# Patient Record
Sex: Female | Born: 1965 | Race: Black or African American | Hispanic: No | Marital: Single | Smoking: Current every day smoker
Health system: Southern US, Community
[De-identification: ages and names within clinical notes are randomized; demographics above are authoritative.]

## PROBLEM LIST (undated history)

## (undated) DIAGNOSIS — D869 Sarcoidosis, unspecified: Secondary | ICD-10-CM

## (undated) DIAGNOSIS — Z972 Presence of dental prosthetic device (complete) (partial): Secondary | ICD-10-CM

## (undated) DIAGNOSIS — R0602 Shortness of breath: Secondary | ICD-10-CM

## (undated) DIAGNOSIS — D860 Sarcoidosis of lung: Secondary | ICD-10-CM

## (undated) DIAGNOSIS — R002 Palpitations: Secondary | ICD-10-CM

## (undated) DIAGNOSIS — K219 Gastro-esophageal reflux disease without esophagitis: Secondary | ICD-10-CM

## (undated) DIAGNOSIS — E669 Obesity, unspecified: Secondary | ICD-10-CM

## (undated) DIAGNOSIS — K5900 Constipation, unspecified: Secondary | ICD-10-CM

## (undated) DIAGNOSIS — M1990 Unspecified osteoarthritis, unspecified site: Secondary | ICD-10-CM

## (undated) DIAGNOSIS — R600 Localized edema: Secondary | ICD-10-CM

## (undated) DIAGNOSIS — F32A Depression, unspecified: Secondary | ICD-10-CM

## (undated) DIAGNOSIS — J40 Bronchitis, not specified as acute or chronic: Secondary | ICD-10-CM

## (undated) DIAGNOSIS — J439 Emphysema, unspecified: Secondary | ICD-10-CM

## (undated) DIAGNOSIS — I10 Essential (primary) hypertension: Secondary | ICD-10-CM

## (undated) DIAGNOSIS — F329 Major depressive disorder, single episode, unspecified: Secondary | ICD-10-CM

## (undated) DIAGNOSIS — L732 Hidradenitis suppurativa: Secondary | ICD-10-CM

## (undated) DIAGNOSIS — K0889 Other specified disorders of teeth and supporting structures: Secondary | ICD-10-CM

## (undated) DIAGNOSIS — M549 Dorsalgia, unspecified: Secondary | ICD-10-CM

## (undated) DIAGNOSIS — J45909 Unspecified asthma, uncomplicated: Secondary | ICD-10-CM

## (undated) DIAGNOSIS — R112 Nausea with vomiting, unspecified: Secondary | ICD-10-CM

## (undated) DIAGNOSIS — L910 Hypertrophic scar: Secondary | ICD-10-CM

## (undated) DIAGNOSIS — M2550 Pain in unspecified joint: Secondary | ICD-10-CM

## (undated) DIAGNOSIS — E785 Hyperlipidemia, unspecified: Secondary | ICD-10-CM

## (undated) DIAGNOSIS — F419 Anxiety disorder, unspecified: Secondary | ICD-10-CM

## (undated) DIAGNOSIS — J449 Chronic obstructive pulmonary disease, unspecified: Secondary | ICD-10-CM

## (undated) DIAGNOSIS — G5600 Carpal tunnel syndrome, unspecified upper limb: Secondary | ICD-10-CM

## (undated) DIAGNOSIS — R1310 Dysphagia, unspecified: Secondary | ICD-10-CM

## (undated) DIAGNOSIS — R7303 Prediabetes: Secondary | ICD-10-CM

## (undated) DIAGNOSIS — Z9889 Other specified postprocedural states: Secondary | ICD-10-CM

## (undated) HISTORY — DX: Pain in unspecified joint: M25.50

## (undated) HISTORY — DX: Carpal tunnel syndrome, unspecified upper limb: G56.00

## (undated) HISTORY — DX: Gastro-esophageal reflux disease without esophagitis: K21.9

## (undated) HISTORY — DX: Major depressive disorder, single episode, unspecified: F32.9

## (undated) HISTORY — DX: Unspecified osteoarthritis, unspecified site: M19.90

## (undated) HISTORY — DX: Prediabetes: R73.03

## (undated) HISTORY — DX: Dorsalgia, unspecified: M54.9

## (undated) HISTORY — DX: Unspecified asthma, uncomplicated: J45.909

## (undated) HISTORY — DX: Bronchitis, not specified as acute or chronic: J40

## (undated) HISTORY — DX: Emphysema, unspecified: J43.9

## (undated) HISTORY — DX: Shortness of breath: R06.02

## (undated) HISTORY — DX: Sarcoidosis, unspecified: D86.9

## (undated) HISTORY — DX: Essential (primary) hypertension: I10

## (undated) HISTORY — DX: Hidradenitis suppurativa: L73.2

## (undated) HISTORY — DX: Constipation, unspecified: K59.00

## (undated) HISTORY — DX: Hyperlipidemia, unspecified: E78.5

## (undated) HISTORY — DX: Other specified disorders of teeth and supporting structures: K08.89

## (undated) HISTORY — PX: OTHER SURGICAL HISTORY: SHX169

## (undated) HISTORY — DX: Palpitations: R00.2

## (undated) HISTORY — DX: Localized edema: R60.0

## (undated) HISTORY — DX: Dysphagia, unspecified: R13.10

## (undated) HISTORY — DX: Sarcoidosis of lung: D86.0

## (undated) HISTORY — DX: Chronic obstructive pulmonary disease, unspecified: J44.9

## (undated) HISTORY — DX: Anxiety disorder, unspecified: F41.9

## (undated) HISTORY — DX: Hypertrophic scar: L91.0

## (undated) HISTORY — DX: Obesity, unspecified: E66.9

---

## 1998-07-18 ENCOUNTER — Emergency Department (HOSPITAL_COMMUNITY): Admission: EM | Admit: 1998-07-18 | Discharge: 1998-07-18 | Payer: Self-pay

## 2001-03-20 ENCOUNTER — Ambulatory Visit (HOSPITAL_COMMUNITY): Admission: RE | Admit: 2001-03-20 | Discharge: 2001-03-20 | Payer: Self-pay | Admitting: Family Medicine

## 2001-03-20 ENCOUNTER — Encounter: Payer: Self-pay | Admitting: Family Medicine

## 2001-04-04 ENCOUNTER — Encounter: Payer: Self-pay | Admitting: Neurology

## 2001-04-04 ENCOUNTER — Ambulatory Visit (HOSPITAL_COMMUNITY): Admission: RE | Admit: 2001-04-04 | Discharge: 2001-04-04 | Payer: Self-pay | Admitting: Neurology

## 2001-06-17 ENCOUNTER — Ambulatory Visit (HOSPITAL_COMMUNITY): Admission: RE | Admit: 2001-06-17 | Discharge: 2001-06-17 | Payer: Self-pay | Admitting: Neurology

## 2001-06-20 ENCOUNTER — Encounter: Admission: RE | Admit: 2001-06-20 | Discharge: 2001-09-18 | Payer: Self-pay | Admitting: Neurology

## 2001-06-24 ENCOUNTER — Ambulatory Visit (HOSPITAL_COMMUNITY): Admission: RE | Admit: 2001-06-24 | Discharge: 2001-06-24 | Payer: Self-pay | Admitting: Neurology

## 2001-06-24 ENCOUNTER — Encounter: Payer: Self-pay | Admitting: Neurology

## 2002-02-06 ENCOUNTER — Encounter: Payer: Self-pay | Admitting: Internal Medicine

## 2002-02-06 ENCOUNTER — Ambulatory Visit (HOSPITAL_COMMUNITY): Admission: RE | Admit: 2002-02-06 | Discharge: 2002-02-06 | Payer: Self-pay | Admitting: Family Medicine

## 2004-07-05 ENCOUNTER — Encounter: Admission: RE | Admit: 2004-07-05 | Discharge: 2004-07-05 | Payer: Self-pay | Admitting: Internal Medicine

## 2004-07-07 ENCOUNTER — Emergency Department (HOSPITAL_COMMUNITY): Admission: EM | Admit: 2004-07-07 | Discharge: 2004-07-07 | Payer: Self-pay | Admitting: Emergency Medicine

## 2005-10-23 ENCOUNTER — Emergency Department (HOSPITAL_COMMUNITY): Admission: EM | Admit: 2005-10-23 | Discharge: 2005-10-23 | Payer: Self-pay | Admitting: Emergency Medicine

## 2007-05-20 ENCOUNTER — Emergency Department (HOSPITAL_COMMUNITY): Admission: EM | Admit: 2007-05-20 | Discharge: 2007-05-20 | Payer: Self-pay | Admitting: Emergency Medicine

## 2007-07-10 ENCOUNTER — Ambulatory Visit (HOSPITAL_COMMUNITY): Admission: RE | Admit: 2007-07-10 | Discharge: 2007-07-10 | Payer: Self-pay | Admitting: *Deleted

## 2008-10-30 HISTORY — PX: MULTIPLE TOOTH EXTRACTIONS: SHX2053

## 2008-10-30 HISTORY — PX: OTHER SURGICAL HISTORY: SHX169

## 2009-06-29 ENCOUNTER — Encounter (INDEPENDENT_AMBULATORY_CARE_PROVIDER_SITE_OTHER): Payer: Self-pay | Admitting: Otolaryngology

## 2009-06-29 ENCOUNTER — Ambulatory Visit (HOSPITAL_BASED_OUTPATIENT_CLINIC_OR_DEPARTMENT_OTHER): Admission: RE | Admit: 2009-06-29 | Discharge: 2009-06-29 | Payer: Self-pay | Admitting: Otolaryngology

## 2009-06-29 HISTORY — PX: OPEN REDUCTION NASAL FRACTURE: SHX2105

## 2009-06-29 HISTORY — PX: NASAL TURBINATE REDUCTION: SHX2072

## 2010-06-22 ENCOUNTER — Encounter: Admission: RE | Admit: 2010-06-22 | Discharge: 2010-06-22 | Payer: Self-pay | Admitting: Internal Medicine

## 2010-07-13 ENCOUNTER — Encounter: Admission: RE | Admit: 2010-07-13 | Discharge: 2010-07-13 | Payer: Self-pay | Admitting: Internal Medicine

## 2010-11-20 ENCOUNTER — Encounter: Payer: Self-pay | Admitting: Internal Medicine

## 2011-02-04 LAB — BASIC METABOLIC PANEL
Calcium: 8.7 mg/dL (ref 8.4–10.5)
Creatinine, Ser: 1.14 mg/dL (ref 0.4–1.2)
GFR calc Af Amer: >60 mL/min (ref >60)
Sodium: 134 mEq/L — ABNORMAL LOW (ref 135–145)

## 2011-02-04 LAB — POCT HEMOGLOBIN-HEMACUE: Hemoglobin: 11.3 g/dL — ABNORMAL LOW (ref 12.0–15.0)

## 2011-03-14 NOTE — Op Note (Signed)
[name], [name]               ACCOUNT NO.:  [id number]   MEDICAL RECORD NO.:  [id number]          PATIENT TYPE:  AMB   LOCATION:  DSC                          FACILITY:  [facility]   PHYSICIAN:  Christopher E. Ezzard Standing, M.D.DATE OF BIRTH:  09-05-1966   DATE OF PROCEDURE:  06/29/2009  DATE OF DISCHARGE:                               OPERATIVE REPORT   PREOPERATIVE DIAGNOSES:  1. History of nasal fracture with nasal septal deformity and nasal      obstruction.  2. Left nasal cavity papilloma.   OPERATION PERFORMED:  1. Excisional biopsy of left intranasal papilloma.  2. Open reduction of nasal septal fracture and bilateral inferior      turbinate reductions.   ANESTHESIA:  General endotracheal.   SURGEON:  Kristine Garbe. Ezzard Standing, MD   COMPLICATIONS:  None.   CLINICAL NOTE:  Kaitlyn [name] is a 46 year old female who has had  history of nasal fracture over 15 years ago.  She has had a chronic  nasal obstruction but is concerned about a nodule in the left nasal  cavity that she felt has gotten larger.  On examination, she has slight  nasal septal deformity to the left with left-sided nasal obstruction  secondary to a deviation of the cartilaginous septum.  She also has a  small papillomatous lesion arising from the anteroinferior turbinate on  the left side.  She is taken to operating room at this time for excision  of left nasal cavity papillomatous lesion and open reduction of nasal  septal fracture along with inferior turbinate reductions to improve the  nasal airway and breathing.   DESCRIPTION OF PROCEDURE:  After adequate endotracheal anesthesia, the  patient received 1 g Ancef IV preoperatively.  Nose was then prepped  with cotton pledgets soaked in Afrin for decongesting the nose, and the  septum turbinates were injected with Xylocaine with epinephrine for  local hemostasis.  On examination, the patient had a protrusion of the  cartilaginous and bony septum to the left  airway.  In addition, she had  a very short cartilaginous septum, possibly from old fracture and septal  hematoma and loss of some of the cartilaginous septum.  She also had a  little papillomatous lesion arising from the lateral portion of the nose  off the anterior portion of the left inferior turbinate.  First, a  hemitransfixion incision was made along the caudal edge of the septum on  the left side.  Mucoperichondrial and periosteal flaps were elevated  posteriorly.  About a centimeter and half posterior to the anterior  incision, vertical incision was made through the cartilaginous septum,  and some of the cartilaginous septum and bony septum that protruded  through the left side was removed.  This allowed the anterior septum  removed more toward midline.  Following this, inferior turbinate  reductions were performed.  On the right side, an incision was made  along the inferior turbinate.  Medial turbinate mucosa was elevated off  the turbinate bone and then the lateral turbinate mucosa and turbinate  bone was amputated with scissors.  Remaining turbinate was outfractured  and  suction cautery was used for hemostasis.  On the left side, more of  the medial turbinate mucosa was amputated with the scissors along with  some turbinate bone.  The remaining turbinate was outfractured and  suction cautery was used for hemostasis.  This completed the turbinate  reductions.  At completion, because of the right nasal bone being  depressed slightly, a lateral osteotomy was performed on the right side  only, and using the osteotome the nasal bone was slightly outfractured.  This completed the procedure.  Hemitransfixion incision was closed with  interrupted 3-0 chromic suture, and the anterior septum was basted with  3-0 chromic suture.  Nose was then packed with Telfa, soaked in  bacitracin ointment bilaterally.  Keyondra was awakened from anesthesia and  transferred to the postop doing well.    DISPOSITION:  Kaitlyn [name] is discharged home later this morning on Keflex 500  mg b.i.d. for 1 week, Tylenol and Vicodin p.r.n. pain.  We will have her  follow up in my office in 2 days for recheck and have the nasal packs  removed.           ______________________________  Kristine Garbe Ezzard Standing, M.D.     CEN/MEDQ  D:  06/29/2009  T:  06/29/2009  Job:  [id number]   cc:   Halter Scrape, MD

## 2011-07-29 ENCOUNTER — Emergency Department (HOSPITAL_COMMUNITY)
Admission: EM | Admit: 2011-07-29 | Discharge: 2011-07-29 | Disposition: A | Discharge status: Home / Self Care | Payer: PRIVATE HEALTH INSURANCE | Attending: Emergency Medicine | Admitting: Emergency Medicine

## 2011-07-29 ENCOUNTER — Emergency Department (HOSPITAL_COMMUNITY): Payer: PRIVATE HEALTH INSURANCE

## 2011-07-29 DIAGNOSIS — K5900 Constipation, unspecified: Secondary | ICD-10-CM | POA: Insufficient documentation

## 2011-07-29 DIAGNOSIS — R109 Unspecified abdominal pain: Secondary | ICD-10-CM | POA: Insufficient documentation

## 2011-07-29 LAB — CBC
HCT: 42.4 % (ref 36.0–46.0)
Hemoglobin: 13.9 g/dL (ref 12.0–15.0)
MCHC: 32.8 g/dL (ref 30.0–36.0)

## 2011-07-29 LAB — DIFFERENTIAL
Basophils Absolute: 0 10*3/uL (ref 0.0–0.1)
Lymphocytes Relative: 17 % (ref 12–46)
Lymphs Abs: 1.8 10*3/uL (ref 0.7–4.0)
Monocytes Absolute: 0.8 10*3/uL (ref 0.1–1.0)
Neutro Abs: 7.8 10*3/uL — ABNORMAL HIGH (ref 1.7–7.7)

## 2011-07-29 LAB — COMPREHENSIVE METABOLIC PANEL
ALT: 19 U/L (ref 0–35)
AST: 20 U/L (ref 0–37)
Albumin: 3.2 g/dL — ABNORMAL LOW (ref 3.5–5.2)
Calcium: 9.5 mg/dL (ref 8.4–10.5)
GFR calc Af Amer: >60 mL/min (ref >60)
Sodium: 135 mEq/L (ref 135–145)
Total Protein: 8 g/dL (ref 6.0–8.3)

## 2011-07-29 LAB — HCG, QUANTITATIVE, PREGNANCY: hCG, Beta Chain, Quant, S: 1 m[IU]/mL (ref <5)

## 2012-01-19 ENCOUNTER — Ambulatory Visit
Admission: RE | Admit: 2012-01-19 | Discharge: 2012-01-19 | Disposition: A | Discharge status: Home / Self Care | Payer: PRIVATE HEALTH INSURANCE | Source: Ambulatory Visit | Attending: Internal Medicine | Admitting: Internal Medicine

## 2012-01-19 ENCOUNTER — Other Ambulatory Visit: Payer: Self-pay | Admitting: Internal Medicine

## 2012-01-19 DIAGNOSIS — R0989 Other specified symptoms and signs involving the circulatory and respiratory systems: Secondary | ICD-10-CM

## 2012-01-22 ENCOUNTER — Other Ambulatory Visit: Payer: Self-pay | Admitting: Internal Medicine

## 2012-01-22 DIAGNOSIS — R911 Solitary pulmonary nodule: Secondary | ICD-10-CM

## 2012-01-23 ENCOUNTER — Ambulatory Visit
Admission: RE | Admit: 2012-01-23 | Discharge: 2012-01-23 | Disposition: A | Discharge status: Home / Self Care | Payer: PRIVATE HEALTH INSURANCE | Source: Ambulatory Visit | Attending: Internal Medicine | Admitting: Internal Medicine

## 2012-01-23 DIAGNOSIS — R911 Solitary pulmonary nodule: Secondary | ICD-10-CM

## 2012-01-23 MED ORDER — IOHEXOL 300 MG/ML  SOLN
75.0000 mL | Freq: Once | INTRAMUSCULAR | Status: AC | PRN
  Administered 2012-01-23: 75 mL via INTRAVENOUS

## 2012-02-14 ENCOUNTER — Institutional Professional Consult (permissible substitution): Payer: PRIVATE HEALTH INSURANCE | Admitting: Internal Medicine

## 2012-02-22 ENCOUNTER — Ambulatory Visit (INDEPENDENT_AMBULATORY_CARE_PROVIDER_SITE_OTHER): Payer: PRIVATE HEALTH INSURANCE | Admitting: Pulmonary Disease

## 2012-02-22 ENCOUNTER — Encounter: Payer: Self-pay | Admitting: Pulmonary Disease

## 2012-02-22 VITALS — BP 118/76 | HR 86 | Temp 98.6°F | Ht 67.0 in | Wt 282.6 lb

## 2012-02-22 DIAGNOSIS — R599 Enlarged lymph nodes, unspecified: Secondary | ICD-10-CM

## 2012-02-22 DIAGNOSIS — D860 Sarcoidosis of lung: Secondary | ICD-10-CM

## 2012-02-22 DIAGNOSIS — R590 Localized enlarged lymph nodes: Secondary | ICD-10-CM

## 2012-02-22 HISTORY — DX: Sarcoidosis of lung: D86.0
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
## 2012-02-22 NOTE — Assessment & Plan Note (Addendum)
She has mediastinal and hilar adenopathy with scattered bilateral pulmonary nodules on CT chest.  This is most suggestive of sarcoidosis, but other disorders are also consider in the differential.   To further assess she will need tissue sampling before starting any therapy.  I have recommended endobronchial ultrasound as best initial diagnostic approach.  I have discussed possible side effects from procedure, including bleeding, infection, pneumothorax, and non-diagnosis.  Will also arrange for PFT to further assess her respiratory mechanics.  Case has been reviewed with my partner Dr. Delton Coombes who will contact patient to arrange for EBUS.

## 2012-02-22 NOTE — Progress Notes (Deleted)
  Subjective:    Patient ID: Kaitlyn [name], female    DOB: 01-13-1966, 46 y.o.   MRN: [id number]  HPI    Review of Systems  Constitutional: Positive for unexpected weight change. Negative for fever.  HENT: Positive for sinus pressure. Negative for ear pain, nosebleeds, congestion, sore throat, rhinorrhea, sneezing, trouble swallowing, dental problem and postnasal drip.   Eyes: Negative for redness and itching.  Respiratory: Positive for cough, chest tightness, shortness of breath and wheezing.   Cardiovascular: Positive for chest pain. Negative for palpitations and leg swelling.  Gastrointestinal: Negative for nausea and vomiting.  Genitourinary: Negative for dysuria.  Musculoskeletal: Negative for joint swelling.  Skin: Negative for rash.  Neurological: Negative for headaches.  Hematological: Bruises/bleeds easily.  Psychiatric/Behavioral: Negative for dysphoric mood. The patient is nervous/anxious.        Objective:   Physical Exam        Assessment & Plan:

## 2012-02-22 NOTE — Patient Instructions (Signed)
Will arrange for PFT Will call to let you know when bronchoscopy is scheduled Follow up in 3 weeks

## 2012-02-28 ENCOUNTER — Encounter (HOSPITAL_COMMUNITY): Payer: Self-pay | Admitting: Pharmacy Technician

## 2012-02-29 ENCOUNTER — Telehealth: Payer: Self-pay | Admitting: Pulmonary Disease

## 2012-02-29 ENCOUNTER — Encounter (HOSPITAL_COMMUNITY): Payer: Self-pay

## 2012-02-29 ENCOUNTER — Encounter: Payer: Self-pay | Admitting: *Deleted

## 2012-02-29 ENCOUNTER — Encounter (HOSPITAL_COMMUNITY)
Admission: RE | Admit: 2012-02-29 | Discharge: 2012-02-29 | Disposition: A | Discharge status: Home / Self Care | Payer: PRIVATE HEALTH INSURANCE | Source: Ambulatory Visit | Attending: Emergency Medicine | Admitting: Emergency Medicine

## 2012-02-29 HISTORY — DX: Other specified postprocedural states: Z98.890

## 2012-02-29 HISTORY — DX: Major depressive disorder, single episode, unspecified: F32.9

## 2012-02-29 HISTORY — DX: Depression, unspecified: F32.A

## 2012-02-29 HISTORY — DX: Nausea with vomiting, unspecified: R11.2

## 2012-02-29 HISTORY — DX: Shortness of breath: R06.02

## 2012-02-29 LAB — COMPREHENSIVE METABOLIC PANEL
ALT: 17 U/L (ref 0–35)
AST: 25 U/L (ref 0–37)
Alkaline Phosphatase: 103 U/L (ref 39–117)
CO2: 30 mEq/L (ref 19–32)
Calcium: 9.1 mg/dL (ref 8.4–10.5)
Potassium: 3.8 mEq/L (ref 3.5–5.1)
Sodium: 137 mEq/L (ref 135–145)
Total Protein: 7.5 g/dL (ref 6.0–8.3)

## 2012-02-29 LAB — SURGICAL PCR SCREEN
MRSA, PCR: POSITIVE — AB
Staphylococcus aureus: POSITIVE — AB

## 2012-02-29 LAB — CBC
MCH: 30 pg (ref 26.0–34.0)
MCHC: 32.9 g/dL (ref 30.0–36.0)
Platelets: 236 10*3/uL (ref 150–400)
RBC: 4.23 MIL/uL (ref 3.87–5.11)

## 2012-02-29 LAB — HCG, SERUM, QUALITATIVE: Preg, Serum: NEGATIVE

## 2012-02-29 LAB — APTT: aPTT: 32 seconds (ref 24–37)

## 2012-02-29 NOTE — Telephone Encounter (Signed)
Okay to complete letter giving her work absence from today through Sunday May 5.

## 2012-02-29 NOTE — Telephone Encounter (Signed)
I spoke with Kaitlyn [name] and she is requesting a work note for today through Sunday to be out of work since she is having procedure done by [facility] tomorrow and was told she could not do any strenuous labor at work so she took the weekend off. Kaitlyn [name] is requesting to pick this letter up today or she is wanting to know if she can get a letter after the procedure tomorrow from the hospital. Please advise VS thanks

## 2012-02-29 NOTE — Telephone Encounter (Signed)
Pt is aware letter was placed upfront for pick up. Nothing further was needed

## 2012-02-29 NOTE — Consult Note (Signed)
Anesthesia Chart Review:  Patient is a 46 year old female scheduled for video fiberoptic bronchoscopy with EBUS on 03/01/12 for mediastinal and hilar adenopathy suggestive of Sarcoidosis.  Other history includes former smoker, morbid obesity with BMI 44.67, hidradenitis suppurativa (on chronic Keflex), HLD, anxiety, SOB, HTN, asthma, nasal sinus surgery.  PCP is Dr. Bufford Spikes in [place]. Primary pulmonologist is Dr. Craige Cotta.     CXR on 01/19/12 showed: Nodule in the right upper lobe and bilateral hilar adenopathy.  This could represent malignancy. Sarcoidosis can also give this appearance. CT scan of the chest with contrast may be useful for further evaluation. Bronchitic changes.  CT scan of the chest on 01/23/12 showed Lung findings and mediastinal/hilar adenopathy are most consistent  with sarcoidosis.   CBC and CMET noted.  EKG shows NSR, non-specific ICVD, cannot rule out septal infarct.  QRS is now wider and now with poor r wave progression.    I reviewed EKGs and history with Anesthesiologist Dr. Ivin Booty.  If no worrisome CV symptoms then plan to proceed.  Shonna Chock, PA-C

## 2012-02-29 NOTE — Pre-Procedure Instructions (Signed)
20 Kaitlyn [name]  02/29/2012   Your procedure is scheduled on:  Mar 01, 2012 @ 0730  Report to [facility] at 0530 AM.  Call this number if you have problems the morning of surgery: [phone]   Remember:   Do not eat food:After Midnight.  May have clear liquids: up to 4 Hours before arrival.  Clear liquids include soda, tea, black coffee, apple or grape juice, broth.  Take these medicines the morning of surgery with A SIP OF WATER: Tramadol (if needed), advair, wellbutrin, inhalers (if needed).   Do not wear jewelry, make-up or nail polish.  Do not wear lotions, powders, or perfumes.  Do not shave 48 hours prior to surgery.  Do not bring valuables to the hospital.  Contacts, dentures or bridgework may not be worn into surgery.  Leave suitcase in the car. After surgery it may be brought to your room.  For patients admitted to the hospital, checkout time is 11:00 AM the day of discharge.   Patients discharged the day of surgery will not be allowed to drive home.  Special Instructions: CHG Shower Use Special Wash: 1/2 bottle night before surgery and 1/2 bottle morning of surgery.   Please read over the following fact sheets that you were given: Pain Booklet, Coughing and Deep Breathing, MRSA Information and Surgical Site Infection Prevention

## 2012-02-29 NOTE — Progress Notes (Signed)
Primary Physician - Dr. Geraldo Docker Does not have Cardiologist. No echo, stress test or cardiac cath.

## 2012-02-29 NOTE — Telephone Encounter (Signed)
LMOM for pt TCB to get more info why she needs to be out of work

## 2012-02-29 NOTE — Telephone Encounter (Signed)
THIS IS RE: PENDING BIOPSY. Kaitlyn [name]

## 2012-03-01 ENCOUNTER — Encounter (HOSPITAL_COMMUNITY): Payer: Self-pay | Admitting: Certified Registered Nurse Anesthetist

## 2012-03-01 ENCOUNTER — Ambulatory Visit (HOSPITAL_COMMUNITY)
Admission: RE | Admit: 2012-03-01 | Discharge: 2012-03-01 | Disposition: A | Discharge status: Home / Self Care | Payer: PRIVATE HEALTH INSURANCE | Source: Ambulatory Visit | Attending: Emergency Medicine | Admitting: Emergency Medicine

## 2012-03-01 ENCOUNTER — Ambulatory Visit (HOSPITAL_COMMUNITY): Payer: PRIVATE HEALTH INSURANCE

## 2012-03-01 ENCOUNTER — Encounter (HOSPITAL_COMMUNITY)
Admission: RE | Disposition: A | Discharge status: Home / Self Care | Payer: Self-pay | Source: Ambulatory Visit | Attending: Emergency Medicine

## 2012-03-01 ENCOUNTER — Encounter (HOSPITAL_COMMUNITY): Payer: Self-pay | Admitting: *Deleted

## 2012-03-01 ENCOUNTER — Ambulatory Visit (HOSPITAL_COMMUNITY): Payer: PRIVATE HEALTH INSURANCE | Admitting: Vascular Surgery

## 2012-03-01 ENCOUNTER — Encounter (HOSPITAL_COMMUNITY): Payer: Self-pay | Admitting: Vascular Surgery

## 2012-03-01 DIAGNOSIS — I10 Essential (primary) hypertension: Secondary | ICD-10-CM | POA: Insufficient documentation

## 2012-03-01 DIAGNOSIS — R599 Enlarged lymph nodes, unspecified: Secondary | ICD-10-CM

## 2012-03-01 DIAGNOSIS — E785 Hyperlipidemia, unspecified: Secondary | ICD-10-CM | POA: Insufficient documentation

## 2012-03-01 DIAGNOSIS — R590 Localized enlarged lymph nodes: Secondary | ICD-10-CM

## 2012-03-01 DIAGNOSIS — D869 Sarcoidosis, unspecified: Secondary | ICD-10-CM | POA: Insufficient documentation

## 2012-03-01 DIAGNOSIS — R918 Other nonspecific abnormal finding of lung field: Secondary | ICD-10-CM | POA: Insufficient documentation

## 2012-03-01 DIAGNOSIS — Z01812 Encounter for preprocedural laboratory examination: Secondary | ICD-10-CM | POA: Insufficient documentation

## 2012-03-01 DIAGNOSIS — Z01810 Encounter for preprocedural cardiovascular examination: Secondary | ICD-10-CM | POA: Insufficient documentation

## 2012-03-01 DIAGNOSIS — E669 Obesity, unspecified: Secondary | ICD-10-CM | POA: Insufficient documentation

## 2012-03-01 HISTORY — PX: VIDEO BRONCHOSCOPY WITH ENDOBRONCHIAL ULTRASOUND: SHX6177

## 2012-03-01 SURGERY — BRONCHOSCOPY, WITH EBUS
Anesthesia: General | Wound class: Clean Contaminated

## 2012-03-01 MED ORDER — 0.9 % SODIUM CHLORIDE (POUR BTL) OPTIME
TOPICAL | Status: DC | PRN
  Administered 2012-03-01: 1000 mL

## 2012-03-01 MED ORDER — DEXAMETHASONE SODIUM PHOSPHATE 4 MG/ML IJ SOLN
INTRAMUSCULAR | Status: DC | PRN
  Administered 2012-03-01: 4 mg via INTRAVENOUS

## 2012-03-01 MED ORDER — PROPOFOL 10 MG/ML IV EMUL
INTRAVENOUS | Status: DC | PRN
  Administered 2012-03-01: 50 mg via INTRAVENOUS
  Administered 2012-03-01: 40 mg via INTRAVENOUS
  Administered 2012-03-01: 50 mg via INTRAVENOUS
  Administered 2012-03-01: 110 mg via INTRAVENOUS
  Administered 2012-03-01: 50 mg via INTRAVENOUS

## 2012-03-01 MED ORDER — MUPIROCIN 2 % EX OINT
TOPICAL_OINTMENT | CUTANEOUS | Status: AC
  Filled 2012-03-01: qty 22

## 2012-03-01 MED ORDER — ONDANSETRON HCL 4 MG/2ML IJ SOLN: 4.0000 mg | Freq: Once | INTRAMUSCULAR | Status: DC | PRN

## 2012-03-01 MED ORDER — ROCURONIUM BROMIDE 100 MG/10ML IV SOLN
INTRAVENOUS | Status: DC | PRN
  Administered 2012-03-01: 50 mg via INTRAVENOUS
  Administered 2012-03-01: 20 mg via INTRAVENOUS

## 2012-03-01 MED ORDER — NEOSTIGMINE METHYLSULFATE 1 MG/ML IJ SOLN
INTRAMUSCULAR | Status: DC | PRN
  Administered 2012-03-01: 3 mg via INTRAVENOUS

## 2012-03-01 MED ORDER — MIDAZOLAM HCL 5 MG/5ML IJ SOLN
INTRAMUSCULAR | Status: DC | PRN
  Administered 2012-03-01 (×2): 2 mg via INTRAVENOUS

## 2012-03-01 MED ORDER — FENTANYL CITRATE 0.05 MG/ML IJ SOLN
INTRAMUSCULAR | Status: DC | PRN
  Administered 2012-03-01: 150 ug via INTRAVENOUS

## 2012-03-01 MED ORDER — MUPIROCIN 2 % EX OINT
TOPICAL_OINTMENT | Freq: Two times a day (BID) | CUTANEOUS | Status: DC
  Administered 2012-03-01: 1 via NASAL

## 2012-03-01 MED ORDER — SCOPOLAMINE 1 MG/3DAYS TD PT72
MEDICATED_PATCH | TRANSDERMAL | Status: DC | PRN
  Administered 2012-03-01: 1.5 mg via TRANSDERMAL

## 2012-03-01 MED ORDER — LIDOCAINE HCL (CARDIAC) 20 MG/ML IV SOLN
INTRAVENOUS | Status: DC | PRN
  Administered 2012-03-01: 100 mg via INTRAVENOUS

## 2012-03-01 MED ORDER — GLYCOPYRROLATE 0.2 MG/ML IJ SOLN
INTRAMUSCULAR | Status: DC | PRN
  Administered 2012-03-01: 0.4 mg via INTRAVENOUS

## 2012-03-01 MED ORDER — ONDANSETRON HCL 4 MG/2ML IJ SOLN
INTRAMUSCULAR | Status: DC | PRN
  Administered 2012-03-01: 4 mg via INTRAVENOUS

## 2012-03-01 MED ORDER — HYDROMORPHONE HCL PF 1 MG/ML IJ SOLN: 0.2500 mg | INTRAMUSCULAR | Status: DC | PRN

## 2012-03-01 MED ORDER — MORPHINE SULFATE 4 MG/ML IJ SOLN: 0.0500 mg/kg | INTRAMUSCULAR | Status: DC | PRN

## 2012-03-01 MED ORDER — LACTATED RINGERS IV SOLN
INTRAVENOUS | Status: DC | PRN
  Administered 2012-03-01: 08:00:00 via INTRAVENOUS

## 2012-03-01 SURGICAL SUPPLY — 24 items
BRUSH CYTOL CELLEBRITY 1.5X140 (MISCELLANEOUS) IMPLANT
CANISTER SUCTION 2500CC (MISCELLANEOUS) ×2 IMPLANT
CLOTH BEACON ORANGE TIMEOUT ST (SAFETY) ×2 IMPLANT
CONT SPEC 4OZ CLIKSEAL STRL BL (MISCELLANEOUS) ×4 IMPLANT
COVER TABLE BACK 60X90 (DRAPES) ×2 IMPLANT
FORCEPS BIOP RJ4 1.8 (CUTTING FORCEPS) ×2 IMPLANT
FORCEPS BIOP SPYBITE 1.2X286 (FORCEP) ×2 IMPLANT
GLOVE ECLIPSE 6.5 STRL STRAW (GLOVE) ×2 IMPLANT
GLOVE SURG SIGNA 7.5 PF LTX (GLOVE) ×2 IMPLANT
KIT ROOM TURNOVER OR (KITS) ×2 IMPLANT
MARKER SKIN DUAL TIP RULER LAB (MISCELLANEOUS) ×2 IMPLANT
NEEDLE BIOPSY TRANSBRONCH 21G (NEEDLE) IMPLANT
NEEDLE SYS SONOTIP II EBUSTBNA (NEEDLE) ×6 IMPLANT
NEEDLE WANG 19GA 15MM 130CM (NEEDLE) ×2 IMPLANT
NS IRRIG 1000ML POUR BTL (IV SOLUTION) ×2 IMPLANT
OIL SILICONE PENTAX (PARTS (SERVICE/REPAIRS)) ×2 IMPLANT
PAD ARMBOARD 7.5X6 YLW CONV (MISCELLANEOUS) ×4 IMPLANT
SPONGE GAUZE 4X4 12PLY (GAUZE/BANDAGES/DRESSINGS) ×2 IMPLANT
SYR 20CC LL (SYRINGE) ×2 IMPLANT
SYR 20ML ECCENTRIC (SYRINGE) ×4 IMPLANT
SYR 5ML LUER SLIP (SYRINGE) ×2 IMPLANT
TOWEL OR 17X24 6PK STRL BLUE (TOWEL DISPOSABLE) ×2 IMPLANT
TRAP SPECIMEN MUCOUS 40CC (MISCELLANEOUS) ×2 IMPLANT
TUBE CONNECTING 12X1/4 (SUCTIONS) ×2 IMPLANT

## 2012-03-01 NOTE — Interval H&P Note (Signed)
PCCM Interval Note  Reviewed hx with the patient, no new issues. 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   No new medical issues.  Filed Vitals:   03/01/12 0624  BP: 148/86  Pulse: 90  Temp: 98.6 F (37 C)  Resp: 18   We will proceed w EBUS FOB this am  Levy Pupa, MD, PhD 03/01/2012, 7:27 AM [facility] [phone] or if no answer [phone]
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
## 2012-03-01 NOTE — Op Note (Signed)
Video Bronchoscopy with Endobronchial Ultrasound Procedure Note  Date of Operation: 03/01/2012  Pre-op Diagnosis: mediastinal LAD and B UL infiltrates  Post-op Diagnosis: Same  Surgeon: Levy Pupa  Assistants: none  Anesthesia: General endotracheal anesthesia  Operation: Flexible video fiberoptic bronchoscopy with endobronchial ultrasound and biopsies.  Estimated Blood Loss: 30cc  Complications: None apparent  Indications and History: Kaitlyn [name] is a 46 y.o. 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 After reviewing the case, we recommended nodal bx by FOB + EBUS.  The risks, benefits, complications, treatment options and expected outcomes were discussed with the patient.  The possibilities of pneumothorax, pneumonia, reaction to medication, pulmonary aspiration, perforation of a viscus, bleeding, failure to diagnose a condition and creating a complication requiring transfusion or operation were discussed with the patient who freely signed the consent.    Description of Procedure: The patient was examined in the preoperative area and history and data from the preprocedure consultation were reviewed. It was deemed appropriate to proceed.  The patient was taken to OR7, identified as Kaitlyn [name] and the procedure verified as Flexible Video Fiberoptic Bronchoscopy.  A Time Out was held and the above information confirmed. After being taken to the operating room general anesthesia was initiated and the patient  was orally intubated. The video fiberoptic bronchoscope was introduced via the endotracheal tube and a general inspection was performed which showed Normal trachea and main carina. All of the airways were narrowed with thickened carinae. The scope would not pass into the LUL airway because it was narrowed. What could be visualized of the LUL and lingular airways appeared normal. There was an area of hypopigmentation noted in the LLL that was biopsied  with endobronchial forceps for pathology. Under fluoro guidance RUL transbronchial biopsies were performed for pathology. Finally a LUL BAL was performed to be sent for cytology and microbiology. The standard scope was then withdrawn and the endobronchial ultrasound was used to identify and characterize the peritracheal, hilar and bronchial lymph nodes. Inspection showed significant enlargement of subcarinal node (7) as well as 4R, 4L, 10R and 11 R. Using real-time ultrasound guidance Wang needle biopsies were take from Station 7, 4R, 10R, 11R  nodes and were sent for cytology. The patient tolerated the procedure well without apparent complications. There was approximately 20cc blood loss with good hemostasis at the end of the case. The bronchoscope was withdrawn. Anesthesia was reversed and the patient was taken to the PACU for recovery.   Samples: 1. Wang needle biopsies from 7 node 2. Wang needle biopsies from 4R node 3. Wang needle biopsies from 10R node 4. Wang needle biopsies from 11R node 5. Endobronchial biopsies from LLL 6. Transbronchial biopsies from RUL 7. BAL from the LUL  Plans:  The patient will be discharged from the PACU to home when recovered from anesthesia. We will review the cytology, pathology and microbiology results with the patient when they become available. Outpatient followup will be with Dr Craige Cotta.    Levy Pupa, MD, PhD 03/01/2012, 10:27 AM [facility] [phone] or if no answer [phone]

## 2012-03-01 NOTE — Transfer of Care (Signed)
Immediate Anesthesia Transfer of Care Note  Patient: Kaitlyn [name]  Procedure(s) Performed: Procedure(s) (LRB): VIDEO BRONCHOSCOPY WITH ENDOBRONCHIAL ULTRASOUND (N/A)  Patient Location: PACU  Anesthesia Type: General  Level of Consciousness: awake, alert  and oriented  Airway & Oxygen Therapy: Patient Spontanous Breathing and Patient connected to face mask oxygen  Post-op Assessment: Report given to PACU RN, Post -op Vital signs reviewed and stable and Patient moving all extremities X 4  Post vital signs: Reviewed and stable  Complications: No apparent anesthesia complications

## 2012-03-01 NOTE — Discharge Instructions (Signed)
Bronchoscopy Care After These instructions give you information on caring for yourself after your procedure. Your doctor may also give you specific instructions. Call your doctor if you have any problems or questions after your procedure. HOME CARE  Do not eat or drink anything for 2 hours after your test. Your nose and throat was numbed by medicine. If you try to eat or drink before the medicine wears off, food or drink could go into your lungs.   For the rest of the first day, eat soft food and drink liquids slowly.   On the day after the test, you can go back to eating your usual food.   Do not drive or sign important papers the day of the test.   Take it easy for the next 2 days. Do not do any heavy work, exercise, or activities.   Only take medicine as told by your doctor. Do not take aspirin.   You may be drowsy for the next 24 hours.   You may see traces of blood in your spit for 1 to 2 days.  Finding out the results of your test Ask when your test results will be ready. Make sure you get your test results. GET HELP RIGHT AWAY IF:  You have breathing problems.   You have a bad sore throat for more than 1 week.   You see traces of blood in your spit for more than 3 days.   You start coughing up blood.   You have a temperature of 102 F (38.9 C) or higher.  MAKE SURE YOU:  Understand these instructions.   Will watch your condition.   Will get help right away if you are not doing well or get worse.  Document Released: 08/13/2009 Document Revised: 10/05/2011 Document Reviewed: 08/13/2009 [employer] Patient Information 2012 [employer]. 

## 2012-03-01 NOTE — Preoperative (Signed)
Beta Blockers   Reason not to administer Beta Blockers:Not Applicable 

## 2012-03-01 NOTE — Anesthesia Preprocedure Evaluation (Addendum)
Anesthesia Evaluation  Patient identified by MRN, date of birth, ID band Patient awake    Reviewed: Allergy & Precautions, H&P , NPO status , Patient's Chart, lab work & pertinent test results, reviewed documented beta blocker date and time   History of Anesthesia Complications (+) PONV  Airway Mallampati: I TM Distance: >3 FB     Dental  (+) Edentulous Upper and Dental Advisory Given,    Pulmonary shortness of breath and with exertion, asthma ,          Cardiovascular hypertension, Pt. on medications     Neuro/Psych Depression Carpal tunnel R great than L hand  Neuromuscular disease    GI/Hepatic negative GI ROS, Neg liver ROS,   Endo/Other  Morbid obesity  Renal/GU negative Renal ROS  negative genitourinary   Musculoskeletal  (+) Arthritis - (knees and hips), Osteoarthritis,    Abdominal   Peds negative pediatric ROS (+)  Hematology   Anesthesia Other Findings   Reproductive/Obstetrics negative OB ROS                           Anesthesia Physical Anesthesia Plan  ASA: III  Anesthesia Plan: General   Post-op Pain Management:    Induction: Intravenous  Airway Management Planned: Oral ETT  Additional Equipment:   Intra-op Plan:   Post-operative Plan: Extubation in OR  Informed Consent: I have reviewed the patients History and Physical, chart, labs and discussed the procedure including the risks, benefits and alternatives for the proposed anesthesia with the patient or authorized representative who has indicated his/her understanding and acceptance.     Plan Discussed with: CRNA and Surgeon  Anesthesia Plan Comments:        Anesthesia Quick Evaluation

## 2012-03-05 LAB — CULTURE, RESPIRATORY W GRAM STAIN

## 2012-03-07 NOTE — Anesthesia Postprocedure Evaluation (Signed)
Anesthesia Post Note  Patient: Kaitlyn [name]  Procedure(s) Performed: Procedure(s) (LRB): VIDEO BRONCHOSCOPY WITH ENDOBRONCHIAL ULTRASOUND (N/A)  Anesthesia type: general  Patient location: PACU  Post pain: Pain level controlled  Post assessment: Patient's Cardiovascular Status Stable  Last Vitals:  Filed Vitals:   03/01/12 1238  BP: 129/78  Pulse: 87  Temp: 36.8 C  Resp: 16    Post vital signs: Reviewed and stable  Level of consciousness: sedated  Complications: No apparent anesthesia complications

## 2012-03-13 ENCOUNTER — Encounter: Payer: Self-pay | Admitting: Pulmonary Disease

## 2012-03-13 ENCOUNTER — Ambulatory Visit (INDEPENDENT_AMBULATORY_CARE_PROVIDER_SITE_OTHER): Payer: PRIVATE HEALTH INSURANCE | Admitting: Pulmonary Disease

## 2012-03-13 VITALS — BP 122/72 | HR 86 | Temp 98.4°F | Ht 67.0 in | Wt 288.0 lb

## 2012-03-13 DIAGNOSIS — R590 Localized enlarged lymph nodes: Secondary | ICD-10-CM

## 2012-03-13 DIAGNOSIS — J4489 Other specified chronic obstructive pulmonary disease: Secondary | ICD-10-CM

## 2012-03-13 DIAGNOSIS — R599 Enlarged lymph nodes, unspecified: Secondary | ICD-10-CM

## 2012-03-13 DIAGNOSIS — J449 Chronic obstructive pulmonary disease, unspecified: Secondary | ICD-10-CM

## 2012-03-13 HISTORY — DX: Other specified chronic obstructive pulmonary disease: J44.89

## 2012-03-13 HISTORY — DX: Chronic obstructive pulmonary disease, unspecified: J44.9

## 2012-03-13 LAB — PULMONARY FUNCTION TEST

## 2012-03-13 MED ORDER — PREDNISONE 10 MG PO TABS: ORAL_TABLET | ORAL | Status: DC

## 2012-03-13 MED ORDER — LEVOFLOXACIN 500 MG PO TABS: 500.0000 mg | ORAL_TABLET | Freq: Every day | ORAL | Status: AC

## 2012-03-13 NOTE — Progress Notes (Signed)
PFT done today. 

## 2012-03-13 NOTE — Patient Instructions (Signed)
Levaquin 500 mg daily for 7 days Prednisone 10 mg pill>>4 pills for 2 days, 3 pills for 2 days, 2 pills for 2 days, 1 pill for 2 days, 1/2 pill for 2 days Stop keflex while taking levaquin Follow up in one month

## 2012-03-13 NOTE — Assessment & Plan Note (Signed)
EBUS and TBx results were negative/inconclusive.  AFB and fungal cultures are still pending, but smears are negative.  She did have Streptococcus pneumoniae in her BAL culture.  ?if she could have had reactive disease from infection.  Will give course of levaquin and prednisone.  Will then plan to repeat CT chest with contrast in June or July.  If findings persist, then will need to have thoracic surgery evaluation for mediastinoscopy.

## 2012-03-13 NOTE — Progress Notes (Signed)
Chief Complaint  Patient presents with  . Follow-up    w/ pft. Pt is wanting her bronch results-Breathing has been fine, occasioanl cough, wheezing w/ exertion    History of Present Illness: Kaitlyn [name] is a 46 y.o. female with possible sarcoidosis.  She his here to review her PFT and bronchoscopy results.  PFT shows moderate obstruction, mild restriction, moderate diffusion defect, and no brochodilator response.  Bronchocospy with EBUS and TBx from 03/01/12>>11R and 7 lymph nodes showed lymphocytes with crush artifact, TBx showed benign lung tissue, and culture showed Streptococcus pneumoniae.  AFB and Fungal cultures are pending, but smears were negative.  She still has occasional cough.  She was hoarse after bronchoscopy, but this has improved.  She denies fever, but still brings up sputum.  She denies hemoptysis.   Past Medical History  Diagnosis Date  . Hyperlipidemia     takes pravachol  . Obesity   . Anxiety   . Hidradenitis   . Solitary pulmonary nodule   . Osteoarthritis   . Varicose vein   . PONV (postoperative nausea and vomiting)   . Hypertension     takes medications daily  . Asthma     inhalers   . Shortness of breath     exertion  . Carpal tunnel syndrome     bilateral  . Depression     takes wellbutrin    Past Surgical History  Procedure Date  . Nasal sinus surgery 2011  . Other surgical history 2010    cyst removal from right middle finger    . Multiple tooth extractions 2010    Allergies  Allergen Reactions  . Codeine Other (See Comments)    Reaction unknown  . Cyclinex (Tetracycline Hcl) Other (See Comments)    Reaction unknown  . Morphine And Related Other (See Comments)    Reaction unknown    Physical Exam:  Blood pressure 122/72, pulse 86, temperature 98.4 F (36.9 C), temperature source Oral, height 5\' 7"  (1.702 m), weight 288 lb (130.636 kg), last menstrual period 02/18/2012, SpO2 98.00%.  Body mass index is 45.11  kg/(m^2).  Wt Readings from Last 2 Encounters:  03/13/12 288 lb (130.636 kg)  02/29/12 285 lb 4.4 oz (129.4 kg)    General - Obese  HEENT - PERRLA, EOMI, no sinus tenderness, MP 3, no oral exudate, no LAN  Cardiac - s1s2 regular, no murmur  Chest - decreased breath sounds, no wheeze/rales/dullness  Abdomen - soft, + bowel sounds, no organomegaly  Extremities - no e/c/c  Neurologic - normal strength, CN intact  Skin - no rashes  Psychiatric - normal mood, behavior  PFT 03/13/12>>FEV1 1.65 (57%), FEV1% 58, TLC 4.44 (79%), DLCO 62%, no BD  Assessment/Plan:  Outpatient Encounter Prescriptions as of 03/13/2012  Medication Sig Dispense Refill  . acetaminophen (TYLENOL) 500 MG tablet Take 500 mg by mouth every 6 (six) hours as needed. For pain      . albuterol (PROVENTIL HFA;VENTOLIN HFA) 108 (90 BASE) MCG/ACT inhaler Inhale 2 puffs into the lungs 2 (two) times daily as needed. For shortness of breath      . buPROPion (WELLBUTRIN SR) 150 MG 12 hr tablet Take 150 mg by mouth 2 (two) times daily.      . calcium carbonate (TUMS - DOSED IN MG ELEMENTAL CALCIUM) 500 MG chewable tablet Chew 1 tablet by mouth daily as needed. For indigestion      . cephALEXin (KEFLEX) 500 MG capsule Take 500 mg by mouth 2 (  two) times daily.      . clindamycin (CLEOCIN T) 1 % lotion Apply 1 application topically 2 (two) times daily.       . Eflornithine HCl (VANIQA) 13.9 % cream Apply topically 2 (two) times daily with a meal.      . Fluticasone-Salmeterol (ADVAIR) 250-50 MCG/DOSE AEPB Inhale 1 puff into the lungs every 12 (twelve) hours.      Marland Kitchen ibuprofen (ADVIL,MOTRIN) 800 MG tablet Take 800 mg by mouth 4 (four) times daily as needed. For pain      . ipratropium-albuterol (DUONEB) 0.5-2.5 (3) MG/3ML SOLN Take 3 mLs by nebulization 2 (two) times daily as needed. For shortness of breath      . losartan-hydrochlorothiazide (HYZAAR) 100-12.5 MG per tablet Take 1 tablet by mouth daily.      . magnesium citrate 1.745  GM/30ML SOLN Take 1 Bottle by mouth daily as needed. For constipation      . montelukast (SINGULAIR) 10 MG tablet Take 10 mg by mouth at bedtime.       . pravastatin (PRAVACHOL) 40 MG tablet Take 40 mg by mouth daily.      Marland Kitchen tiotropium (SPIRIVA) 18 MCG inhalation capsule Place 18 mcg into inhaler and inhale daily.      . traMADol (ULTRAM) 50 MG tablet Take 50 mg by mouth 4 (four) times daily as needed. For pain        Nataliah Hatlestad Pager:  [phone] 03/13/2012, 10:51 AM

## 2012-03-15 ENCOUNTER — Encounter: Payer: Self-pay | Admitting: Pulmonary Disease

## 2012-03-15 NOTE — Assessment & Plan Note (Signed)
She has obstructive lung disease on PFT.  She is to continue her current inhaler regimen.  Will re-assess after treatment with prednisone and antibiotics.

## 2012-03-27 LAB — FUNGUS CULTURE W SMEAR: Fungal Smear: NONE SEEN

## 2012-04-15 ENCOUNTER — Encounter: Payer: Self-pay | Admitting: Pulmonary Disease

## 2012-04-15 ENCOUNTER — Ambulatory Visit (INDEPENDENT_AMBULATORY_CARE_PROVIDER_SITE_OTHER): Payer: PRIVATE HEALTH INSURANCE | Admitting: Pulmonary Disease

## 2012-04-15 VITALS — BP 122/84 | HR 93 | Temp 98.7°F | Ht 66.0 in | Wt 294.4 lb

## 2012-04-15 DIAGNOSIS — J449 Chronic obstructive pulmonary disease, unspecified: Secondary | ICD-10-CM

## 2012-04-15 DIAGNOSIS — R599 Enlarged lymph nodes, unspecified: Secondary | ICD-10-CM

## 2012-04-15 DIAGNOSIS — R590 Localized enlarged lymph nodes: Secondary | ICD-10-CM

## 2012-04-15 LAB — AFB CULTURE WITH SMEAR (NOT AT ARMC)

## 2012-04-15 NOTE — Assessment & Plan Note (Signed)
She is to continue spiriva, advair, and singulair for now.

## 2012-04-15 NOTE — Patient Instructions (Signed)
Will schedule CT chest for July 2013 and call with results Follow up in 8 weeks

## 2012-04-15 NOTE — Assessment & Plan Note (Signed)
She completed course of antibiotics and prednisone taper.  She continues to have respiratory symptoms.  Will repeat CT chest with contrast and then determine what additional interventions are needed.

## 2012-04-15 NOTE — Progress Notes (Signed)
Chief Complaint  Patient presents with  . Follow-up    breathing is good, has very little dry cough, wheezing, very little chest tx.    History of Present Illness: Kaitlyn [name] is a 46 y.o. female with possible sarcoidosis.  She completed course of prednisone and levaquin.  This helped some.  She still has wheeze, chest congestion, and cough.  She is not bringing up much sputum.  She denies fever, or skin rash.  She is using albuterol 3 or 4 times per week.   Past Medical History  Diagnosis Date  . Hyperlipidemia     takes pravachol  . Obesity   . Anxiety   . Hidradenitis   . Solitary pulmonary nodule   . Osteoarthritis   . Varicose vein   . PONV (postoperative nausea and vomiting)   . Hypertension     takes medications daily  . Asthma     inhalers   . Shortness of breath     exertion  . Carpal tunnel syndrome     bilateral  . Depression     takes wellbutrin    Past Surgical History  Procedure Date  . Nasal sinus surgery 2011  . Other surgical history 2010    cyst removal from right middle finger    . Multiple tooth extractions 2010  . Bronchoscopy 03/01/12    Allergies  Allergen Reactions  . Codeine Other (See Comments)    Reaction unknown  . Cyclinex (Tetracycline Hcl) Other (See Comments)    Reaction unknown  . Morphine And Related Other (See Comments)    Reaction unknown    Physical Exam:  Blood pressure 122/84, pulse 93, temperature 98.7 F (37.1 C), temperature source Oral, height 5\' 6"  (1.676 m), weight 294 lb 6.4 oz (133.539 kg), SpO2 97.00%.  Body mass index is 47.52 kg/(m^2).  Wt Readings from Last 2 Encounters:  04/15/12 294 lb 6.4 oz (133.539 kg)  03/13/12 288 lb (130.636 kg)    General - Obese  HEENT - PERRLA, EOMI, no sinus tenderness, MP 3, no oral exudate, no LAN  Cardiac - s1s2 regular, no murmur  Chest - decreased breath sounds, no wheeze/rales/dullness  Abdomen - soft, + bowel sounds, no organomegaly  Extremities - no e/c/c    Neurologic - normal strength, CN intact  Skin - no rashes  Psychiatric - normal mood, behavior    Assessment/Plan:  Outpatient Encounter Prescriptions as of 04/15/2012  Medication Sig Dispense Refill  . acetaminophen (TYLENOL) 500 MG tablet Take 500 mg by mouth every 6 (six) hours as needed. For pain      . albuterol (PROVENTIL HFA;VENTOLIN HFA) 108 (90 BASE) MCG/ACT inhaler Inhale 2 puffs into the lungs 2 (two) times daily as needed. For shortness of breath      . buPROPion (WELLBUTRIN SR) 150 MG 12 hr tablet Take 150 mg by mouth 2 (two) times daily.      . calcium carbonate (TUMS - DOSED IN MG ELEMENTAL CALCIUM) 500 MG chewable tablet Chew 1 tablet by mouth daily as needed. For indigestion      . cephALEXin (KEFLEX) 500 MG capsule Take 500 mg by mouth 2 (two) times daily.      . clindamycin (CLEOCIN T) 1 % lotion Apply 1 application topically 2 (two) times daily.       . Eflornithine HCl (VANIQA) 13.9 % cream Apply topically 2 (two) times daily.       . Fluticasone-Salmeterol (ADVAIR) 250-50 MCG/DOSE AEPB Inhale 1 puff  into the lungs every 12 (twelve) hours.      Marland Kitchen ibuprofen (ADVIL,MOTRIN) 800 MG tablet Take 800 mg by mouth 4 (four) times daily as needed. For pain      . ipratropium-albuterol (DUONEB) 0.5-2.5 (3) MG/3ML SOLN Take 3 mLs by nebulization 2 (two) times daily as needed. For shortness of breath      . losartan-hydrochlorothiazide (HYZAAR) 100-12.5 MG per tablet Take 1 tablet by mouth daily.      . magnesium citrate 1.745 GM/30ML SOLN Take 1 Bottle by mouth daily as needed. For constipation      . montelukast (SINGULAIR) 10 MG tablet Take 10 mg by mouth at bedtime.       . pravastatin (PRAVACHOL) 40 MG tablet Take 40 mg by mouth daily.      Marland Kitchen tiotropium (SPIRIVA) 18 MCG inhalation capsule Place 18 mcg into inhaler and inhale daily.      . traMADol (ULTRAM) 50 MG tablet Take 50 mg by mouth 4 (four) times daily as needed. For pain      . DISCONTD: predniSONE (DELTASONE) 10 MG  tablet 4 pills for 2 days, 3 pills for 2 days, 1 pill for 2 days, 1/2 pill for 2 days  17 tablet  0    Kaylyn Garrow Pager:  [phone] 04/15/2012, 11:28 AM

## 2012-04-30 ENCOUNTER — Ambulatory Visit (INDEPENDENT_AMBULATORY_CARE_PROVIDER_SITE_OTHER)
Admission: RE | Admit: 2012-04-30 | Discharge: 2012-04-30 | Disposition: A | Discharge status: Home / Self Care | Payer: PRIVATE HEALTH INSURANCE | Source: Ambulatory Visit | Attending: Pulmonary Disease | Admitting: Pulmonary Disease

## 2012-04-30 ENCOUNTER — Telehealth: Payer: Self-pay | Admitting: Pulmonary Disease

## 2012-04-30 DIAGNOSIS — R599 Enlarged lymph nodes, unspecified: Secondary | ICD-10-CM

## 2012-04-30 DIAGNOSIS — R590 Localized enlarged lymph nodes: Secondary | ICD-10-CM

## 2012-04-30 DIAGNOSIS — D869 Sarcoidosis, unspecified: Secondary | ICD-10-CM

## 2012-04-30 MED ORDER — PREDNISONE 20 MG PO TABS: ORAL_TABLET | ORAL | Status: DC

## 2012-04-30 MED ORDER — IOHEXOL 300 MG/ML  SOLN
80.0000 mL | Freq: Once | INTRAMUSCULAR | Status: AC | PRN
  Administered 2012-04-30: 80 mL via INTRAVENOUS

## 2012-04-30 NOTE — Telephone Encounter (Signed)
lmomtcb x1 for pt 

## 2012-04-30 NOTE — Telephone Encounter (Signed)
Ct Chest W Contrast  04/30/2012  *RADIOLOGY REPORT*  Clinical Data: follow up mediastinal adenopathy  CT CHEST WITH CONTRAST  Technique:  Multidetector CT imaging of the chest was performed following the standard protocol during bolus administration of intravenous contrast.  Contrast: 80mL OMNIPAQUE IOHEXOL 300 MG/ML  SOLN  Comparison: 01/23/2012  Findings: Anterior mediastinal lymph node measures 1.4 cm, image 17.  Unchanged from previous exam.  Right paratracheal lymph node measures 1.3 cm, image 16.  Previously 1.5 cm.  There is a low right paratracheal lymph node which is stable measuring 1.1 cm, image 19.  No pericardial or pleural effusion.  Again identified are scattered nodular densities throughout both lungs.  These appear upper lobe predominant and have a perilymphatic and centrilobular distribution. No pulmonary mass or airspace consolidation identified.  There is no significant bronchiectasis or evidence of fibrosis.  Review of the visualized osseous structures is unremarkable. Limited imaging through the upper abdomen is unremarkable.  IMPRESSION:  1.  Stable CT of the chest. 2.  No change in the appearance of the mediastinal and hilar adenopathy and pulmonary nodularity.  Consistent with sarcoidosis.  Original Report Authenticated By: Rosealee Albee, M.D.    Left message on patient's voicemail.  Explained that CT findings are more consistent with sarcoidosis.  Advised she needs to restart prednisone 40 mg daily until her next ROV with me.  I have sent script to her pharmacy.  Will have my nurse call to ensure patient has received message, and is agreeable with plan to resume prednisone.  She needs ROV end of July 2013.

## 2012-05-01 NOTE — Telephone Encounter (Signed)
lmomtcb x 2  

## 2012-05-01 NOTE — Telephone Encounter (Signed)
Pt aware and appt set for Aug 19 per pt request. Carron Curie, CMA

## 2012-05-16 ENCOUNTER — Emergency Department (HOSPITAL_COMMUNITY)
Admission: EM | Admit: 2012-05-16 | Discharge: 2012-05-16 | Disposition: A | Discharge status: Home / Self Care | Payer: PRIVATE HEALTH INSURANCE | Attending: Emergency Medicine | Admitting: Emergency Medicine

## 2012-05-16 ENCOUNTER — Emergency Department (HOSPITAL_COMMUNITY): Payer: PRIVATE HEALTH INSURANCE

## 2012-05-16 ENCOUNTER — Encounter (HOSPITAL_COMMUNITY): Payer: Self-pay | Admitting: Family Medicine

## 2012-05-16 DIAGNOSIS — E669 Obesity, unspecified: Secondary | ICD-10-CM | POA: Insufficient documentation

## 2012-05-16 DIAGNOSIS — E785 Hyperlipidemia, unspecified: Secondary | ICD-10-CM | POA: Insufficient documentation

## 2012-05-16 DIAGNOSIS — J45909 Unspecified asthma, uncomplicated: Secondary | ICD-10-CM | POA: Insufficient documentation

## 2012-05-16 DIAGNOSIS — F411 Generalized anxiety disorder: Secondary | ICD-10-CM | POA: Insufficient documentation

## 2012-05-16 DIAGNOSIS — M8448XA Pathological fracture, other site, initial encounter for fracture: Secondary | ICD-10-CM | POA: Insufficient documentation

## 2012-05-16 DIAGNOSIS — I10 Essential (primary) hypertension: Secondary | ICD-10-CM | POA: Insufficient documentation

## 2012-05-16 DIAGNOSIS — Z87891 Personal history of nicotine dependence: Secondary | ICD-10-CM | POA: Insufficient documentation

## 2012-05-16 DIAGNOSIS — F329 Major depressive disorder, single episode, unspecified: Secondary | ICD-10-CM | POA: Insufficient documentation

## 2012-05-16 DIAGNOSIS — Z79899 Other long term (current) drug therapy: Secondary | ICD-10-CM | POA: Insufficient documentation

## 2012-05-16 DIAGNOSIS — M84479A Pathological fracture, unspecified toe(s), initial encounter for fracture: Secondary | ICD-10-CM

## 2012-05-16 DIAGNOSIS — F3289 Other specified depressive episodes: Secondary | ICD-10-CM | POA: Insufficient documentation

## 2012-05-16 MED ORDER — OXYCODONE-ACETAMINOPHEN 5-325 MG PO TABS: ORAL_TABLET | ORAL | Status: AC

## 2012-05-16 MED ORDER — OXYCODONE-ACETAMINOPHEN 5-325 MG PO TABS
1.0000 | ORAL_TABLET | Freq: Once | ORAL | Status: AC
  Administered 2012-05-16: 1 via ORAL
  Filled 2012-05-16: qty 1

## 2012-05-16 NOTE — ED Notes (Signed)
Stubbed toe on new eliptical machine.

## 2012-05-16 NOTE — ED Notes (Signed)
Pt reports stubbing her left pinky toe on elliptical machine at home. Reports not being able to ambulate due to pain. Toe is swollen.

## 2012-05-16 NOTE — ED Provider Notes (Signed)
History     CSN: [id number]  Arrival date & time 05/16/12  1657   First MD Initiated Contact with Patient 05/16/12 1731      Chief Complaint  Patient presents with  . Toe Injury    (Consider location/radiation/quality/duration/timing/severity/associated sxs/prior treatment) HPI  46 year old female in no acute distress complaining of pain to left fifth toe after stubbing it on an elliptical machine at home earlier in the afternoon. Denies numbness or paresthesia. Pain is exacerbated by weightbearing.  Past Medical History  Diagnosis Date  . Hyperlipidemia     takes pravachol  . Obesity   . Anxiety   . Hidradenitis   . Solitary pulmonary nodule   . Osteoarthritis   . Varicose vein   . PONV (postoperative nausea and vomiting)   . Hypertension     takes medications daily  . Asthma     inhalers   . Shortness of breath     exertion  . Carpal tunnel syndrome     bilateral  . Depression     takes wellbutrin    Past Surgical History  Procedure Date  . Nasal sinus surgery 2011  . Other surgical history 2010    cyst removal from right middle finger    . Multiple tooth extractions 2010  . Bronchoscopy 03/01/12    Family History  Problem Relation Age of Onset  . Cancer Maternal Aunt     mother's aunt  . Diabetes Maternal Aunt   . Anesthesia problems Neg Hx   . Hypotension Neg Hx   . Malignant hyperthermia Neg Hx   . Pseudochol deficiency Neg Hx     History  Substance Use Topics  . Smoking status: Former Smoker -- 1.0 packs/day for 35 years    Types: Cigarettes    Quit date: 01/22/2012  . Smokeless tobacco: Never Used   Comment: smokes an [employer] cig  . Alcohol Use: Yes     occasionally    OB History    Grav Para Term Preterm Abortions TAB SAB Ect Mult Living                  Review of Systems  Musculoskeletal: Positive for joint swelling.  Neurological: Negative for numbness.  All other systems reviewed and are negative.    Allergies    Codeine; Cyclinex; and Morphine and related  Home Medications   Current Outpatient Rx  Name Route Sig Dispense Refill  . ACETAMINOPHEN 500 MG PO TABS Oral Take 500 mg by mouth every 6 (six) hours as needed. For pain    . ALBUTEROL SULFATE HFA 108 (90 BASE) MCG/ACT IN AERS Inhalation Inhale 2 puffs into the lungs 2 (two) times daily as needed. For shortness of breath    . BUPROPION HCL ER (SR) 150 MG PO TB12 Oral Take 150 mg by mouth 2 (two) times daily.    Marland Kitchen CALCIUM CARBONATE ANTACID 500 MG PO CHEW Oral Chew 1 tablet by mouth daily as needed. For indigestion    . CEPHALEXIN 500 MG PO CAPS Oral Take 500 mg by mouth 2 (two) times daily.    Marland Kitchen CLINDAMYCIN PHOSPHATE 1 % EX LOTN Topical Apply 1 application topically 2 (two) times daily.     Marland Kitchen EFLORNITHINE HCL 13.9 % EX CREA Topical Apply topically 2 (two) times daily.     Marland Kitchen FLUTICASONE-SALMETEROL 250-50 MCG/DOSE IN AEPB Inhalation Inhale 1 puff into the lungs every 12 (twelve) hours.    . IBUPROFEN 800 MG PO TABS Oral  Take 800 mg by mouth 4 (four) times daily as needed. For pain    . IPRATROPIUM-ALBUTEROL 0.5-2.5 (3) MG/3ML IN SOLN Nebulization Take 3 mLs by nebulization 2 (two) times daily as needed. For shortness of breath    . LOSARTAN POTASSIUM-HCTZ 100-12.5 MG PO TABS Oral Take 1 tablet by mouth daily.    Marland Kitchen MAGNESIUM CITRATE 1.745 GM/30ML PO SOLN Oral Take 1 Bottle by mouth daily as needed. For constipation    . MONTELUKAST SODIUM 10 MG PO TABS Oral Take 10 mg by mouth at bedtime.     Marland Kitchen PRAVASTATIN SODIUM 40 MG PO TABS Oral Take 40 mg by mouth daily.    Marland Kitchen PREDNISONE 20 MG PO TABS  Two tablets daily 60 tablet 2  . TIOTROPIUM BROMIDE MONOHYDRATE 18 MCG IN CAPS Inhalation Place 18 mcg into inhaler and inhale daily.    . TRAMADOL HCL 50 MG PO TABS Oral Take 50 mg by mouth 4 (four) times daily as needed. For pain      BP 126/75  Pulse 89  Temp 98.7 F (37.1 C) (Oral)  Resp 20  SpO2 98%  LMP 04/30/2012  Physical Exam  Vitals  reviewed. Constitutional: She is oriented to person, place, and time. She appears well-developed and well-nourished. No distress.  HENT:  Head: Normocephalic.  Eyes: Conjunctivae and EOM are normal.  Cardiovascular: Normal rate.   Pulmonary/Chest: Effort normal.  Musculoskeletal: She exhibits edema and tenderness.       Mild swelling to left fifth toe with tenderness to palpation. Cap refill less than 2 seconds. Normal sensation  Neurological: She is alert and oriented to person, place, and time.  Psychiatric: She has a normal mood and affect.    ED Course  Procedures (including critical care time)  Labs Reviewed - No data to display Dg Toe 5th Left  05/16/2012  *RADIOLOGY REPORT*  Clinical Data: Left fifth digit injury  DG TOE 5TH LEFT  Comparison: None.  Findings:   There is mild widening of the proximal interphalangeal joint along the medial aspect.  Subtle cortical irregularity along the lateral aspect of the distal metaphysis of the proximal phalanx of the fifth digit.  IMPRESSION:  Concern for mild subluxation at the proximal interphalangeal joint and subtle fracture of the metaphysis of the proximal phalanx along the lateral border.  Original Report Authenticated By: Genevive Bi, M.D.     1. Metatarsal fracture, pathologic       MDM  Questionable mild subluxation to the proximal phalynx of left 5th toe. Will buddy tape, give surgical shoe and pain control with ortho follow-up. Pt states that she has crutches at home. Although patient is allergic to morphine derivative she states she has had Percocet in the past without adverse reaction. Patient states that she is not driving home. Cautioned her against driving or drinking alcohol when taking narcotic pain medication. Pt verbalized understanding and agrees with care plan. Outpatient follow-up and return precautions given.           Wynetta Emery, PA-C 05/16/12 1838

## 2012-05-16 NOTE — ED Notes (Signed)
Ortho tech at bedside 

## 2012-05-16 NOTE — ED Provider Notes (Signed)
Medical screening examination/treatment/procedure(s) were performed by non-physician practitioner and as supervising physician I was immediately available for consultation/collaboration.   Nat Christen, MD 05/16/12 [phone]

## 2012-06-17 ENCOUNTER — Ambulatory Visit: Payer: PRIVATE HEALTH INSURANCE | Admitting: Pulmonary Disease

## 2012-07-30 ENCOUNTER — Encounter: Payer: Self-pay | Admitting: Pulmonary Disease

## 2012-07-30 ENCOUNTER — Ambulatory Visit (INDEPENDENT_AMBULATORY_CARE_PROVIDER_SITE_OTHER): Payer: PRIVATE HEALTH INSURANCE | Admitting: Pulmonary Disease

## 2012-07-30 VITALS — BP 138/80 | HR 91 | Temp 98.7°F | Ht 66.0 in | Wt 299.4 lb

## 2012-07-30 DIAGNOSIS — J449 Chronic obstructive pulmonary disease, unspecified: Secondary | ICD-10-CM

## 2012-07-30 DIAGNOSIS — Z23 Encounter for immunization: Secondary | ICD-10-CM

## 2012-07-30 DIAGNOSIS — D869 Sarcoidosis, unspecified: Secondary | ICD-10-CM

## 2012-07-30 MED ORDER — PREDNISONE 20 MG PO TABS: ORAL_TABLET | ORAL | Status: DC

## 2012-07-30 NOTE — Addendum Note (Signed)
Addended by: Tommie Sams on: 07/30/2012 10:16 AM   Modules accepted: Orders

## 2012-07-30 NOTE — Progress Notes (Signed)
Chief Complaint  Patient presents with  . Follow-up    breathing is worse x last month, wheezing, dry cough but occasionally will get mucus up. no chest tx    History of Present Illness: Kaitlyn [name] is a 46 y.o. 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  She was feeling better after starting prednisone.  She missed a few doses over the past week, and noticed more cough and wheeze.  She feels like she has a tickle in her throat.  She is not bringing up sputum.  She gets wheeze more from her throat, than her chest.    She denies sinus congestion, fever, or skin rash.  She has been using her albuterol several times per day.  Tests: CT chest 01/23/12>>bulky mediastinal/hilar LAN.  Scattered ill-  defined upper lobe predominate perilymphatic and centrilobular nodules. PFT 03/13/12>>FEV1 1.65 (57%), FEV1% 58, TLC 4.44 (79%), DLCO 62%, no BD EBUS with Tbx 03/01/12>>Streptococcus pneumoniae in BAL, cytology/Tbx negative CT chest 04/30/12>>No change in the appearance of the mediastinal and hilar adenopathy and pulmonary nodularity 04/30/12 start prednisone 07/30/12 FeNO>>15 ppb  Past Medical History  Diagnosis Date  . Hyperlipidemia     takes pravachol  . Obesity   . Anxiety   . Hidradenitis   . Solitary pulmonary nodule   . Osteoarthritis   . Varicose vein   . PONV (postoperative nausea and vomiting)   . Hypertension     takes medications daily  . Asthma     inhalers   . Shortness of breath     exertion  . Carpal tunnel syndrome     bilateral  . Depression     takes wellbutrin    Past Surgical History  Procedure Date  . Nasal sinus surgery 2011  . Other surgical history 2010    cyst removal from right middle finger    . Multiple tooth extractions 2010  . Bronchoscopy 03/01/12    Allergies  Allergen Reactions  . Codeine Other (See Comments)    Reaction unknown  . Cyclinex (Tetracycline Hcl) Other (See Comments)    Reaction unknown  . Morphine And  Related Other (See Comments)    Reaction unknown    Physical Exam:  Blood pressure 138/80, pulse 91, temperature 98.7 F (37.1 C), temperature source Oral, height 5\' 6"  (1.676 m), weight 299 lb 6.4 oz (135.807 kg), SpO2 96.00%.  Body mass index is 48.32 kg/(m^2).  Wt Readings from Last 2 Encounters:  07/30/12 299 lb 6.4 oz (135.807 kg)  04/15/12 294 lb 6.4 oz (133.539 kg)    General - Obese  HEENT - No sinus tenderness, MP 3, no oral exudate, no LAN, wheeze over anterior neck  Cardiac - s1s2 regular, no murmur  Chest - decreased breath sounds, no wheeze/rales/dullness  Abdomen - soft, + bowel sounds, no organomegaly  Extremities - no e/c/c  Neurologic - normal strength, CN intact  Skin - no rashes  Psychiatric - normal mood, behavior    Assessment/Plan:  Outpatient Encounter Prescriptions as of 07/30/2012  Medication Sig Dispense Refill  . acetaminophen (TYLENOL) 500 MG tablet Take 500 mg by mouth every 6 (six) hours as needed. For pain      . albuterol (PROVENTIL HFA;VENTOLIN HFA) 108 (90 BASE) MCG/ACT inhaler Inhale 2 puffs into the lungs 2 (two) times daily as needed. For shortness of breath      . buPROPion (WELLBUTRIN SR) 150 MG 12 hr tablet Take 150 mg by mouth 2 (two) times daily.      Marland Kitchen  calcium carbonate (TUMS - DOSED IN MG ELEMENTAL CALCIUM) 500 MG chewable tablet Chew 1 tablet by mouth daily as needed. For indigestion      . cephALEXin (KEFLEX) 500 MG capsule Take 500 mg by mouth 2 (two) times daily.      . clindamycin (CLEOCIN T) 1 % lotion Apply 1 application topically 2 (two) times daily.       . Eflornithine HCl (VANIQA) 13.9 % cream Apply topically 2 (two) times daily.       . Fluticasone-Salmeterol (ADVAIR) 250-50 MCG/DOSE AEPB Inhale 1 puff into the lungs every 12 (twelve) hours.      Marland Kitchen ibuprofen (ADVIL,MOTRIN) 800 MG tablet Take 800 mg by mouth 4 (four) times daily as needed. For pain      . ipratropium-albuterol (DUONEB) 0.5-2.5 (3) MG/3ML SOLN Take 3 mLs by  nebulization 2 (two) times daily as needed. For shortness of breath      . losartan-hydrochlorothiazide (HYZAAR) 100-12.5 MG per tablet Take 1 tablet by mouth daily.      . magnesium citrate 1.745 GM/30ML SOLN Take 1 Bottle by mouth daily as needed. For constipation      . montelukast (SINGULAIR) 10 MG tablet Take 10 mg by mouth at bedtime.       . pravastatin (PRAVACHOL) 40 MG tablet Take 40 mg by mouth daily.      . predniSONE (DELTASONE) 20 MG tablet Two tablets daily  60 tablet  2  . tiotropium (SPIRIVA) 18 MCG inhalation capsule Place 18 mcg into inhaler and inhale daily.      . traMADol (ULTRAM) 50 MG tablet Take 50 mg by mouth 4 (four) times daily as needed. For pain        Avneet Ashmore Pager:  [phone] 07/30/2012, 9:37 AM

## 2012-07-30 NOTE — Assessment & Plan Note (Signed)
She has presumed sarcoidosis. Will decrease prednisone to 30 mg daily.  Will repeat her CT chest with contrast and HR cuts.  She will get her flu shot at work.  Will give her pneumonia shot today.

## 2012-07-30 NOTE — Assessment & Plan Note (Signed)
I think her current wheeze is related to upper airway irritation, likely form advair.  Her FeNO was normal.  She is to stop advair.  She is to continue spiriva, singulair, and prn albuterol for now.

## 2012-07-30 NOTE — Patient Instructions (Addendum)
Pneumonia shot today Will schedule CT chest and call with results Prednisone 20 mg pills>>take 1.5 pills daily Stop advair Follow up in 4 weeks

## 2012-07-31 ENCOUNTER — Ambulatory Visit (INDEPENDENT_AMBULATORY_CARE_PROVIDER_SITE_OTHER)
Admission: RE | Admit: 2012-07-31 | Discharge: 2012-07-31 | Disposition: A | Discharge status: Home / Self Care | Payer: PRIVATE HEALTH INSURANCE | Source: Ambulatory Visit | Attending: Pulmonary Disease | Admitting: Pulmonary Disease

## 2012-07-31 DIAGNOSIS — D869 Sarcoidosis, unspecified: Secondary | ICD-10-CM

## 2012-07-31 MED ORDER — IOHEXOL 300 MG/ML  SOLN
80.0000 mL | Freq: Once | INTRAMUSCULAR | Status: AC | PRN
  Administered 2012-07-31: 80 mL via INTRAVENOUS

## 2012-08-02 ENCOUNTER — Telehealth: Payer: Self-pay | Admitting: Pulmonary Disease

## 2012-08-02 NOTE — Telephone Encounter (Signed)
Ct Chest W Contrast  07/31/2012  *RADIOLOGY REPORT*  Clinical Data: Follow-up sarcoidosis.  Cough.  CT CHEST WITH CONTRAST  Technique:  Multidetector CT imaging of the chest was performed following the standard protocol during bolus administration of intravenous contrast.  Contrast: 80mL OMNIPAQUE IOHEXOL 300 MG/ML  SOLN  Comparison: Chest CT 03/26 1013  Findings: No axillary or supraclavicular lymphadenopathy. Bilateral hilar adenopathy greater on the right unchanged from prior .  Mild mediastinal lymphadenopathy is unchanged.  Exemplary lymph nodes include 12 mm right lower paratracheal lymph node unchanged.  16 mm right hilar lymph node unchanged.  12 mm prevascular lymph node unchanged.  Review of the lung parenchyma demonstrates subpleural nodularity in left and right upper lobes.  There is subtle peribronchial ground- glass opacities in the upper lobes. The is greater in the right upper lobe.  The left upper lobe ground-glass nodularity is improved from more remote CT of 01/23/2012.  resolution CT imaging demonstrates some central peribronchovascular thickening.  No bronchiectasis.  No architectural distortion.  No evidence of air trapping.  Limited view of the upper abdomen is unremarkable. No evidence of splenomegaly or adenopathy in the upper abdomen.  Limited view of the skeleton demonstrates degenerative spurring of the spine.  IMPRESSION:  1.  Stable exam chest. 2.  Mediastinal lymphadenopathy, subpleural nodularity, and mild central bronchovascular thickening is consistent with sarcoidosis with pulmonary and lymph node involvement. 3.  Stable peribronchial ground-glass opacities greater in the right upper lobe may represent an inflammatory process of the airways versus a manifestation of sarcoidosis.   Original Report Authenticated By: Genevive Bi, M.D.     Left message on pt's voicemail detailing results.  Explained that she has stable to slightly improved findings.  Advised her to call back to  office on Monday, 08/05/12 and leave contact number to discuss results in more detail.  Will forward to message to my nurse to be aware when pt calls back.

## 2012-08-07 ENCOUNTER — Encounter: Payer: Self-pay | Admitting: Pulmonary Disease

## 2012-08-07 NOTE — Telephone Encounter (Signed)
Discussed results with pt.  Explained she has partial improvement in CT chest findings.  Again discussed whether she should have additional biopsy attempts.  Strong suspicion this is still sarcoid, so deferred decision for additional biopsy attempts for now.  She is to continue prednisone 40 mg daily until next ROV.   She has also resumed advair.

## 2012-08-07 NOTE — Telephone Encounter (Signed)
lmomtcb x1 for pt 

## 2012-08-07 NOTE — Telephone Encounter (Signed)
Returning call.  [phone]

## 2012-08-15 ENCOUNTER — Other Ambulatory Visit: Payer: Self-pay | Admitting: Internal Medicine

## 2012-08-15 DIAGNOSIS — Z1231 Encounter for screening mammogram for malignant neoplasm of breast: Secondary | ICD-10-CM

## 2012-08-29 ENCOUNTER — Ambulatory Visit (INDEPENDENT_AMBULATORY_CARE_PROVIDER_SITE_OTHER)
Admission: RE | Admit: 2012-08-29 | Discharge: 2012-08-29 | Disposition: A | Discharge status: Home / Self Care | Payer: PRIVATE HEALTH INSURANCE | Source: Ambulatory Visit | Attending: Pulmonary Disease | Admitting: Pulmonary Disease

## 2012-08-29 ENCOUNTER — Ambulatory Visit (INDEPENDENT_AMBULATORY_CARE_PROVIDER_SITE_OTHER): Payer: PRIVATE HEALTH INSURANCE | Admitting: Pulmonary Disease

## 2012-08-29 ENCOUNTER — Encounter: Payer: Self-pay | Admitting: Pulmonary Disease

## 2012-08-29 VITALS — BP 160/76 | HR 93 | Temp 98.7°F | Ht 67.0 in | Wt 304.6 lb

## 2012-08-29 DIAGNOSIS — D869 Sarcoidosis, unspecified: Secondary | ICD-10-CM

## 2012-08-29 DIAGNOSIS — J449 Chronic obstructive pulmonary disease, unspecified: Secondary | ICD-10-CM

## 2012-08-29 NOTE — Patient Instructions (Signed)
Prednisone 20 mg pill>>1.5 pills daily for 2 weeks, and if okay then change to 1 pill daily until next visit Follow up in 6 weeks

## 2012-08-29 NOTE — Progress Notes (Signed)
Chief Complaint  Patient presents with  . Follow-up    breathing is okay--had lot of SOB last week, dry cough, wheezing, chest tx. she tried to walk and had to use rescue inhaler 9 times middle of this month.. have increase to using her duoneb 3 times per week    History of Present Illness: Kaitlyn [name] is a 46 y.o. 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  She has more trouble with her breathing last week.  This was associated with increased cough and wheezing.  She was not bringing up sputum.  She did not have fever, sweats, sore throat, sinus congestion, or abdominal pain.  She needed to use her inhalers more.  She is doing better this week.  She remains on prednisone 40 mg daily.  She is using spiriva and advair daily.   Tests: CT chest 01/23/12>>bulky mediastinal/hilar LAN.  Scattered ill-defined upper lobe predominate perilymphatic and centrilobular nodules. PFT 03/13/12>>FEV1 1.65 (57%), FEV1% 58, TLC 4.44 (79%), DLCO 62%, no BD EBUS with Tbx 03/01/12>>Streptococcus pneumoniae in BAL, cytology/Tbx negative CT chest 04/30/12>>No change in the appearance of the mediastinal and hilar adenopathy and pulmonary nodularity 04/30/12 start prednisone 07/30/12 FeNO>>15 ppb CT chest 07/31/12>>b/l hilar adenopathy no change, b/l upper lobe subpleural nodularity, improved LUL GGO  Past Medical History  Diagnosis Date  . Hyperlipidemia     takes pravachol  . Obesity   . Anxiety   . Hidradenitis   . Solitary pulmonary nodule   . Osteoarthritis   . Varicose vein   . PONV (postoperative nausea and vomiting)   . Hypertension     takes medications daily  . Asthma     inhalers   . Shortness of breath     exertion  . Carpal tunnel syndrome     bilateral  . Depression     takes wellbutrin    Past Surgical History  Procedure Date  . Nasal sinus surgery 2011  . Other surgical history 2010    cyst removal from right middle finger    . Multiple tooth extractions 2010    . Bronchoscopy 03/01/12    Current Outpatient Prescriptions on File Prior to Visit  Medication Sig Dispense Refill  . acetaminophen (TYLENOL) 500 MG tablet Take 500 mg by mouth every 6 (six) hours as needed. For pain      . albuterol (PROVENTIL HFA;VENTOLIN HFA) 108 (90 BASE) MCG/ACT inhaler Inhale 2 puffs into the lungs 2 (two) times daily as needed. For shortness of breath      . buPROPion (WELLBUTRIN SR) 150 MG 12 hr tablet Take 150 mg by mouth 2 (two) times daily.      . calcium carbonate (TUMS - DOSED IN MG ELEMENTAL CALCIUM) 500 MG chewable tablet Chew 1 tablet by mouth daily as needed. For indigestion      . cephALEXin (KEFLEX) 500 MG capsule Take 500 mg by mouth 2 (two) times daily.      . clindamycin (CLEOCIN T) 1 % lotion Apply 1 application topically 2 (two) times daily.       . Eflornithine HCl (VANIQA) 13.9 % cream Apply topically 2 (two) times daily as needed.       Marland Kitchen ibuprofen (ADVIL,MOTRIN) 800 MG tablet Take 800 mg by mouth 4 (four) times daily as needed. For pain      . ipratropium-albuterol (DUONEB) 0.5-2.5 (3) MG/3ML SOLN Take 3 mLs by nebulization 2 (two) times daily as needed. For shortness of breath      .  losartan-hydrochlorothiazide (HYZAAR) 100-12.5 MG per tablet Take 1 tablet by mouth daily.      . magnesium citrate 1.745 GM/30ML SOLN Take 1 Bottle by mouth daily as needed. For constipation      . montelukast (SINGULAIR) 10 MG tablet Take 10 mg by mouth at bedtime.       . pravastatin (PRAVACHOL) 40 MG tablet Take 40 mg by mouth daily.      . predniSONE (DELTASONE) 20 MG tablet Use as directed  60 tablet  2  . tiotropium (SPIRIVA) 18 MCG inhalation capsule Place 18 mcg into inhaler and inhale daily.      . traMADol (ULTRAM) 50 MG tablet Take 50 mg by mouth 4 (four) times daily as needed. For pain        Allergies  Allergen Reactions  . Codeine Other (See Comments)    Reaction unknown  . Cyclinex (Tetracycline Hcl) Other (See Comments)    Reaction unknown  .  Morphine And Related Other (See Comments)    Reaction unknown    Physical Exam:  Filed Vitals:   08/29/12 0940  BP: 160/76  Pulse: 93  Temp: 98.7 F (37.1 C)  TempSrc: Oral  Height: 5\' 7"  (1.702 m)  Weight: 304 lb 9.6 oz (138.166 kg)  SpO2: 98%    Body mass index is 47.71 kg/(m^2).   Wt Readings from Last 2 Encounters:  08/29/12 304 lb 9.6 oz (138.166 kg)  07/30/12 299 lb 6.4 oz (135.807 kg)    General - Obese  HEENT - No sinus tenderness, MP 3, no oral exudate, no LAN Cardiac - s1s2 regular, no murmur  Chest - decreased breath sounds, faint wheeze anterior Lt upper lobe partially clear with cough, no rales, no dullness Abdomen - soft, + bowel sounds Extremities - no e/c/c  Neurologic - normal strength Skin - no rashes  Psychiatric - normal mood, behavior  Dg Chest 2 View  08/29/2012  *RADIOLOGY REPORT*  Clinical Data: COPD, history of sarcoid  CHEST - 2 VIEW  Comparison: 03/01/2012 and 07/31/2012  Findings: Cardiomediastinal silhouette is unremarkable.  No acute infiltrate or pleural effusion.  No pulmonary edema.  Bony thorax is unremarkable.  IMPRESSION:  No active disease.   Original Report Authenticated By: Natasha Mead, M.D.       Assessment/Plan:  Reegan Mctighe Pager:  [phone] 08/29/2012, 10:34 AM

## 2012-08-29 NOTE — Assessment & Plan Note (Signed)
Chest xray looks better.  She is to continue prednisone taper as tolerated.  Will have her change to prednisone 30 mg daily for 2 weeks, and if okay then change to 20 mg daily until next visit in 6 weeks.

## 2012-08-29 NOTE — Assessment & Plan Note (Signed)
She likely had mild exacerbation last week.  She is improving.  She has faint expiratory wheeze Lt upper lobe today, but chest xray was unremarkable.  I don't think she needs antibiotics.  She is to continue her inhaler regimen.

## 2012-08-30 ENCOUNTER — Ambulatory Visit (HOSPITAL_COMMUNITY): Payer: PRIVATE HEALTH INSURANCE | Attending: Internal Medicine

## 2012-09-09 ENCOUNTER — Telehealth: Payer: Self-pay | Admitting: Pulmonary Disease

## 2012-09-09 MED ORDER — IPRATROPIUM-ALBUTEROL 0.5-2.5 (3) MG/3ML IN SOLN: 3.0000 mL | Freq: Two times a day (BID) | RESPIRATORY_TRACT | Status: DC | PRN

## 2012-09-09 NOTE — Telephone Encounter (Signed)
Pt notified that refill for Duoneb was sent to [facility] on [address].

## 2012-10-14 ENCOUNTER — Ambulatory Visit: Payer: PRIVATE HEALTH INSURANCE | Admitting: Pulmonary Disease

## 2012-10-15 ENCOUNTER — Ambulatory Visit: Payer: PRIVATE HEALTH INSURANCE | Admitting: Pulmonary Disease

## 2012-11-11 ENCOUNTER — Ambulatory Visit: Payer: PRIVATE HEALTH INSURANCE | Admitting: Pulmonary Disease

## 2012-11-14 ENCOUNTER — Ambulatory Visit (INDEPENDENT_AMBULATORY_CARE_PROVIDER_SITE_OTHER): Payer: PRIVATE HEALTH INSURANCE | Admitting: Pulmonary Disease

## 2012-11-14 ENCOUNTER — Encounter: Payer: Self-pay | Admitting: Pulmonary Disease

## 2012-11-14 VITALS — BP 130/70 | HR 119 | Temp 98.0°F | Ht 66.5 in | Wt 312.6 lb

## 2012-11-14 DIAGNOSIS — D869 Sarcoidosis, unspecified: Secondary | ICD-10-CM

## 2012-11-14 DIAGNOSIS — J449 Chronic obstructive pulmonary disease, unspecified: Secondary | ICD-10-CM

## 2012-11-14 MED ORDER — PREDNISONE 5 MG PO TABS: ORAL_TABLET | ORAL | Status: DC

## 2012-11-14 NOTE — Assessment & Plan Note (Signed)
She is to continue her inhaler regimen.

## 2012-11-14 NOTE — Assessment & Plan Note (Signed)
Will resume prednisone 20 mg daily, and then gradually taper to dose of 10 mg daily.  Will then reassess her symptoms and determine when she needs repeat CT chest.  Depending on her symptom status and f/u CT results will determine if she needs repeat biopsy attempt.

## 2012-11-14 NOTE — Patient Instructions (Signed)
Prednisone 20 mg daily for 2 weeks, then 15 mg daily for 2 weeks, then 10 mg daily until next visit Follow up in 6 weeks

## 2012-11-14 NOTE — Progress Notes (Signed)
Chief Complaint  Patient presents with  . Follow-up    breathing has been getting worse since stopping the prednisone last month. no chest tx, no wheezing, no cough    History of Present Illness: Kaitlyn [name] is a 47 y.o. 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  She was confused about her instructions.  She has been off prednisone for the past month.  She was unable to keep her appointment in December due to family commitments.  Her breathing has gotten worse off prednisone.  She is feeling more short of breath.  She does not have much cough, or wheeze.  She denies fever, rash, or hemoptysis.  She has been using her nebulizer more often.  Tests: CT chest 01/23/12>>bulky mediastinal/hilar LAN.  Scattered ill-defined upper lobe predominate perilymphatic and centrilobular nodules. PFT 03/13/12>>FEV1 1.65 (57%), FEV1% 58, TLC 4.44 (79%), DLCO 62%, no BD EBUS with Tbx 03/01/12>>Streptococcus pneumoniae in BAL, cytology/Tbx negative CT chest 04/30/12>>No change in the appearance of the mediastinal and hilar adenopathy and pulmonary nodularity 04/30/12 start prednisone 07/30/12 FeNO>>15 ppb CT chest 07/31/12>>b/l hilar adenopathy no change, b/l upper lobe subpleural nodularity, improved LUL GGO  Past Medical History  Diagnosis Date  . Hyperlipidemia     takes pravachol  . Obesity   . Anxiety   . Hidradenitis   . Solitary pulmonary nodule   . Osteoarthritis   . Varicose vein   . PONV (postoperative nausea and vomiting)   . Hypertension     takes medications daily  . Asthma     inhalers   . Shortness of breath     exertion  . Carpal tunnel syndrome     bilateral  . Depression     takes wellbutrin    Past Surgical History  Procedure Date  . Nasal sinus surgery 2011  . Other surgical history 2010    cyst removal from right middle finger    . Multiple tooth extractions 2010  . Bronchoscopy 03/01/12    Current Outpatient Prescriptions on File Prior to  Visit  Medication Sig Dispense Refill  . acetaminophen (TYLENOL) 500 MG tablet Take 500 mg by mouth every 6 (six) hours as needed. For pain      . albuterol (PROVENTIL HFA;VENTOLIN HFA) 108 (90 BASE) MCG/ACT inhaler Inhale 2 puffs into the lungs 2 (two) times daily as needed. For shortness of breath      . buPROPion (WELLBUTRIN SR) 150 MG 12 hr tablet Take 150 mg by mouth 2 (two) times daily.      . calcium carbonate (TUMS - DOSED IN MG ELEMENTAL CALCIUM) 500 MG chewable tablet Chew 1 tablet by mouth daily as needed. For indigestion      . cephALEXin (KEFLEX) 500 MG capsule Take 500 mg by mouth 2 (two) times daily.      . clindamycin (CLEOCIN T) 1 % lotion Apply 1 application topically 2 (two) times daily.       . Eflornithine HCl (VANIQA) 13.9 % cream Apply topically 2 (two) times daily as needed.       . Fluticasone-Salmeterol (ADVAIR) 250-50 MCG/DOSE AEPB Inhale 1 puff into the lungs every 12 (twelve) hours.      Marland Kitchen ibuprofen (ADVIL,MOTRIN) 800 MG tablet Take 800 mg by mouth 4 (four) times daily as needed. For pain      . ipratropium-albuterol (DUONEB) 0.5-2.5 (3) MG/3ML SOLN Take 3 mLs by nebulization 2 (two) times daily as needed. For shortness of breath  360 mL  3  .  losartan-hydrochlorothiazide (HYZAAR) 100-12.5 MG per tablet Take 1 tablet by mouth daily.      . magnesium citrate 1.745 GM/30ML SOLN Take 1 Bottle by mouth daily as needed. For constipation      . montelukast (SINGULAIR) 10 MG tablet Take 10 mg by mouth at bedtime.       . pravastatin (PRAVACHOL) 40 MG tablet Take 40 mg by mouth daily.      Marland Kitchen tiotropium (SPIRIVA) 18 MCG inhalation capsule Place 18 mcg into inhaler and inhale daily.      . traMADol (ULTRAM) 50 MG tablet Take 50 mg by mouth 4 (four) times daily as needed. For pain        Allergies  Allergen Reactions  . Codeine Other (See Comments)    Reaction unknown  . Cyclinex (Tetracycline Hcl) Other (See Comments)    Reaction unknown  . Morphine And Related Other (See  Comments)    Reaction unknown    Physical Exam:  Filed Vitals:   11/14/12 1031  BP: 130/70  Pulse: 119  Temp: 98 F (36.7 C)  TempSrc: Oral  Height: 5' 6.5" (1.689 m)  Weight: 312 lb 9.6 oz (141.794 kg)  SpO2: 94%    Body mass index is 49.70 kg/(m^2).   Wt Readings from Last 2 Encounters:  11/14/12 312 lb 9.6 oz (141.794 kg)  08/29/12 304 lb 9.6 oz (138.166 kg)    General - Obese  HEENT - No sinus tenderness, MP 3, no oral exudate, no LAN Cardiac - s1s2 regular, no murmur  Chest - decreased breath sounds, no wheeze, no rales, no dullness Abdomen - soft, + bowel sounds Extremities - no e/c/c  Neurologic - normal strength Skin - no rashes  Psychiatric - normal mood, behavior   Assessment/Plan:  Kaitlyn [name] Pager:  [phone] 11/14/2012, 10:43 AM

## 2012-12-17 ENCOUNTER — Encounter: Payer: Self-pay | Admitting: Internal Medicine

## 2012-12-24 ENCOUNTER — Ambulatory Visit (INDEPENDENT_AMBULATORY_CARE_PROVIDER_SITE_OTHER)
Admission: RE | Admit: 2012-12-24 | Discharge: 2012-12-24 | Disposition: A | Discharge status: Home / Self Care | Payer: PRIVATE HEALTH INSURANCE | Source: Ambulatory Visit | Attending: Pulmonary Disease | Admitting: Pulmonary Disease

## 2012-12-24 ENCOUNTER — Ambulatory Visit (INDEPENDENT_AMBULATORY_CARE_PROVIDER_SITE_OTHER): Payer: PRIVATE HEALTH INSURANCE | Admitting: Pulmonary Disease

## 2012-12-24 ENCOUNTER — Encounter: Payer: Self-pay | Admitting: Pulmonary Disease

## 2012-12-24 VITALS — BP 144/88 | HR 104 | Temp 97.0°F | Ht 66.5 in | Wt 301.2 lb

## 2012-12-24 DIAGNOSIS — D860 Sarcoidosis of lung: Secondary | ICD-10-CM

## 2012-12-24 DIAGNOSIS — J99 Respiratory disorders in diseases classified elsewhere: Secondary | ICD-10-CM

## 2012-12-24 DIAGNOSIS — J441 Chronic obstructive pulmonary disease with (acute) exacerbation: Secondary | ICD-10-CM

## 2012-12-24 DIAGNOSIS — J449 Chronic obstructive pulmonary disease, unspecified: Secondary | ICD-10-CM

## 2012-12-24 DIAGNOSIS — D869 Sarcoidosis, unspecified: Secondary | ICD-10-CM

## 2012-12-24 MED ORDER — ALBUTEROL SULFATE (2.5 MG/3ML) 0.083% IN NEBU
2.5000 mg | INHALATION_SOLUTION | Freq: Once | RESPIRATORY_TRACT | Status: AC
  Administered 2012-12-24: 2.5 mg via RESPIRATORY_TRACT

## 2012-12-24 MED ORDER — PREDNISONE 10 MG PO TABS: ORAL_TABLET | ORAL | Status: DC

## 2012-12-24 NOTE — Progress Notes (Signed)
Chief Complaint  Patient presents with  . Follow-up    Breathing has not been bad. Still having dry cough, chest tx yesterday, slight wheezing. no PND, no nasal congestion    History of Present Illness: Kaitlyn [name] is a 47 y.o. 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  She was doing well until about 2 weeks ago.  She has since been getting dry cough and occasional wheezing.  Her wheezing occurs more when she exerts herself.  She denies fever, sinus congestion, sore throat, post-nasal drip, skin rash, or gland swelling.  She only uses her nebulizer when she really needs to.  She is down to 10 mg prednisone per day.  She feels her breathing was doing much better until the last two weeks.  She continues to use advair and singulair.  Tests: CT chest 01/23/12>>bulky mediastinal/hilar LAN.  Scattered ill-defined upper lobe predominate perilymphatic and centrilobular nodules. PFT 03/13/12>>FEV1 1.65 (57%), FEV1% 58, TLC 4.44 (79%), DLCO 62%, no BD EBUS with Tbx 03/01/12>>Streptococcus pneumoniae in BAL, cytology/Tbx negative CT chest 04/30/12>>No change in the appearance of the mediastinal and hilar adenopathy and pulmonary nodularity 04/30/12 start prednisone 07/30/12 FeNO>>15 ppb CT chest 07/31/12>>b/l hilar adenopathy no change, b/l upper lobe subpleural nodularity, improved LUL GGO   has a past medical history of Hyperlipidemia; Obesity; Anxiety; Hidradenitis; Solitary pulmonary nodule; Osteoarthritis; Varicose vein; PONV (postoperative nausea and vomiting); Hypertension; Asthma; Shortness of breath; Carpal tunnel syndrome; and Depression.   has past surgical history that includes Nasal sinus surgery (2011); Other surgical history (2010); Multiple tooth extractions (2010); and Bronchoscopy (03/01/12).  Prior to Admission medications   Medication Sig Start Date End Date Taking? Authorizing Provider  acetaminophen (TYLENOL) 500 MG tablet Take 500 mg by mouth every 6 (six)  hours as needed. For pain   Yes Historical Provider, MD  albuterol (PROVENTIL HFA;VENTOLIN HFA) 108 (90 BASE) MCG/ACT inhaler Inhale 2 puffs into the lungs 2 (two) times daily as needed. For shortness of breath   Yes Historical Provider, MD  buPROPion (WELLBUTRIN SR) 150 MG 12 hr tablet Take 150 mg by mouth 2 (two) times daily.   Yes Historical Provider, MD  calcium carbonate (TUMS - DOSED IN MG ELEMENTAL CALCIUM) 500 MG chewable tablet Chew 1 tablet by mouth daily as needed. For indigestion   Yes Historical Provider, MD  cephALEXin (KEFLEX) 500 MG capsule Take 500 mg by mouth 2 (two) times daily.   Yes Historical Provider, MD  clindamycin (CLEOCIN T) 1 % lotion Apply 1 application topically 2 (two) times daily.    Yes Historical Provider, MD  Eflornithine HCl (VANIQA) 13.9 % cream Apply topically 2 (two) times daily as needed.    Yes Historical Provider, MD  Fluticasone-Salmeterol (ADVAIR) 250-50 MCG/DOSE AEPB Inhale 1 puff into the lungs every 12 (twelve) hours.   Yes Historical Provider, MD  ibuprofen (ADVIL,MOTRIN) 800 MG tablet Take 800 mg by mouth 4 (four) times daily as needed. For pain   Yes Historical Provider, MD  ipratropium-albuterol (DUONEB) 0.5-2.5 (3) MG/3ML SOLN Take 3 mLs by nebulization 2 (two) times daily as needed. For shortness of breath 09/09/12  Yes [name], MD  losartan-hydrochlorothiazide (HYZAAR) 100-12.5 MG per tablet Take 1 tablet by mouth daily.   Yes Historical Provider, MD  magnesium citrate 1.745 GM/30ML SOLN Take 1 Bottle by mouth daily as needed. For constipation   Yes Historical Provider, MD  montelukast (SINGULAIR) 10 MG tablet Take 10 mg by mouth at bedtime.    Yes Historical  Provider, MD  phentermine 30 MG capsule Take 30 mg by mouth daily.   Yes Historical Provider, MD  pravastatin (PRAVACHOL) 40 MG tablet Take 40 mg by mouth daily.   Yes Historical Provider, MD  predniSONE (DELTASONE) 5 MG tablet Take 10 mg by mouth daily. Use as directed 11/14/12  Yes [name], MD  tiotropium (SPIRIVA) 18 MCG inhalation capsule Place 18 mcg into inhaler and inhale daily.   Yes Historical Provider, MD  traMADol (ULTRAM) 50 MG tablet Take 50 mg by mouth 4 (four) times daily as needed. For pain   Yes Historical Provider, MD    Allergies  Allergen Reactions  . Codeine Other (See Comments)    Reaction unknown  . Cyclinex (Tetracycline Hcl) Other (See Comments)    Reaction unknown  . Morphine And Related Other (See Comments)    Reaction unknown    Physical Exam:  General - Obese  HEENT - No sinus tenderness, MP 3, no oral exudate, no LAN Cardiac - s1s2 regular, no murmur  Chest - decreased breath sounds, b/l expiratory wheeze, no rales, no dullness Abdomen - soft, + bowel sounds Extremities - no e/c/c  Neurologic - normal strength Skin - no rashes  Psychiatric - normal mood, behavior   Assessment/Plan:  Kaitlyn [name] Pager:  [phone] 12/24/2012, 10:32 AM

## 2012-12-24 NOTE — Assessment & Plan Note (Signed)
I don't think her current symptoms are related to her sarcoid.  Will repeat chest xray to further assess.  Will gradually taper her down to prednisone 5 mg daily, and leave her at that dose until next follow up.  Will then need to determine when to repeat CT chest.

## 2012-12-24 NOTE — Assessment & Plan Note (Signed)
She is to continue spiriva, advair, and prn albuterol.

## 2012-12-24 NOTE — Assessment & Plan Note (Signed)
She likely has COPD exacerbation.  I don't think she needs antibiotics.  Will increase her dose of prednisone for the next several days, and then gradually taper down as tolerated.

## 2012-12-24 NOTE — Patient Instructions (Signed)
Prednisone 10 mg pill >> 3 pills for 3 days, 2 pills for 3 days, 1 pill for 3 days.  Once this is complete then take Prednisone 5 mg one pill daily until next visit.  Chest xray today  Follow up in 6 weeks

## 2012-12-26 ENCOUNTER — Telehealth: Payer: Self-pay | Admitting: Pulmonary Disease

## 2012-12-26 NOTE — Telephone Encounter (Signed)
12/24/2012  *RADIOLOGY REPORT*   Clinical Data: Sarcoid, COPD, cough, wheezing   CHEST - 2 VIEW   Comparison: 08/29/2012    Findings: Lungs are essentially clear.  No focal consolidation.  The heart is normal in size.  Stable bilateral hilar prominence, likely corresponding to lymphadenopathy on prior CT.  Mild degenerative changes of the visualized thoracolumbar spine.    IMPRESSION: No evidence of acute cardiopulmonary disease.  Stable bilateral hilar prominence, likely corresponding to lymphadenopathy on prior CT.    Original Report Authenticated By: Charline Bills, M.D.    Left message explaining that CXR did not show progression of sarcoid.  She is to continue tx as detailed at visit on 12/24/12.  She is to call back with questions.

## 2012-12-30 ENCOUNTER — Other Ambulatory Visit: Payer: Self-pay | Admitting: Internal Medicine

## 2012-12-30 ENCOUNTER — Ambulatory Visit
Admission: RE | Admit: 2012-12-30 | Discharge: 2012-12-30 | Disposition: A | Discharge status: Home / Self Care | Payer: PRIVATE HEALTH INSURANCE | Source: Ambulatory Visit | Attending: Internal Medicine | Admitting: Internal Medicine

## 2012-12-30 DIAGNOSIS — R52 Pain, unspecified: Secondary | ICD-10-CM

## 2013-01-30 ENCOUNTER — Telehealth: Payer: Self-pay | Admitting: Pulmonary Disease

## 2013-01-30 NOTE — Telephone Encounter (Signed)
lmomtcb x1 for pt. Is she needing a refill? Have not received anything from the pharmacy

## 2013-01-31 NOTE — Telephone Encounter (Signed)
Called do PA and was told that she would need to try and fail Symibort or Dulera. Pt is aware of this information. I advised that we would have to send this message to Dr. Craige Cotta and get his opinion on which one she should use.  I will leave samples up front for the pt of Advair to last her until we hear back from Dr. Craige Cotta. She agreed and verbalized understanding.  Dr. Craige Cotta please advise as to which inhaler you want her to use. Thanks.

## 2013-01-31 NOTE — Telephone Encounter (Signed)
Patient returning call.  Needing medication asap.patient is almost out.  She is needing a refill.

## 2013-02-03 NOTE — Telephone Encounter (Signed)
lmomtcb x1 for pt 

## 2013-02-03 NOTE — Telephone Encounter (Signed)
Please change her to dulera 100/5 two puffs bid.  Send one inhaler with 5 refills.

## 2013-02-04 ENCOUNTER — Ambulatory Visit (INDEPENDENT_AMBULATORY_CARE_PROVIDER_SITE_OTHER): Payer: PRIVATE HEALTH INSURANCE | Admitting: Pulmonary Disease

## 2013-02-04 ENCOUNTER — Encounter: Payer: Self-pay | Admitting: Pulmonary Disease

## 2013-02-04 VITALS — BP 158/80 | HR 91 | Temp 98.8°F | Ht 67.0 in | Wt 306.8 lb

## 2013-02-04 DIAGNOSIS — D869 Sarcoidosis, unspecified: Secondary | ICD-10-CM

## 2013-02-04 DIAGNOSIS — D860 Sarcoidosis of lung: Secondary | ICD-10-CM

## 2013-02-04 DIAGNOSIS — J99 Respiratory disorders in diseases classified elsewhere: Secondary | ICD-10-CM

## 2013-02-04 DIAGNOSIS — J449 Chronic obstructive pulmonary disease, unspecified: Secondary | ICD-10-CM

## 2013-02-04 MED ORDER — PREDNISONE 5 MG PO TABS: ORAL_TABLET | ORAL | Status: DC

## 2013-02-04 MED ORDER — MOMETASONE FURO-FORMOTEROL FUM 100-5 MCG/ACT IN AERO: 2.0000 | INHALATION_SPRAY | Freq: Two times a day (BID) | RESPIRATORY_TRACT | Status: DC

## 2013-02-04 MED ORDER — PREDNISONE 2.5 MG PO TABS: ORAL_TABLET | ORAL | Status: DC

## 2013-02-04 NOTE — Assessment & Plan Note (Signed)
Once she is stable with using dulera, will then have her decrease her dose of prednisone to 7.5 mg daily until next visit.  Will arrange for follow up PFT's prior next visit.  Depending on her status will determine when she needs to have follow up CT chest.  If she is not able to tolerate dose reduction in prednisone, then may need to consider adding methotrexate.

## 2013-02-04 NOTE — Patient Instructions (Addendum)
Stop advair Dulera two puffs twice per day, and rinse mouth after each use Change prednisone to 7.5 mg daily >> wait until you are sure that dulera is working okay before changing dose of prednisone Will schedule breathing test (PFT) prior to next visit Follow up in 6 weeks

## 2013-02-04 NOTE — Assessment & Plan Note (Signed)
Will change from advair to dulera.  She is to continue spiriva, singulair and prn albuterol

## 2013-02-04 NOTE — Telephone Encounter (Signed)
Pt here for OV today. Will f/u on this

## 2013-02-04 NOTE — Progress Notes (Signed)
Chief Complaint  Patient presents with  . Follow-up    Pt reports her breathing is still doing ok. C/o dry cough and wheezing w/ certain activities. No chest tx. Pt has not changed the advair yet. Pt went back to taking pred 10 mg daily    History of Present Illness: Kaitlyn [name] is a 47 y.o. 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  She was not able to tolerate dose reduction of prednisone to 5 mg daily.  She is now taking 10 mg prednisone daily.    She feels her breathing is okay.  She still gets episodes of cough and wheeze.  She has noticed more sinus drainage with onset of Spring.  She denies fever, gland swelling, or skin rash.  She has to switch from advair to [place] due to insurance coverage.  She has not received dulera yet.  Tests: CT chest 01/23/12>>bulky mediastinal/hilar LAN.  Scattered ill-defined upper lobe predominate perilymphatic and centrilobular nodules. PFT 03/13/12>>FEV1 1.65 (57%), FEV1% 58, TLC 4.44 (79%), DLCO 62%, no BD EBUS with Tbx 03/01/12>>Streptococcus pneumoniae in BAL, cytology/Tbx negative CT chest 04/30/12>>No change in the appearance of the mediastinal and hilar adenopathy and pulmonary nodularity 04/30/12 start prednisone 07/30/12 FeNO>>15 ppb CT chest 07/31/12>>b/l hilar adenopathy no change, b/l upper lobe subpleural nodularity, improved LUL GGO  She  has a past medical history of Hyperlipidemia; Obesity; Anxiety; Hidradenitis; Solitary pulmonary nodule; Osteoarthritis; Varicose vein; PONV (postoperative nausea and vomiting); Hypertension; Asthma; Shortness of breath; Carpal tunnel syndrome; Depression; 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  She  has past surgical history that includes Nasal sinus surgery (2011); Other surgical history (2010); Multiple tooth extractions (2010); and Bronchoscopy (03/01/12).   Prior to Admission medications   Medication Sig Start Date End Date Taking?  Authorizing Provider  acetaminophen (TYLENOL) 500 MG tablet Take 500 mg by mouth every 6 (six) hours as needed. For pain   Yes Historical Provider, MD  albuterol (PROVENTIL HFA;VENTOLIN HFA) 108 (90 BASE) MCG/ACT inhaler Inhale 2 puffs into the lungs 2 (two) times daily as needed. For shortness of breath   Yes Historical Provider, MD  buPROPion (WELLBUTRIN SR) 150 MG 12 hr tablet Take 150 mg by mouth 2 (two) times daily.   Yes Historical Provider, MD  calcium carbonate (TUMS - DOSED IN MG ELEMENTAL CALCIUM) 500 MG chewable tablet Chew 1 tablet by mouth daily as needed. For indigestion   Yes Historical Provider, MD  cephALEXin (KEFLEX) 500 MG capsule Take 500 mg by mouth 2 (two) times daily.   Yes Historical Provider, MD  clindamycin (CLEOCIN T) 1 % lotion Apply 1 application topically 2 (two) times daily.    Yes Historical Provider, MD  Eflornithine HCl (VANIQA) 13.9 % cream Apply topically 2 (two) times daily as needed.    Yes Historical Provider, MD  Fluticasone-Salmeterol (ADVAIR) 250-50 MCG/DOSE AEPB Inhale 1 puff into the lungs every 12 (twelve) hours.   Yes Historical Provider, MD  ibuprofen (ADVIL,MOTRIN) 800 MG tablet Take 800 mg by mouth 4 (four) times daily as needed. For pain   Yes Historical Provider, MD  ipratropium-albuterol (DUONEB) 0.5-2.5 (3) MG/3ML SOLN Take 3 mLs by nebulization 2 (two) times daily as needed. For shortness of breath 09/09/12  Yes [name], MD  losartan-hydrochlorothiazide (HYZAAR) 100-12.5 MG per tablet Take 1 tablet by mouth daily.   Yes Historical Provider, MD  magnesium citrate 1.745 GM/30ML SOLN Take 1 Bottle by mouth daily as needed. For constipation   Yes  Historical Provider, MD  montelukast (SINGULAIR) 10 MG tablet Take 10 mg by mouth at bedtime.    Yes Historical Provider, MD  phentermine 30 MG capsule Take 30 mg by mouth daily.   Yes Historical Provider, MD  pravastatin (PRAVACHOL) 40 MG tablet Take 40 mg by mouth daily.   Yes Historical Provider, MD   predniSONE (DELTASONE) 5 MG tablet Take 10 mg by mouth daily. Use as directed 11/14/12  Yes [name], MD  tiotropium (SPIRIVA) 18 MCG inhalation capsule Place 18 mcg into inhaler and inhale daily.   Yes Historical Provider, MD  traMADol (ULTRAM) 50 MG tablet Take 50 mg by mouth 4 (four) times daily as needed. For pain   Yes Historical Provider, MD    Allergies  Allergen Reactions  . Codeine Other (See Comments)    Reaction unknown  . Cyclinex (Tetracycline Hcl) Other (See Comments)    Reaction unknown  . Morphine And Related Other (See Comments)    Reaction unknown    Physical Exam:  General - Obese  HEENT - No sinus tenderness, MP 3, no oral exudate, no LAN Cardiac - s1s2 regular, no murmur  Chest - decreased breath sounds, faint b/l expiratory wheeze, no rales, no dullness Abdomen - soft, + bowel sounds Extremities - no e/c/c  Neurologic - normal strength Skin - no rashes  Psychiatric - normal mood, behavior   Assessment/Plan:  Tanav Orsak Pager:  [phone] 02/04/2013, 2:42 PM

## 2013-02-11 ENCOUNTER — Other Ambulatory Visit: Payer: Self-pay | Admitting: Geriatric Medicine

## 2013-02-11 MED ORDER — LOSARTAN POTASSIUM-HCTZ 100-12.5 MG PO TABS: 1.0000 | ORAL_TABLET | Freq: Every day | ORAL | Status: DC

## 2013-03-18 ENCOUNTER — Ambulatory Visit (INDEPENDENT_AMBULATORY_CARE_PROVIDER_SITE_OTHER): Payer: PRIVATE HEALTH INSURANCE | Admitting: Pulmonary Disease

## 2013-03-18 ENCOUNTER — Encounter: Payer: Self-pay | Admitting: Pulmonary Disease

## 2013-03-18 VITALS — BP 122/76 | HR 88 | Temp 97.8°F | Ht 66.0 in | Wt 288.0 lb

## 2013-03-18 DIAGNOSIS — J99 Respiratory disorders in diseases classified elsewhere: Secondary | ICD-10-CM

## 2013-03-18 DIAGNOSIS — J4489 Other specified chronic obstructive pulmonary disease: Secondary | ICD-10-CM

## 2013-03-18 DIAGNOSIS — D860 Sarcoidosis of lung: Secondary | ICD-10-CM

## 2013-03-18 DIAGNOSIS — J449 Chronic obstructive pulmonary disease, unspecified: Secondary | ICD-10-CM

## 2013-03-18 DIAGNOSIS — D869 Sarcoidosis, unspecified: Secondary | ICD-10-CM

## 2013-03-18 LAB — PULMONARY FUNCTION TEST

## 2013-03-18 NOTE — Patient Instructions (Signed)
Prednisone 2.5 mg daily for 3 weeks, then stop prednisone Follow up in 4 weeks

## 2013-03-18 NOTE — Assessment & Plan Note (Signed)
Improved control with spiriva, dulera, and singulair.

## 2013-03-18 NOTE — Progress Notes (Signed)
Chief Complaint  Patient presents with  . Follow-up    w/ PFT. Pt states her breathing is "great". Pt states she feels like she can breathe in better. Started coughing today d/t drainage. No wheezing, no chest tx    History of Present Illness: Kaitlyn [name] is a 47 y.o. 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  Her breathing is much better.  She is down to 5 mg prednisone daily.  She feels dulera has helped.  She uses albuterol daily before going to work >> this is more out of habit, rather than her feeling like she needs albuterol.  She is not having wheeze, cough, sputum, fever, hemoptysis, chest pain, skin rash, or gland swelling.  Tests: CT chest 01/23/12>>bulky mediastinal/hilar LAN.  Scattered ill-defined upper lobe predominate perilymphatic and centrilobular nodules. PFT 03/13/12>>FEV1 1.65 (57%), FEV1% 58, TLC 4.44 (79%), DLCO 62%, no BD EBUS with Tbx 03/01/12>>Streptococcus pneumoniae in BAL, cytology/Tbx negative CT chest 04/30/12>>No change in the appearance of the mediastinal and hilar adenopathy and pulmonary nodularity 04/30/12 start prednisone 07/30/12 FeNO>>15 ppb CT chest 07/31/12>>b/l hilar adenopathy no change, b/l upper lobe subpleural nodularity, improved LUL GGO PFT 03/18/13 >> FEV1 1.78 (69%), FEV1% 67, TLC 4.04 (75%), DLCO 92%, no BD  She  has a past medical history of Hyperlipidemia; Obesity; Anxiety; Hidradenitis; Solitary pulmonary nodule; Osteoarthritis; Varicose vein; PONV (postoperative nausea and vomiting); Hypertension; Asthma; Shortness of breath; Carpal tunnel syndrome; Depression; 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  She  has past surgical history that includes Nasal sinus surgery (2011); Other surgical history (2010); Multiple tooth extractions (2010); and Bronchoscopy (03/01/12).   Prior to Admission medications   Medication Sig Start Date End Date Taking? Authorizing Provider  acetaminophen  (TYLENOL) 500 MG tablet Take 500 mg by mouth every 6 (six) hours as needed. For pain   Yes Historical Provider, MD  albuterol (PROVENTIL HFA;VENTOLIN HFA) 108 (90 BASE) MCG/ACT inhaler Inhale 2 puffs into the lungs 2 (two) times daily as needed. For shortness of breath   Yes Historical Provider, MD  buPROPion (WELLBUTRIN SR) 150 MG 12 hr tablet Take 150 mg by mouth 2 (two) times daily.   Yes Historical Provider, MD  calcium carbonate (TUMS - DOSED IN MG ELEMENTAL CALCIUM) 500 MG chewable tablet Chew 1 tablet by mouth daily as needed. For indigestion   Yes Historical Provider, MD  cephALEXin (KEFLEX) 500 MG capsule Take 500 mg by mouth 2 (two) times daily.   Yes Historical Provider, MD  clindamycin (CLEOCIN T) 1 % lotion Apply 1 application topically 2 (two) times daily.    Yes Historical Provider, MD  Eflornithine HCl (VANIQA) 13.9 % cream Apply topically 2 (two) times daily as needed.    Yes Historical Provider, MD  Fluticasone-Salmeterol (ADVAIR) 250-50 MCG/DOSE AEPB Inhale 1 puff into the lungs every 12 (twelve) hours.   Yes Historical Provider, MD  ibuprofen (ADVIL,MOTRIN) 800 MG tablet Take 800 mg by mouth 4 (four) times daily as needed. For pain   Yes Historical Provider, MD  ipratropium-albuterol (DUONEB) 0.5-2.5 (3) MG/3ML SOLN Take 3 mLs by nebulization 2 (two) times daily as needed. For shortness of breath 09/09/12  Yes [name], MD  losartan-hydrochlorothiazide (HYZAAR) 100-12.5 MG per tablet Take 1 tablet by mouth daily.   Yes Historical Provider, MD  magnesium citrate 1.745 GM/30ML SOLN Take 1 Bottle by mouth daily as needed. For constipation   Yes Historical Provider, MD  montelukast (SINGULAIR) 10 MG tablet Take 10 mg  by mouth at bedtime.    Yes Historical Provider, MD  phentermine 30 MG capsule Take 30 mg by mouth daily.   Yes Historical Provider, MD  pravastatin (PRAVACHOL) 40 MG tablet Take 40 mg by mouth daily.   Yes Historical Provider, MD  predniSONE (DELTASONE) 5 MG tablet  Take 10 mg by mouth daily. Use as directed 11/14/12  Yes [name], MD  tiotropium (SPIRIVA) 18 MCG inhalation capsule Place 18 mcg into inhaler and inhale daily.   Yes Historical Provider, MD  traMADol (ULTRAM) 50 MG tablet Take 50 mg by mouth 4 (four) times daily as needed. For pain   Yes Historical Provider, MD    Allergies  Allergen Reactions  . Codeine Other (See Comments)    Reaction unknown  . Cyclinex (Tetracycline Hcl) Other (See Comments)    Reaction unknown  . Morphine And Related Other (See Comments)    Reaction unknown    Physical Exam:  General - Obese  HEENT - No sinus tenderness, MP 3, no oral exudate, no LAN Cardiac - s1s2 regular, no murmur  Chest - decreased breath sounds, no wheeze, no rales, no dullness Abdomen - soft, + bowel sounds Extremities - no e/c/c  Neurologic - normal strength Skin - no rashes  Psychiatric - normal mood, behavior   Assessment/Plan:  [name] Pager:  [phone] 03/18/2013, 2:01 PM

## 2013-03-18 NOTE — Progress Notes (Signed)
PFT done today. 

## 2013-03-18 NOTE — Assessment & Plan Note (Signed)
She has clinical improvement.  DLCO on PFT much better also.  Will continue to wean off prednisone as tolerated.  Will change to 2.5 mg daily for 3 weeks, and then stop prednisone.  Will re-assess status in 4 weeks, and then likely plan to repeat CT chest.

## 2013-04-03 ENCOUNTER — Ambulatory Visit: Payer: Self-pay | Admitting: Internal Medicine

## 2013-04-04 ENCOUNTER — Encounter: Payer: Self-pay | Admitting: *Deleted

## 2013-04-07 ENCOUNTER — Encounter: Payer: Self-pay | Admitting: Internal Medicine

## 2013-04-07 ENCOUNTER — Ambulatory Visit (INDEPENDENT_AMBULATORY_CARE_PROVIDER_SITE_OTHER): Payer: PRIVATE HEALTH INSURANCE | Admitting: Internal Medicine

## 2013-04-07 VITALS — BP 118/82 | HR 84 | Temp 98.2°F | Resp 12 | Ht 66.0 in | Wt 294.0 lb

## 2013-04-07 DIAGNOSIS — J99 Respiratory disorders in diseases classified elsewhere: Secondary | ICD-10-CM

## 2013-04-07 DIAGNOSIS — E785 Hyperlipidemia, unspecified: Secondary | ICD-10-CM | POA: Insufficient documentation

## 2013-04-07 DIAGNOSIS — E669 Obesity, unspecified: Secondary | ICD-10-CM

## 2013-04-07 DIAGNOSIS — J4489 Other specified chronic obstructive pulmonary disease: Secondary | ICD-10-CM

## 2013-04-07 DIAGNOSIS — L601 Onycholysis: Secondary | ICD-10-CM | POA: Insufficient documentation

## 2013-04-07 DIAGNOSIS — D860 Sarcoidosis of lung: Secondary | ICD-10-CM

## 2013-04-07 DIAGNOSIS — J449 Chronic obstructive pulmonary disease, unspecified: Secondary | ICD-10-CM

## 2013-04-07 DIAGNOSIS — L608 Other nail disorders: Secondary | ICD-10-CM

## 2013-04-07 DIAGNOSIS — I10 Essential (primary) hypertension: Secondary | ICD-10-CM

## 2013-04-07 DIAGNOSIS — D869 Sarcoidosis, unspecified: Secondary | ICD-10-CM

## 2013-04-07 MED ORDER — PRAVASTATIN SODIUM 40 MG PO TABS: 40.0000 mg | ORAL_TABLET | Freq: Every day | ORAL | Status: DC

## 2013-04-07 NOTE — Progress Notes (Signed)
Patient ID: Kaitlyn [name], female   DOB: [birth year], 47 y.o.   MRN: [id number] Code Status: full  Allergies  Allergen Reactions  . Codeine Other (See Comments)    Reaction unknown  . Cyclinex (Tetracycline Hcl) Other (See Comments)    Reaction unknown  . Morphine And Related Other (See Comments)    Reaction unknown    Chief Complaint  Patient presents with  . Medical Managment of Chronic Issues    finger nail problem on right hand    HPI: Patient is a 47 y.o. AA female seen in the office today for f/u of chronic conditions and concern about her right middle fingernail.  Right middle finger has thickening on one side and discoloration, painful.    Dulera helping her.  No longer wheezing.  Did better on PFTs.  For repeat CT next time.  Trying to reduce prednisone, but now having some wheezing again.  Was to be off soon and now is more short of breath.    Eye doctor thinks retina detaching so he referred her to a specialist--[facility].    Is down 6 lbs from March.  Likes fast food.     Review of Systems:  Review of Systems  Constitutional: Positive for weight loss.  HENT: Negative for hearing loss.   Eyes: Negative for blurred vision.  Respiratory: Positive for cough. Negative for shortness of breath and wheezing.   Cardiovascular: Negative for chest pain.  Gastrointestinal: Negative for abdominal pain and constipation.  Genitourinary: Negative for dysuria.  Musculoskeletal: Negative for myalgias.  Skin:       Fingernail changes  Neurological: Negative for dizziness and headaches.  Psychiatric/Behavioral: Positive for depression.     Past Medical History  Diagnosis Date  . Hyperlipidemia     takes pravachol  . Obesity   . Anxiety   . Hidradenitis   . Solitary pulmonary nodule   . Osteoarthritis   . Varicose vein   . PONV (postoperative nausea and vomiting)   . Hypertension     takes medications daily  . Asthma     inhalers   .  Shortness of breath     exertion  . Carpal tunnel syndrome     bilateral  . Depression     takes wellbutrin  . 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 Calcium deposits in tendon and bursa   . Sarcoidosis   . Contracture of joint of other specified site   . Cramp of limb   . Other acute otitis externa   . Cellulitis and abscess of finger, unspecified   . Unspecified nasal polyp   . Pain in joint, ankle and foot   . Carpal tunnel syndrome   . Pain in joint, hand   . Obesity, unspecified   . Keloid scar   . Tobacco use disorder   . Other and unspecified special symptom or syndrome, not elsewhere classified   . Extrinsic asthma, unspecified   . Unspecified constipation   . Osteoarthrosis, unspecified whether generalized or localized, unspecified site   . Pain in joint, pelvic region and thigh   . Pain in joint, lower leg    Past  Surgical History  Procedure Laterality Date  . Nasal sinus surgery  2011  . Other surgical history  2010    cyst removal from right middle finger    . Multiple tooth extractions  2010  . Bronchoscopy  03/01/12  . Rhinoplasty  08/2009  . Removal of growth from finger      DR Metro Kung   Social History:   reports that she quit smoking about 14 months ago. Her smoking use included Cigarettes. She has a 35 pack-year smoking history. She has never used smokeless tobacco. She reports that  drinks alcohol. She reports that she does not use illicit drugs.  Family History  Problem Relation Age of Onset  . Cancer Maternal Aunt     mother's aunt  . Diabetes Maternal Aunt   . Anesthesia problems Neg Hx   . Hypotension Neg Hx   . Malignant  hyperthermia Neg Hx   . Pseudochol deficiency Neg Hx     Medications: Patient's Medications  New Prescriptions   No medications on file  Previous Medications   ACETAMINOPHEN (TYLENOL) 500 MG TABLET    Take 500 mg by mouth every 6 (six) hours as needed. For pain   ALBUTEROL (PROVENTIL HFA;VENTOLIN HFA) 108 (90 BASE) MCG/ACT INHALER    Inhale 2 puffs into the lungs 2 (two) times daily as needed. For shortness of breath   BUPROPION (WELLBUTRIN SR) 150 MG 12 HR TABLET    Take 150 mg by mouth 2 (two) times daily.   CALCIUM CARBONATE (TUMS - DOSED IN MG ELEMENTAL CALCIUM) 500 MG CHEWABLE TABLET    Chew 1 tablet by mouth daily as needed. For indigestion   CEPHALEXIN (KEFLEX) 500 MG CAPSULE    Take 500 mg by mouth 2 (two) times daily.   CLINDAMYCIN (CLEOCIN T) 1 % LOTION    Apply 1 application topically as needed.    EFLORNITHINE HCL (VANIQA) 13.9 % CREAM    Apply topically 2 (two) times daily as needed.    IBUPROFEN (ADVIL,MOTRIN) 800 MG TABLET    Take 800 mg by mouth 4 (four) times daily as needed. For pain   IPRATROPIUM-ALBUTEROL (DUONEB) 0.5-2.5 (3) MG/3ML SOLN    Take 3 mLs by nebulization 2 (two) times daily as needed. For shortness of breath   LOSARTAN-HYDROCHLOROTHIAZIDE (HYZAAR) 100-12.5 MG PER TABLET    Take 1 tablet by mouth daily.   MAGNESIUM CITRATE 1.745 GM/30ML SOLN    Take 1 Bottle by mouth daily as needed. For constipation   MOMETASONE-FORMOTEROL (DULERA) 100-5 MCG/ACT AERO    Inhale 2 puffs into the lungs 2 (two) times daily.   MONTELUKAST (SINGULAIR) 10 MG TABLET    Take 10 mg by mouth at bedtime.    PHENTERMINE 30 MG CAPSULE    Take 30 mg by mouth daily.   PREDNISONE (DELTASONE) 2.5 MG TABLET    Use as directed   TIOTROPIUM (SPIRIVA) 18 MCG INHALATION CAPSULE    Place 18 mcg into inhaler and inhale daily.   TRAMADOL (ULTRAM) 50 MG TABLET    Take 50 mg by mouth 4 (four) times daily as needed. For pain  Modified Medications   Modified Medication Previous Medication   PRAVASTATIN  (PRAVACHOL) 40 MG TABLET pravastatin (PRAVACHOL) 40 MG tablet      Take 1 tablet (40 mg total) by mouth daily.    Take 40 mg by mouth daily.  Discontinued Medications   No medications on file     Physical Exam: Filed Vitals:  04/07/13 1108  BP: 118/82  Pulse: 84  Temp: 98.2 F (36.8 C)  TempSrc: Oral  Resp: 12  Height: 5\' 6"  (1.676 m)  Weight: 294 lb (133.358 kg)  SpO2: 97%  Physical Exam  Constitutional: She is oriented to person, place, and time. No distress.  Obese black female  HENT:  Head: Normocephalic and atraumatic.  Cardiovascular: Normal rate, regular rhythm, normal heart sounds and intact distal pulses.   Pulmonary/Chest: Effort normal and breath sounds normal. She has no wheezes.  Abdominal: Soft. Bowel sounds are normal. She exhibits no distension. There is no tenderness.  Musculoskeletal: Normal range of motion. She exhibits no edema and no tenderness.  Neurological: She is alert and oriented to person, place, and time.  Skin: Skin is warm and dry.  Right middle digit with thickening of nail   Past Procedures: June/ 2002 Lumbar Puncture Dr. Corliss Skains August/ 2002 Lumbar Puncture Dr.Clark 07/2007-Mammogram: Negative 04/2008-Nerve Conduction Studies 06/22/2010- No evidence of bilateral lower extremity deep vein thrombosis. 07/13/2010 - Arterial duplex imaging 07/13/2010 - [place] lower extremity -  Occult lower extremity arterial occlusive disease. 01/19/2012 - DG Chest 2 View - Nodule in the right upper lobe and bilateral hilar adenopathy. This could represent malignancy. Sarcoidosis can also give this appearance. 01/23/2012 - CT Chest - Lung findings and mediastinal hilar adenopathy are most consistent  with sarcoidosis. 03/2012 Bronchoscopy Dr Craige CottaCon[place])   Assessment/Plan 1. Hyperlipidemia -encouraged to continue diet and exercise, try to avoid the fast foods she enjoys - Lipid panel; Future  2. Obesity -again encouraged her and congratulated her on the  weight loss;  Not helped by her need for steroids for her sarcoidosis - CMP; Future - Hemoglobin A1c; Future  3. Hypertension -bp controlled with current therapy  4. Onycholysis -advised to use topical antifungals due to hepatic side effects of oral lamisil--if not improving, may consider prescription if she requests it with close liver monitoring -could also be sarcoid involvement of the nail--monitor  5. Pulmonary sarcoidosis -follows with pulmonary for this, has not been able to wean completely off of steroid therapy -no longer smokes - CBC with Differential; Future  6. COPD with chronic bronchitis -recently better, no wheezing on exam, cont current inhalers - CBC with Differential; Future  Labs/tests ordered:  Cbc, cmp, hba1c, flp

## 2013-04-08 ENCOUNTER — Other Ambulatory Visit: Payer: PRIVATE HEALTH INSURANCE

## 2013-04-08 DIAGNOSIS — E785 Hyperlipidemia, unspecified: Secondary | ICD-10-CM

## 2013-04-08 DIAGNOSIS — D860 Sarcoidosis of lung: Secondary | ICD-10-CM

## 2013-04-08 DIAGNOSIS — E669 Obesity, unspecified: Secondary | ICD-10-CM

## 2013-04-08 DIAGNOSIS — J449 Chronic obstructive pulmonary disease, unspecified: Secondary | ICD-10-CM

## 2013-04-08 DIAGNOSIS — L601 Onycholysis: Secondary | ICD-10-CM

## 2013-04-09 LAB — LIPID PANEL
Chol/HDL Ratio: 2.8 ratio units (ref 0.0–4.4)
Cholesterol, Total: 158 mg/dL (ref 100–199)
HDL: 57 mg/dL (ref >39)
LDL Calculated: 85 mg/dL (ref 0–99)
Triglycerides: 81 mg/dL (ref 0–149)
VLDL Cholesterol Cal: 16 mg/dL (ref 5–40)

## 2013-04-09 LAB — CBC WITH DIFFERENTIAL/PLATELET
Basophils Absolute: 0 10*3/uL (ref 0.0–0.2)
Basos: 0 % (ref 0–3)
Eos: 2 % (ref 0–5)
Eosinophils Absolute: 0.1 10*3/uL (ref 0.0–0.4)
HCT: 39.3 % (ref 34.0–46.6)
Hemoglobin: 13.2 g/dL (ref 11.1–15.9)
Immature Grans (Abs): 0 10*3/uL (ref 0.0–0.1)
Immature Granulocytes: 0 % (ref 0–2)
Lymphocytes Absolute: 1.1 10*3/uL (ref 0.7–3.1)
Lymphs: 18 % (ref 14–46)
MCH: 30.2 pg (ref 26.6–33.0)
MCHC: 33.6 g/dL (ref 31.5–35.7)
MCV: 90 fL (ref 79–97)
Monocytes Absolute: 0.5 10*3/uL (ref 0.1–0.9)
Monocytes: 7 % (ref 4–12)
Neutrophils Absolute: 4.6 10*3/uL (ref 1.4–7.0)
Neutrophils Relative %: 73 % (ref 40–74)
RBC: 4.37 x10E6/uL (ref 3.77–5.28)
RDW: 14.2 % (ref 12.3–15.4)
WBC: 6.3 10*3/uL (ref 3.4–10.8)

## 2013-04-09 LAB — COMPREHENSIVE METABOLIC PANEL
ALT: 15 IU/L (ref 0–32)
AST: 24 IU/L (ref 0–40)
Albumin/Globulin Ratio: 1.1 (ref 1.1–2.5)
Albumin: 3.9 g/dL (ref 3.5–5.5)
Alkaline Phosphatase: 99 IU/L (ref 39–117)
BUN/Creatinine Ratio: 11 (ref 9–23)
BUN: 12 mg/dL (ref 6–24)
CO2: 28 mmol/L (ref 19–28)
Calcium: 10.3 mg/dL — ABNORMAL HIGH (ref 8.7–10.2)
Chloride: 98 mmol/L (ref 97–108)
Creatinine, Ser: 1.11 mg/dL — ABNORMAL HIGH (ref 0.57–1.00)
GFR calc Af Amer: 68 mL/min/{1.73_m2} (ref >59)
GFR calc non Af Amer: 59 mL/min/{1.73_m2} — ABNORMAL LOW (ref >59)
Globulin, Total: 3.6 g/dL (ref 1.5–4.5)
Glucose: 81 mg/dL (ref 65–99)
Potassium: 4.5 mmol/L (ref 3.5–5.2)
Sodium: 139 mmol/L (ref 134–144)
Total Bilirubin: 0.3 mg/dL (ref 0.0–1.2)
Total Protein: 7.5 g/dL (ref 6.0–8.5)

## 2013-04-09 LAB — HEMOGLOBIN A1C
Est. average glucose Bld gHb Est-mCnc: 123 mg/dL
Hgb A1c MFr Bld: 5.9 % — ABNORMAL HIGH (ref 4.8–5.6)

## 2013-04-11 ENCOUNTER — Encounter (INDEPENDENT_AMBULATORY_CARE_PROVIDER_SITE_OTHER): Payer: PRIVATE HEALTH INSURANCE | Admitting: Ophthalmology

## 2013-04-11 ENCOUNTER — Telehealth: Payer: Self-pay | Admitting: Pulmonary Disease

## 2013-04-11 DIAGNOSIS — I10 Essential (primary) hypertension: Secondary | ICD-10-CM

## 2013-04-11 DIAGNOSIS — D860 Sarcoidosis of lung: Secondary | ICD-10-CM

## 2013-04-11 DIAGNOSIS — H35039 Hypertensive retinopathy, unspecified eye: Secondary | ICD-10-CM

## 2013-04-11 DIAGNOSIS — H2510 Age-related nuclear cataract, unspecified eye: Secondary | ICD-10-CM

## 2013-04-11 DIAGNOSIS — H33309 Unspecified retinal break, unspecified eye: Secondary | ICD-10-CM

## 2013-04-11 DIAGNOSIS — H35419 Lattice degeneration of retina, unspecified eye: Secondary | ICD-10-CM

## 2013-04-11 DIAGNOSIS — H43819 Vitreous degeneration, unspecified eye: Secondary | ICD-10-CM

## 2013-04-11 NOTE — Telephone Encounter (Signed)
Discussed with pt.  She changed to 2.5 mg prednisone earlier this week.  With this change she has noticed more cough, wheeze, and chest discomfort.  She has been using her nebulizer more, and this helps some.  She denies sinus congestion, sore throat, fever, chills, nausea, abdominal pain, diarrhea, gland swelling, or skin rashes.  Advised her to take 10 mg prednisone on 6/13 and 6/14.  She is then to transition to prednisone 5 mg daily on 6/15.  Will arrange for HRCT chest with contrast to further assess >> will call her with results and then decide about further plan for weaning prednisone versus adding steroid sparing agent.

## 2013-04-11 NOTE — Telephone Encounter (Signed)
I spoke with pt. She c/o CP, increase SOB, on and off hoarseness, "spits up clear phlem" (does not cough it up). She stated all this started when she decreased her prednisone to 2.5 mg daily. She is requesting a CT scan. She did not want to come in and see another provider. Please advise Dr. Craige Cotta thanks

## 2013-04-14 ENCOUNTER — Ambulatory Visit (INDEPENDENT_AMBULATORY_CARE_PROVIDER_SITE_OTHER)
Admission: RE | Admit: 2013-04-14 | Discharge: 2013-04-14 | Disposition: A | Discharge status: Home / Self Care | Payer: PRIVATE HEALTH INSURANCE | Source: Ambulatory Visit | Attending: Pulmonary Disease | Admitting: Pulmonary Disease

## 2013-04-14 DIAGNOSIS — J99 Respiratory disorders in diseases classified elsewhere: Secondary | ICD-10-CM

## 2013-04-14 DIAGNOSIS — D860 Sarcoidosis of lung: Secondary | ICD-10-CM

## 2013-04-14 DIAGNOSIS — D869 Sarcoidosis, unspecified: Secondary | ICD-10-CM

## 2013-04-14 MED ORDER — IOHEXOL 300 MG/ML  SOLN
80.0000 mL | Freq: Once | INTRAMUSCULAR | Status: AC | PRN
  Administered 2013-04-14: 80 mL via INTRAVENOUS

## 2013-04-21 ENCOUNTER — Encounter (INDEPENDENT_AMBULATORY_CARE_PROVIDER_SITE_OTHER): Payer: PRIVATE HEALTH INSURANCE | Admitting: Ophthalmology

## 2013-04-23 ENCOUNTER — Telehealth: Payer: Self-pay | Admitting: Pulmonary Disease

## 2013-04-23 NOTE — Telephone Encounter (Signed)
Ct Chest W Contrast  04/14/2013   *RADIOLOGY REPORT*  Clinical Data: Sarcoidosis, cough, increased shortness of breath  CT CHEST WITH CONTRAST  Technique:  Multidetector CT imaging of the chest was performed following the standard protocol during bolus administration of intravenous contrast.  Contrast: 80mL OMNIPAQUE IOHEXOL 300 MG/ML  SOLN  Comparison: 07/31/2012  Findings: Peribronchovascular nodularity in the bilateral upper and lower lobes, right upper lobe predominant, more conspicuous/mildly progressed when compared the prior CT.  Mild bronchial wall thickening, most prominent in the right middle lobe (series 6/image 25).  These findings are compatible with known history of sarcoidosis.  No focal consolidation.  No pleural effusion or pneumothorax.  On inspiratory / expiratory imaging, there is no evidence of air trapping.  Visualized thyroid is unremarkable.  The heart is normal in size.  No pericardial effusion.  Mediastinal/bilateral hilar lymphadenopathy, grossly unchanged, including: --13 mm short-axis right paratracheal node (series 2/image 11), unchanged --10 mm short-axis prevascular node (series 2/image 16), unchanged --16 mm short axis right hilar node (series 2/image 20), unchanged --11 mm short-axis left hilar node (series 2/image 20), previously 10 mm --14 mm short-axis subcarinal node (series 2 tube/image 25), previously 13 mm  Visualized upper abdomen is unremarkable.  Mild degenerative changes of the visualized thoracolumbar spine.  IMPRESSION: Peribronchovascular nodularity related to known sarcoidosis, mildly progressed.  Mediastinal/bilateral hilar lymphadenopathy, grossly unchanged.   Original Report Authenticated By: Charline Bills, M.D.   Will have my nurse inform pt that she CT chest shows stable changes from her sarcoidosis.  She should continue taking prednisone 5 mg daily until her next visit on 04/29/13.

## 2013-04-23 NOTE — Telephone Encounter (Signed)
LMTCB

## 2013-04-29 ENCOUNTER — Ambulatory Visit (INDEPENDENT_AMBULATORY_CARE_PROVIDER_SITE_OTHER): Payer: PRIVATE HEALTH INSURANCE | Admitting: Pulmonary Disease

## 2013-04-29 ENCOUNTER — Other Ambulatory Visit: Payer: PRIVATE HEALTH INSURANCE

## 2013-04-29 ENCOUNTER — Encounter: Payer: Self-pay | Admitting: Pulmonary Disease

## 2013-04-29 ENCOUNTER — Ambulatory Visit (INDEPENDENT_AMBULATORY_CARE_PROVIDER_SITE_OTHER)
Admission: RE | Admit: 2013-04-29 | Discharge: 2013-04-29 | Disposition: A | Discharge status: Home / Self Care | Payer: PRIVATE HEALTH INSURANCE | Source: Ambulatory Visit | Attending: Pulmonary Disease | Admitting: Pulmonary Disease

## 2013-04-29 VITALS — BP 128/78 | HR 92 | Temp 99.0°F | Ht 67.0 in | Wt 285.2 lb

## 2013-04-29 DIAGNOSIS — D869 Sarcoidosis, unspecified: Secondary | ICD-10-CM

## 2013-04-29 DIAGNOSIS — J449 Chronic obstructive pulmonary disease, unspecified: Secondary | ICD-10-CM

## 2013-04-29 DIAGNOSIS — J99 Respiratory disorders in diseases classified elsewhere: Secondary | ICD-10-CM

## 2013-04-29 DIAGNOSIS — D860 Sarcoidosis of lung: Secondary | ICD-10-CM

## 2013-04-29 MED ORDER — FOLIC ACID 1 MG PO TABS: 1.0000 mg | ORAL_TABLET | Freq: Every day | ORAL | Status: DC

## 2013-04-29 MED ORDER — PREDNISONE 2.5 MG PO TABS: 10.0000 mg | ORAL_TABLET | Freq: Every day | ORAL | Status: DC

## 2013-04-29 MED ORDER — METHOTREXATE 2.5 MG PO TABS: ORAL_TABLET | ORAL | Status: DC

## 2013-04-29 NOTE — Assessment & Plan Note (Signed)
She has persistent symptoms and radiographic findings.  Will check for hepatitis virus >> if negative will start methotrexate.  Will increase prednisone to 10 mg daily, until she is on effective dose for methotrexate.  Will also get xray right hand >> depending on results she may need rheumatology evaluation.  Reviewed dose schedule for methotrexate, need for lab monitoring, and side effects to monitor for.  Also reviewed need to take folic acid.

## 2013-04-29 NOTE — Telephone Encounter (Signed)
Pt aware of results. Confirmed appointment for today at 2:15pm.

## 2013-04-29 NOTE — Patient Instructions (Signed)
Methotrexate 2.5 mg pill >> 3 pills once per week for 2 weeks, then 4 pills once per week for 2 weeks, then 5 pills once per week Folic acid 1 mg daily, except on day you take methotrexate Change prednisone to 10 mg daily until next visit Lab test and hand xray today Follow up in 6 weeks

## 2013-04-29 NOTE — Assessment & Plan Note (Signed)
Continue her current inhaler regimen.  Also explained how throat clearing, and forced cough can contribute to her upper airway irritation.

## 2013-04-29 NOTE — Progress Notes (Signed)
Chief Complaint  Patient presents with  . Follow-up    4 week follow up , coughing and Wheezing    History of Present Illness: Kaitlyn [name] is a 47 y.o. 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  She has been using 5 mg prednisone.  She continues to have trouble with her breathing.  She felt worse with lower dose of prednisone.  She still has cough and wheeze.  She also has globus sensation and funny noises from her throat.  She denies sinus congestion or fever.  She has not had hemoptysis.  Her mother and brother smoke, and her breathing is worse when she is around them.  She denies reflux.  She has not had skin rash.  She has noticed pain and swelling in her first finger on Rt hand.  She denies other joint pain.  She has also noticed a bump on her right shin.   Tests: CT chest 01/23/12>>bulky mediastinal/hilar LAN.  Scattered ill-defined upper lobe predominate perilymphatic and centrilobular nodules. PFT 03/13/12>>FEV1 1.65 (57%), FEV1% 58, TLC 4.44 (79%), DLCO 62%, no BD EBUS with Tbx 03/01/12>>Streptococcus pneumoniae in BAL, cytology/Tbx negative CT chest 04/30/12>>No change in the appearance of the mediastinal and hilar adenopathy and pulmonary nodularity 04/30/12 start prednisone 07/30/12 FeNO>>15 ppb CT chest 07/31/12>>b/l hilar adenopathy no change, b/l upper lobe subpleural nodularity, improved LUL GGO PFT 03/18/13 >> FEV1 1.78 (69%), FEV1% 67, TLC 4.04 (75%), DLCO 92%, no BD CT chest 04/14/13 >> peribronchovascular nodularity b/l upper lobes Rt > Lt mildly progressed, no change to mediastinal/hilar adenopathy  She  has a past medical history of Hyperlipidemia; Obesity; Anxiety; Hidradenitis; Solitary pulmonary nodule; Osteoarthritis; Varicose vein; PONV (postoperative nausea and vomiting); Hypertension; Asthma; Shortness of breath; Carpal tunnel syndrome; Depression; 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); 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); Calcium deposits in tendon  and bursa; Sarcoidosis; Contracture of joint of other specified site; Cramp of limb; Other acute otitis externa; Cellulitis and abscess of finger, unspecified; Unspecified nasal polyp; Pain in joint, ankle and foot; Carpal tunnel syndrome; Pain in joint, hand; Obesity, unspecified; Keloid scar; Tobacco use disorder; Other and unspecified special symptom or syndrome, not elsewhere classified; Extrinsic asthma, unspecified; Unspecified constipation; Osteoarthrosis, unspecified whether generalized or localized, unspecified site; Pain in joint, pelvic region and thigh; and Pain in joint, lower leg.  She  has past surgical history that includes Nasal sinus surgery (2011); Other surgical history (2010); Multiple tooth extractions (2010); Bronchoscopy (03/01/12); Rhinoplasty (08/2009); and REMOVAL OF GROWTH FROM FINGER.   Prior to Admission medications   Medication Sig Start Date End Date Taking? Authorizing Provider  acetaminophen (TYLENOL) 500 MG tablet Take 500 mg by mouth every 6 (six) hours as needed. For pain   Yes Historical Provider, MD  albuterol (PROVENTIL HFA;VENTOLIN HFA) 108 (90 BASE) MCG/ACT inhaler Inhale 2 puffs into the lungs 2 (two) times daily as needed. For shortness of breath   Yes Historical Provider, MD  buPROPion (WELLBUTRIN SR) 150 MG 12 hr tablet Take 150 mg by mouth 2 (two) times daily.   Yes Historical Provider, MD  calcium carbonate (TUMS - DOSED IN MG ELEMENTAL CALCIUM) 500 MG chewable tablet Chew 1 tablet by mouth daily as needed. For indigestion   Yes Historical Provider, MD  cephALEXin (KEFLEX) 500 MG capsule Take 500 mg by mouth 2 (two) times daily.   Yes Historical Provider, MD  clindamycin (CLEOCIN T) 1 % lotion Apply 1 application topically 2 (two) times daily.    Yes Historical Provider, MD  Eflornithine HCl (VANIQA) 13.9 % cream Apply topically 2 (two) times daily as needed.    Yes Historical Provider, MD  Fluticasone-Salmeterol (ADVAIR) 250-50 MCG/DOSE AEPB Inhale 1  puff into the lungs every 12 (twelve) hours.   Yes Historical Provider, MD  ibuprofen (ADVIL,MOTRIN) 800 MG tablet Take 800 mg by mouth 4 (four) times daily as needed. For pain   Yes Historical Provider, MD  ipratropium-albuterol (DUONEB) 0.5-2.5 (3) MG/3ML SOLN Take 3 mLs by nebulization 2 (two) times daily as needed. For shortness of breath 09/09/12  Yes [name], MD  losartan-hydrochlorothiazide (HYZAAR) 100-12.5 MG per tablet Take 1 tablet by mouth daily.   Yes Historical Provider, MD  magnesium citrate 1.745 GM/30ML SOLN Take 1 Bottle by mouth daily as needed. For constipation   Yes Historical Provider, MD  montelukast (SINGULAIR) 10 MG tablet Take 10 mg by mouth at bedtime.    Yes Historical Provider, MD  phentermine 30 MG capsule Take 30 mg by mouth daily.   Yes Historical Provider, MD  pravastatin (PRAVACHOL) 40 MG tablet Take 40 mg by mouth daily.   Yes Historical Provider, MD  predniSONE (DELTASONE) 5 MG tablet Take 10 mg by mouth daily. Use as directed 11/14/12  Yes [name], MD  tiotropium (SPIRIVA) 18 MCG inhalation capsule Place 18 mcg into inhaler and inhale daily.   Yes Historical Provider, MD  traMADol (ULTRAM) 50 MG tablet Take 50 mg by mouth 4 (four) times daily as needed. For pain   Yes Historical Provider, MD    Allergies  Allergen Reactions  . Codeine Other (See Comments)    Reaction unknown  . Cyclinex (Tetracycline Hcl) Other (See Comments)    Reaction unknown  . Morphine And Related Other (See Comments)    Reaction unknown    Physical Exam:  General - Obese  HEENT - No sinus tenderness, MP 3, no oral exudate, no LAN Cardiac - s1s2 regular, no murmur  Chest - decreased breath sounds, faint wheeze b/l, no rales, no dullness Abdomen - soft, + bowel sounds Extremities - tender, swollen Rt PIP joint on 1 st digit Neurologic - normal strength Skin - no rashes  Psychiatric - normal mood, behavior  CMP     Component Value Date/Time   NA 139 04/08/2013 0835    K 4.5 04/08/2013 0835   CL 98 04/08/2013 0835   CO2 28 04/08/2013 0835   GLUCOSE 81 04/08/2013 0835   BUN 12 04/08/2013 0835   CREATININE 1.11* 04/08/2013 0835   CALCIUM 10.3* 04/08/2013 0835   PROT 7.5 04/08/2013 0835   AST 24 04/08/2013 0835   ALT 15 04/08/2013 0835   ALKPHOS 99 04/08/2013 0835   BILITOT 0.3 04/08/2013 0835   GFRNONAA 59* 04/08/2013 0835   GFRAA 68 04/08/2013 0835    Lab Results  Component Value Date   WBC 6.3 04/08/2013   HGB 13.2 04/08/2013   HCT 39.3 04/08/2013   MCV 90 04/08/2013   PLT 236 02/29/2012     Assessment/Plan:  Kiora Hallberg Pager:  [phone] 04/29/2013, 2:38 PM

## 2013-04-30 ENCOUNTER — Telehealth: Payer: Self-pay | Admitting: Pulmonary Disease

## 2013-04-30 LAB — HEPATITIS B CORE ANTIBODY, IGM: Hep B C IgM: NEGATIVE

## 2013-04-30 LAB — HEPATITIS B SURFACE ANTIBODY,QUALITATIVE: Hep B S Ab: REACTIVE — AB

## 2013-04-30 LAB — HEPATITIS C ANTIBODY: HCV Ab: NEGATIVE

## 2013-04-30 LAB — HEPATITIS B CORE ANTIBODY, TOTAL: Hep B Core Total Ab: NEGATIVE

## 2013-04-30 NOTE — Telephone Encounter (Signed)
Dg Hand Complete Right  04/29/2013   *RADIOLOGY REPORT*  Clinical Data: Sarcoidosis, first digit pain  RIGHT HAND - COMPLETE 3+ VIEW  Comparison: None.  Findings: Three views of the right hand submitted.  No acute fracture or subluxation.  No periosteal reaction or bony erosion.  IMPRESSION: No acute fracture or subluxation.   Original Report Authenticated By: Natasha Mead, M.D.     Labs 04/29/13 >> Hep B S Ag negative, Hep B S Ab reactive, Hep B core Ab negative, Hep B core IgM negative, HCV Ab negative   Will have my nurse inform pt that her hand xray was normal.  Her lab tests did not show evidence for viral hepatitis.  She should start methotrexate as discussed at [facility] on 04/29/13.

## 2013-04-30 NOTE — Telephone Encounter (Signed)
LMTCB

## 2013-05-01 NOTE — Telephone Encounter (Signed)
Pt is aware of results. 

## 2013-05-09 ENCOUNTER — Encounter (INDEPENDENT_AMBULATORY_CARE_PROVIDER_SITE_OTHER): Payer: PRIVATE HEALTH INSURANCE | Admitting: Ophthalmology

## 2013-05-09 DIAGNOSIS — H33309 Unspecified retinal break, unspecified eye: Secondary | ICD-10-CM

## 2013-05-14 ENCOUNTER — Ambulatory Visit (INDEPENDENT_AMBULATORY_CARE_PROVIDER_SITE_OTHER): Payer: PRIVATE HEALTH INSURANCE | Admitting: Ophthalmology

## 2013-05-14 DIAGNOSIS — H33309 Unspecified retinal break, unspecified eye: Secondary | ICD-10-CM

## 2013-05-23 ENCOUNTER — Ambulatory Visit (INDEPENDENT_AMBULATORY_CARE_PROVIDER_SITE_OTHER): Payer: PRIVATE HEALTH INSURANCE | Admitting: Ophthalmology

## 2013-05-23 DIAGNOSIS — H33309 Unspecified retinal break, unspecified eye: Secondary | ICD-10-CM

## 2013-06-02 ENCOUNTER — Other Ambulatory Visit: Payer: Self-pay | Admitting: Geriatric Medicine

## 2013-06-03 ENCOUNTER — Other Ambulatory Visit: Payer: Self-pay | Admitting: Geriatric Medicine

## 2013-06-03 MED ORDER — MONTELUKAST SODIUM 10 MG PO TABS: 10.0000 mg | ORAL_TABLET | Freq: Every day | ORAL | Status: DC

## 2013-06-04 ENCOUNTER — Ambulatory Visit (INDEPENDENT_AMBULATORY_CARE_PROVIDER_SITE_OTHER): Payer: PRIVATE HEALTH INSURANCE | Admitting: Pulmonary Disease

## 2013-06-04 ENCOUNTER — Encounter: Payer: Self-pay | Admitting: Pulmonary Disease

## 2013-06-04 VITALS — BP 114/62 | HR 86 | Temp 97.9°F | Ht 67.0 in | Wt 285.6 lb

## 2013-06-04 DIAGNOSIS — D860 Sarcoidosis of lung: Secondary | ICD-10-CM

## 2013-06-04 DIAGNOSIS — J99 Respiratory disorders in diseases classified elsewhere: Secondary | ICD-10-CM

## 2013-06-04 DIAGNOSIS — D869 Sarcoidosis, unspecified: Secondary | ICD-10-CM

## 2013-06-04 DIAGNOSIS — J449 Chronic obstructive pulmonary disease, unspecified: Secondary | ICD-10-CM

## 2013-06-04 MED ORDER — SULFAMETHOXAZOLE-TRIMETHOPRIM 400-80 MG PO TABS: 1.0000 | ORAL_TABLET | ORAL | Status: DC

## 2013-06-04 MED ORDER — METHOTREXATE 2.5 MG PO TABS: ORAL_TABLET | ORAL | Status: DC

## 2013-06-04 MED ORDER — PREDNISONE 5 MG PO TABS: ORAL_TABLET | ORAL | Status: DC

## 2013-06-04 NOTE — Assessment & Plan Note (Addendum)
Clinically improved.  Will increased methotrexate to 7 pills weekly with 2.5 mg pills.  Will need to repeat CBC and CMET at next visit.  She is to gradually decrease her prednisone to off by next visit as tolerated.  Will add bactrim for PCP prophylaxis.  She is to d/c her dermatologist about whether she can continue with keflex and bactrim.

## 2013-06-04 NOTE — Assessment & Plan Note (Signed)
Continue current inhaler regimen with singulair.

## 2013-06-04 NOTE — Patient Instructions (Signed)
Bactrim one pill three times weekly Methotrexate 6 pills weekly for 2 weeks, then 7 pills weekly Prednisone >> 7.5 mg daily for 2 weeks, then 5 mg daily for 2 weeks, then 2.5 mg daily for 2 weeks, then stop prednisone Follow up in 8 weeks

## 2013-06-04 NOTE — Progress Notes (Signed)
Chief Complaint  Patient presents with  . Follow-up    Pt states that breathing has been doing well since last ov. Pt states that she had a flare of sob and chest pain at the end of July.     History of Present Illness: Kaitlyn [name] is a 47 y.o. 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  She is feeling better.  She still has occasional cough.  She is not having wheeze.  She is not needing to use albuterol much.  She denies fever, sweats, sore throat, gland swelling, abdominal pain, or diarrhea.  She still has soreness in her left first finger.  She continues 10 mg prednisone daily, and 5 pills MTX weekly.   Tests: CT chest 01/23/12>>bulky mediastinal/hilar LAN.  Scattered ill-defined upper lobe predominate perilymphatic and centrilobular nodules. PFT 03/13/12>>FEV1 1.65 (57%), FEV1% 58, TLC 4.44 (79%), DLCO 62%, no BD EBUS with Tbx 03/01/12>>Streptococcus pneumoniae in BAL, cytology/Tbx negative CT chest 04/30/12>>No change in the appearance of the mediastinal and hilar adenopathy and pulmonary nodularity 04/30/12 start prednisone 07/30/12 FeNO>>15 ppb CT chest 07/31/12>>b/l hilar adenopathy no change, b/l upper lobe subpleural nodularity, improved LUL GGO PFT 03/18/13 >> FEV1 1.78 (69%), FEV1% 67, TLC 4.04 (75%), DLCO 92%, no BD CT chest 04/14/13 >> peribronchovascular nodularity b/l upper lobes Rt > Lt mildly progressed, no change to mediastinal/hilar adenopathy Labs 04/29/13 >> Hep B S Ag negative, Hep B S Ab reactive, Hep B core Ab negative, Hep B core IgM negative, HCV Ab negative 04/30/13 Start MTX  She  has a past medical history of Hyperlipidemia; Obesity; Anxiety; Hidradenitis; Solitary pulmonary nodule; Osteoarthritis; Varicose vein; PONV (postoperative nausea and vomiting); Hypertension; Asthma; Shortness of breath; Carpal tunnel syndrome; Depression; 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); 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); Calcium deposits in tendon and bursa;  Sarcoidosis; Contracture of joint of other specified site; Cramp of limb; Other acute otitis externa; Cellulitis and abscess of finger, unspecified; Unspecified nasal polyp; Pain in joint, ankle and foot; Carpal tunnel syndrome; Pain in joint, hand; Obesity, unspecified; Keloid scar; Tobacco use disorder; Other and unspecified special symptom or syndrome, not elsewhere classified; Extrinsic asthma, unspecified; Unspecified constipation; Osteoarthrosis, unspecified whether generalized or localized, unspecified site; Pain in joint, pelvic region and thigh; and Pain in joint, lower leg.  She  has past surgical history that includes Nasal sinus surgery (2011); Other surgical history (2010); Multiple tooth extractions (2010); Bronchoscopy (03/01/12); Rhinoplasty (08/2009); and REMOVAL OF GROWTH FROM FINGER.   Prior to Admission medications   Medication Sig Start Date End Date Taking? Authorizing Provider  acetaminophen (TYLENOL) 500 MG tablet Take 500 mg by mouth every 6 (six) hours as needed. For pain   Yes Historical Provider, MD  albuterol (PROVENTIL HFA;VENTOLIN HFA) 108 (90 BASE) MCG/ACT inhaler Inhale 2 puffs into the lungs 2 (two) times daily as needed. For shortness of breath   Yes Historical Provider, MD  buPROPion (WELLBUTRIN SR) 150 MG 12 hr tablet Take 150 mg by mouth 2 (two) times daily.   Yes Historical Provider, MD  calcium carbonate (TUMS - DOSED IN MG ELEMENTAL CALCIUM) 500 MG chewable tablet Chew 1 tablet by mouth daily as needed. For indigestion   Yes Historical Provider, MD  cephALEXin (KEFLEX) 500 MG capsule Take 500 mg by mouth 2 (two) times daily.   Yes Historical Provider, MD  clindamycin (CLEOCIN T) 1 % lotion Apply 1 application topically 2 (two) times daily.    Yes Historical Provider, MD  Eflornithine HCl (VANIQA) 13.9 %  cream Apply topically 2 (two) times daily as needed.    Yes Historical Provider, MD  Fluticasone-Salmeterol (ADVAIR) 250-50 MCG/DOSE AEPB Inhale 1 puff into the  lungs every 12 (twelve) hours.   Yes Historical Provider, MD  ibuprofen (ADVIL,MOTRIN) 800 MG tablet Take 800 mg by mouth 4 (four) times daily as needed. For pain   Yes Historical Provider, MD  ipratropium-albuterol (DUONEB) 0.5-2.5 (3) MG/3ML SOLN Take 3 mLs by nebulization 2 (two) times daily as needed. For shortness of breath 09/09/12  Yes [name], MD  losartan-hydrochlorothiazide (HYZAAR) 100-12.5 MG per tablet Take 1 tablet by mouth daily.   Yes Historical Provider, MD  magnesium citrate 1.745 GM/30ML SOLN Take 1 Bottle by mouth daily as needed. For constipation   Yes Historical Provider, MD  montelukast (SINGULAIR) 10 MG tablet Take 10 mg by mouth at bedtime.    Yes Historical Provider, MD  phentermine 30 MG capsule Take 30 mg by mouth daily.   Yes Historical Provider, MD  pravastatin (PRAVACHOL) 40 MG tablet Take 40 mg by mouth daily.   Yes Historical Provider, MD  predniSONE (DELTASONE) 5 MG tablet Take 10 mg by mouth daily. Use as directed 11/14/12  Yes [name], MD  tiotropium (SPIRIVA) 18 MCG inhalation capsule Place 18 mcg into inhaler and inhale daily.   Yes Historical Provider, MD  traMADol (ULTRAM) 50 MG tablet Take 50 mg by mouth 4 (four) times daily as needed. For pain   Yes Historical Provider, MD    Allergies  Allergen Reactions  . Codeine Other (See Comments)    Reaction unknown  . Cyclinex (Tetracycline Hcl) Other (See Comments)    Reaction unknown  . Morphine And Related Other (See Comments)    Reaction unknown    Physical Exam:  General - Obese  HEENT - No sinus tenderness, MP 3, no oral exudate, no LAN Cardiac - s1s2 regular, no murmur  Chest - decreased breath sounds, no wheeze/rales, no dullness Abdomen - soft, + bowel sounds Extremities - no edema Neurologic - normal strength Skin - no rashes  Psychiatric - normal mood, behavior   Assessment/Plan:  Monte Bronder Pager:  [phone] 06/04/2013, 1:48 PM

## 2013-06-26 ENCOUNTER — Telehealth: Payer: Self-pay | Admitting: Pulmonary Disease

## 2013-06-26 MED ORDER — METHOTREXATE 2.5 MG PO TABS: ORAL_TABLET | ORAL | Status: DC

## 2013-06-26 NOTE — Telephone Encounter (Signed)
rx has been called into the pharmacy and i have called and lmom to make the pt aware. Nothing further is needed.

## 2013-07-02 ENCOUNTER — Other Ambulatory Visit: Payer: Self-pay | Admitting: Internal Medicine

## 2013-07-25 ENCOUNTER — Other Ambulatory Visit: Payer: Self-pay | Admitting: Pulmonary Disease

## 2013-08-15 ENCOUNTER — Ambulatory Visit (INDEPENDENT_AMBULATORY_CARE_PROVIDER_SITE_OTHER): Payer: PRIVATE HEALTH INSURANCE | Admitting: Pulmonary Disease

## 2013-08-15 ENCOUNTER — Encounter: Payer: Self-pay | Admitting: Pulmonary Disease

## 2013-08-15 ENCOUNTER — Other Ambulatory Visit (INDEPENDENT_AMBULATORY_CARE_PROVIDER_SITE_OTHER): Payer: PRIVATE HEALTH INSURANCE

## 2013-08-15 VITALS — BP 108/76 | HR 90 | Ht 67.0 in | Wt 287.0 lb

## 2013-08-15 DIAGNOSIS — J99 Respiratory disorders in diseases classified elsewhere: Secondary | ICD-10-CM

## 2013-08-15 DIAGNOSIS — J449 Chronic obstructive pulmonary disease, unspecified: Secondary | ICD-10-CM

## 2013-08-15 DIAGNOSIS — D869 Sarcoidosis, unspecified: Secondary | ICD-10-CM

## 2013-08-15 DIAGNOSIS — R0600 Dyspnea, unspecified: Secondary | ICD-10-CM | POA: Insufficient documentation

## 2013-08-15 DIAGNOSIS — J4489 Other specified chronic obstructive pulmonary disease: Secondary | ICD-10-CM

## 2013-08-15 DIAGNOSIS — E669 Obesity, unspecified: Secondary | ICD-10-CM

## 2013-08-15 DIAGNOSIS — D860 Sarcoidosis of lung: Secondary | ICD-10-CM

## 2013-08-15 DIAGNOSIS — R0609 Other forms of dyspnea: Secondary | ICD-10-CM

## 2013-08-15 LAB — COMPREHENSIVE METABOLIC PANEL
ALT: 17 U/L (ref 0–35)
Albumin: 3.4 g/dL — ABNORMAL LOW (ref 3.5–5.2)
BUN: 17 mg/dL (ref 6–23)
CO2: 32 mEq/L (ref 19–32)
Calcium: 9.1 mg/dL (ref 8.4–10.5)
GFR: 62.16 mL/min (ref >60.00)
Glucose, Bld: 93 mg/dL (ref 70–99)
Potassium: 4 mEq/L (ref 3.5–5.1)
Sodium: 139 mEq/L (ref 135–145)
Total Bilirubin: 0.4 mg/dL (ref 0.3–1.2)
Total Protein: 7.3 g/dL (ref 6.0–8.3)

## 2013-08-15 LAB — CBC WITH DIFFERENTIAL/PLATELET
Eosinophils Absolute: 0.2 10*3/uL (ref 0.0–0.7)
Eosinophils Relative: 2.1 % (ref 0.0–5.0)
HCT: 36.1 % (ref 36.0–46.0)
Monocytes Relative: 8.3 % (ref 3.0–12.0)
Neutro Abs: 6.2 10*3/uL (ref 1.4–7.7)
Neutrophils Relative %: 79.5 % — ABNORMAL HIGH (ref 43.0–77.0)
Platelets: 247 10*3/uL (ref 150.0–400.0)
WBC: 7.9 10*3/uL (ref 4.5–10.5)

## 2013-08-15 NOTE — Assessment & Plan Note (Signed)
Her current symptoms are more suggestive of deconditioning in the setting of weight gain from prednisone use.  Advised her to resume a gradual exercise regimen.

## 2013-08-15 NOTE — Progress Notes (Signed)
Chief Complaint  Patient presents with  . Follow-up    Breathing is unchanged. Reports SOB. Still taking 2.5mg  of prednisone. Has hoarseness all the time.    History of Present Illness: Kaitlyn [name] is a 47 y.o. female former smoker with sarcoidosis, and COPD.  Her breathing is improved some.  She still has cough and hoarseness, but not as bad.  She is not having episodes of wheezing like before.  She denies fever, sweats or rash.  She notices getting short of breath if she walks too much at work.  She recovers after a few minutes of rest.  She continues 2.5 mg prednisone daily, and 7 pills MTX weekly.  She tried going to lower dose of prednisone, but then developed more wheeze/cough/sputum.  Tests: CT chest 01/23/12>>bulky mediastinal/hilar LAN.  Scattered ill-defined upper lobe predominate perilymphatic and centrilobular nodules. PFT 03/13/12>>FEV1 1.65 (57%), FEV1% 58, TLC 4.44 (79%), DLCO 62%, no BD EBUS with Tbx 03/01/12>>Streptococcus pneumoniae in BAL, cytology/Tbx negative CT chest 04/30/12>>No change in the appearance of the mediastinal and hilar adenopathy and pulmonary nodularity 04/30/12 start prednisone 07/30/12 FeNO>>15 ppb CT chest 07/31/12>>b/l hilar adenopathy no change, b/l upper lobe subpleural nodularity, improved LUL GGO PFT 03/18/13 >> FEV1 1.78 (69%), FEV1% 67, TLC 4.04 (75%), DLCO 92%, no BD CT chest 04/14/13 >> peribronchovascular nodularity b/l upper lobes Rt > Lt mildly progressed, no change to mediastinal/hilar adenopathy Labs 04/29/13 >> Hep B S Ag negative, Hep B S Ab reactive, Hep B core Ab negative, Hep B core IgM negative, HCV Ab negative 04/30/13 Start MTX  She  has a past medical history of Hyperlipidemia; Obesity; Anxiety; Hidradenitis; Solitary pulmonary nodule; Osteoarthritis; Varicose vein; PONV (postoperative nausea and vomiting); Hypertension; Asthma; Shortness of breath; Carpal tunnel syndrome; Depression; 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);  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); Calcium deposits in tendon and bursa; Sarcoidosis; Contracture of joint of other specified site; Cramp of limb; Other acute otitis externa; Cellulitis and abscess of finger, unspecified; Unspecified nasal polyp; Pain in joint, ankle and foot; Carpal tunnel syndrome; Pain in joint, hand; Obesity, unspecified; Keloid scar; Tobacco use disorder; Other and unspecified special symptom or syndrome, not elsewhere classified; Extrinsic asthma, unspecified; Unspecified constipation; Osteoarthrosis, unspecified whether generalized or localized, unspecified site; Pain in joint, pelvic region and thigh; and Pain in joint, lower leg.  She  has past surgical history that includes Nasal sinus surgery (2011); Other surgical history (2010); Multiple tooth extractions (2010); Bronchoscopy (03/01/12); Rhinoplasty (08/2009); and REMOVAL OF GROWTH FROM FINGER.   Prior to Admission medications   Medication Sig Start Date End Date Taking? Authorizing Provider  acetaminophen (TYLENOL) 500 MG tablet Take 500 mg by mouth every 6 (six) hours as needed. For pain   Yes Historical Provider, MD  albuterol (PROVENTIL HFA;VENTOLIN HFA) 108 (90 BASE) MCG/ACT inhaler Inhale 2 puffs into the lungs 2 (two) times daily as needed. For shortness of breath   Yes Historical Provider, MD  buPROPion (WELLBUTRIN SR) 150 MG 12 hr tablet Take 150 mg by mouth 2 (two) times daily.   Yes Historical Provider, MD  calcium carbonate (TUMS - DOSED IN MG ELEMENTAL CALCIUM) 500 MG chewable tablet Chew 1 tablet by mouth daily as needed. For indigestion   Yes Historical Provider, MD  cephALEXin (KEFLEX) 500 MG capsule Take 500 mg by mouth 2 (two) times daily.   Yes Historical Provider, MD  clindamycin (CLEOCIN T) 1 % lotion Apply 1 application topically 2 (two) times daily.    Yes Historical Provider, MD  Eflornithine HCl (VANIQA) 13.9 % cream Apply topically 2 (two) times daily as needed.    Yes Historical Provider, MD   Fluticasone-Salmeterol (ADVAIR) 250-50 MCG/DOSE AEPB Inhale 1 puff into the lungs every 12 (twelve) hours.   Yes Historical Provider, MD  ibuprofen (ADVIL,MOTRIN) 800 MG tablet Take 800 mg by mouth 4 (four) times daily as needed. For pain   Yes Historical Provider, MD  ipratropium-albuterol (DUONEB) 0.5-2.5 (3) MG/3ML SOLN Take 3 mLs by nebulization 2 (two) times daily as needed. For shortness of breath 09/09/12  Yes [name], MD  losartan-hydrochlorothiazide (HYZAAR) 100-12.5 MG per tablet Take 1 tablet by mouth daily.   Yes Historical Provider, MD  magnesium citrate 1.745 GM/30ML SOLN Take 1 Bottle by mouth daily as needed. For constipation   Yes Historical Provider, MD  montelukast (SINGULAIR) 10 MG tablet Take 10 mg by mouth at bedtime.    Yes Historical Provider, MD  phentermine 30 MG capsule Take 30 mg by mouth daily.   Yes Historical Provider, MD  pravastatin (PRAVACHOL) 40 MG tablet Take 40 mg by mouth daily.   Yes Historical Provider, MD  predniSONE (DELTASONE) 5 MG tablet Take 10 mg by mouth daily. Use as directed 11/14/12  Yes [name], MD  tiotropium (SPIRIVA) 18 MCG inhalation capsule Place 18 mcg into inhaler and inhale daily.   Yes Historical Provider, MD  traMADol (ULTRAM) 50 MG tablet Take 50 mg by mouth 4 (four) times daily as needed. For pain   Yes Historical Provider, MD    Allergies  Allergen Reactions  . Codeine Other (See Comments)    Reaction unknown  . Cyclinex [Tetracycline Hcl] Other (See Comments)    Reaction unknown  . Morphine And Related Other (See Comments)    Reaction unknown    Physical Exam:  General - Obese  HEENT - No sinus tenderness, MP 3, no oral exudate, no LAN Cardiac - s1s2 regular, no murmur  Chest - decreased breath sounds, no wheeze/rales, no dullness Abdomen - soft, + bowel sounds Extremities - no edema Neurologic - normal strength Skin - no rashes  Psychiatric - normal mood, behavior   Assessment/Plan:  Kaitlyn [name] Pager:   [phone] 08/15/2013, 2:27 PM

## 2013-08-15 NOTE — Assessment & Plan Note (Addendum)
Improved with additional of MTX.  Will repeat her labs today, and then decide if she can have dose increased.  For now will continue 2.5 mg pills and taking 7 pills once weekly.  She is to continue daily folic acid.  She has not been able to get lower than 2.5 mg prednisone per day.  Will see if she can have this adjusted down further after review of her lab tests.  Will check her CMET and CBC with diff today.  Continue Bactrim TIW while on MTX for PCP prophylaxis.  Plan to defer f/u PFT, CT chest until early 2015.

## 2013-08-15 NOTE — Assessment & Plan Note (Signed)
She is using phentermine for this.

## 2013-08-15 NOTE — Assessment & Plan Note (Signed)
Continue dulera, singulair, and prn albuterol.

## 2013-08-15 NOTE — Patient Instructions (Signed)
Flu shot today Lab tests today >> will call with results Follow up in 3 months

## 2013-08-21 ENCOUNTER — Telehealth: Payer: Self-pay | Admitting: Pulmonary Disease

## 2013-08-21 NOTE — Telephone Encounter (Signed)
I spoke with pt. Advised will forward message over to Dr. Craige Cotta regarding lab results. Please advise thanks

## 2013-08-22 NOTE — Telephone Encounter (Signed)
CMP     Component Value Date/Time   NA 139 08/15/2013 1455   K 4.0 08/15/2013 1455   CL 101 08/15/2013 1455   CO2 32 08/15/2013 1455   GLUCOSE 93 08/15/2013 1455   BUN 17 08/15/2013 1455   CREATININE 1.2 08/15/2013 1455   CALCIUM 9.1 08/15/2013 1455   PROT 7.3 08/15/2013 1455   ALBUMIN 3.4* 08/15/2013 1455   AST 20 08/15/2013 1455   ALT 17 08/15/2013 1455   ALKPHOS 92 08/15/2013 1455   BILITOT 0.4 08/15/2013 1455    CBC    Component Value Date/Time   WBC 7.9 08/15/2013 1455   RBC 3.85* 08/15/2013 1455   HGB 12.1 08/15/2013 1455   HCT 36.1 08/15/2013 1455   PLT 247.0 08/15/2013 1455   MCV 93.9 08/15/2013 1455   MCHC 33.6 08/15/2013 1455   RDW 16.8* 08/15/2013 1455   LYMPHSABS 0.8 08/15/2013 1455   MONOABS 0.7 08/15/2013 1455   EOSABS 0.2 08/15/2013 1455   BASOSABS 0.0 08/15/2013 1455    Left message on pt's voicemail detailing that lab results were normal.  She is to continue current dose of MTX and prednisone.  Advised her to call back with questions.

## 2013-09-02 ENCOUNTER — Other Ambulatory Visit: Payer: Self-pay | Admitting: Nurse Practitioner

## 2013-09-04 ENCOUNTER — Other Ambulatory Visit: Payer: Self-pay

## 2013-09-29 ENCOUNTER — Ambulatory Visit (INDEPENDENT_AMBULATORY_CARE_PROVIDER_SITE_OTHER): Payer: PRIVATE HEALTH INSURANCE | Admitting: Ophthalmology

## 2013-10-01 ENCOUNTER — Ambulatory Visit (INDEPENDENT_AMBULATORY_CARE_PROVIDER_SITE_OTHER): Payer: PRIVATE HEALTH INSURANCE | Admitting: Ophthalmology

## 2013-10-01 DIAGNOSIS — H2510 Age-related nuclear cataract, unspecified eye: Secondary | ICD-10-CM

## 2013-10-01 DIAGNOSIS — H43819 Vitreous degeneration, unspecified eye: Secondary | ICD-10-CM

## 2013-10-01 DIAGNOSIS — H33309 Unspecified retinal break, unspecified eye: Secondary | ICD-10-CM

## 2013-10-01 DIAGNOSIS — H35039 Hypertensive retinopathy, unspecified eye: Secondary | ICD-10-CM

## 2013-10-01 DIAGNOSIS — I10 Essential (primary) hypertension: Secondary | ICD-10-CM

## 2013-10-06 ENCOUNTER — Encounter: Payer: Self-pay | Admitting: Internal Medicine

## 2013-10-06 ENCOUNTER — Ambulatory Visit (INDEPENDENT_AMBULATORY_CARE_PROVIDER_SITE_OTHER): Payer: PRIVATE HEALTH INSURANCE | Admitting: Internal Medicine

## 2013-10-06 VITALS — BP 142/78 | HR 94 | Temp 98.6°F | Resp 14 | Wt 284.0 lb

## 2013-10-06 DIAGNOSIS — I10 Essential (primary) hypertension: Secondary | ICD-10-CM

## 2013-10-06 DIAGNOSIS — K5909 Other constipation: Secondary | ICD-10-CM

## 2013-10-06 DIAGNOSIS — F3289 Other specified depressive episodes: Secondary | ICD-10-CM

## 2013-10-06 DIAGNOSIS — D869 Sarcoidosis, unspecified: Secondary | ICD-10-CM

## 2013-10-06 DIAGNOSIS — E669 Obesity, unspecified: Secondary | ICD-10-CM

## 2013-10-06 DIAGNOSIS — D860 Sarcoidosis of lung: Secondary | ICD-10-CM

## 2013-10-06 DIAGNOSIS — E785 Hyperlipidemia, unspecified: Secondary | ICD-10-CM

## 2013-10-06 DIAGNOSIS — J4489 Other specified chronic obstructive pulmonary disease: Secondary | ICD-10-CM

## 2013-10-06 DIAGNOSIS — F329 Major depressive disorder, single episode, unspecified: Secondary | ICD-10-CM

## 2013-10-06 DIAGNOSIS — F32A Depression, unspecified: Secondary | ICD-10-CM

## 2013-10-06 DIAGNOSIS — M222X2 Patellofemoral disorders, left knee: Secondary | ICD-10-CM

## 2013-10-06 DIAGNOSIS — K5900 Constipation, unspecified: Secondary | ICD-10-CM

## 2013-10-06 DIAGNOSIS — M25569 Pain in unspecified knee: Secondary | ICD-10-CM

## 2013-10-06 DIAGNOSIS — J449 Chronic obstructive pulmonary disease, unspecified: Secondary | ICD-10-CM

## 2013-10-06 DIAGNOSIS — J99 Respiratory disorders in diseases classified elsewhere: Secondary | ICD-10-CM

## 2013-10-06 MED ORDER — MAGNESIUM CITRATE PO SOLN: 1.0000 | Freq: Every day | ORAL | Status: DC | PRN

## 2013-10-06 MED ORDER — BUPROPION HCL ER (SR) 150 MG PO TB12: 150.0000 mg | ORAL_TABLET | Freq: Two times a day (BID) | ORAL | Status: DC

## 2013-10-06 MED ORDER — LOSARTAN POTASSIUM-HCTZ 100-12.5 MG PO TABS: 1.0000 | ORAL_TABLET | Freq: Every day | ORAL | Status: DC

## 2013-10-06 MED ORDER — PRAVASTATIN SODIUM 40 MG PO TABS: 40.0000 mg | ORAL_TABLET | Freq: Every day | ORAL | Status: DC

## 2013-10-06 NOTE — Progress Notes (Signed)
Patient ID: TIA HIERONYMUS, female   DOB: Dec 30, 1965, 47 y.o.   MRN: [id number]   Location:  [facility]   Allergies  Allergen Reactions  . Codeine Other (See Comments)    Reaction unknown  . Cyclinex [Tetracycline Hcl] Other (See Comments)    Reaction unknown  . Morphine And Related Other (See Comments)    Reaction unknown    Chief Complaint  Patient presents with  . Annual Exam    6 month f/u   . 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    Has had no problems recently.  Still trying to deal with the weight.  Is losing fat.  Doing the stairs where she goes and walking.    Is using 5mg  prednisone for sarcoid.  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    Review of Systems:  Review of Systems  Constitutional: Negative for fever and chills.  HENT: Negative for hearing loss.        Had one really bad migraine, but it did go away--one time only  Eyes: Positive for blurred vision.       Needs glasses for distances, squinting a lot, no more flashes  Respiratory: Positive for cough and shortness of breath. Negative for wheezing.        Mostly postnasal drip, not bad coughing  Cardiovascular: Negative for chest pain and palpitations.  Gastrointestinal: Negative for heartburn, constipation, blood in stool and melena.  Genitourinary: Negative for dysuria.  Musculoskeletal: Positive for joint pain.       Hips and knees ache, left knee still swells at times and seems like it is coming out of joint on standing, patella was looser than it should be--patellofemoral syndrome  Skin: Negative for rash.  Neurological: Positive for headaches. Negative for dizziness and loss of consciousness.  Psychiatric/Behavioral: Negative for depression and memory loss. The patient does not have insomnia.        Sleeping less     Past Medical History   Diagnosis Date  . Hyperlipidemia     takes pravachol  . Obesity   . Anxiety   . Hidradenitis   . Solitary pulmonary nodule   . Osteoarthritis   . Varicose vein   . PONV (postoperative nausea and vomiting)   . Hypertension     takes medications daily  . Asthma     inhalers   . Shortness of breath     exertion  . Carpal tunnel syndrome     bilateral  . Depression     takes wellbutrin  . 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 Calcium deposits in tendon and bursa   . Sarcoidosis   . Contracture of joint of other specified site   . Cramp of limb   . Other acute otitis externa   . Cellulitis and abscess of finger, unspecified   . Unspecified nasal polyp   . Pain in joint, ankle and foot   . Carpal tunnel syndrome   . Pain in joint, hand   .  Obesity, unspecified   . Keloid scar   . Tobacco use disorder   . Other and unspecified special symptom or syndrome, not elsewhere classified   . Extrinsic asthma, unspecified   . Unspecified constipation   . Osteoarthrosis, unspecified whether generalized or localized, unspecified site   . Pain in joint, pelvic region and thigh   . Pain in joint, lower leg     Past Surgical History  Procedure Laterality Date  . Nasal sinus surgery  2011  . Other surgical history  2010    cyst removal from right middle finger    . Multiple tooth extractions  2010  . Bronchoscopy  03/01/12  . Rhinoplasty  08/2009  . Removal of growth from finger      DR Metro Kung    Social History:   reports that she quit smoking about 20 months ago. Her smoking use included Cigarettes. She has a 35 pack-year smoking history. She has never used  smokeless tobacco. She reports that she drinks alcohol. She reports that she does not use illicit drugs.  Family History  Problem Relation Age of Onset  . Cancer Maternal Aunt     mother's aunt  . Diabetes Maternal Aunt   . Anesthesia problems Neg Hx   . Hypotension Neg Hx   . Malignant hyperthermia Neg Hx   . Pseudochol deficiency Neg Hx     Medications: Patient's Medications  New Prescriptions   No medications on file  Previous Medications   ACETAMINOPHEN (TYLENOL) 500 MG TABLET    Take 500 mg by mouth every 6 (six) hours as needed. For pain   ALBUTEROL (PROVENTIL HFA;VENTOLIN HFA) 108 (90 BASE) MCG/ACT INHALER    Inhale 2 puffs into the lungs 2 (two) times daily as needed. For shortness of breath   BUPROPION (WELLBUTRIN SR) 150 MG 12 HR TABLET    TAKE 1 TABLET BY MOUTH TWICE A DAY   CALCIUM CARBONATE (TUMS - DOSED IN MG ELEMENTAL CALCIUM) 500 MG CHEWABLE TABLET    Chew 1 tablet by mouth daily as needed. For indigestion   CEPHALEXIN (KEFLEX) 500 MG CAPSULE    Take 500 mg by mouth 2 (two) times daily.   CLINDAMYCIN (CLEOCIN T) 1 % LOTION    Apply 1 application topically as needed.    EFLORNITHINE HCL (VANIQA) 13.9 % CREAM    Apply topically 2 (two) times daily as needed.    FOLIC ACID (FOLVITE) 1 MG TABLET    Take 1 tablet (1 mg total) by mouth daily.   IBUPROFEN (ADVIL,MOTRIN) 800 MG TABLET    Take 800 mg by mouth 4 (four) times daily as needed. For pain   IPRATROPIUM-ALBUTEROL (DUONEB) 0.5-2.5 (3) MG/3ML SOLN    Take 3 mLs by nebulization 2 (two) times daily as needed. For shortness of breath   LOSARTAN-HYDROCHLOROTHIAZIDE (HYZAAR) 100-12.5 MG PER TABLET    Take 1 tablet by mouth daily.   MAGNESIUM CITRATE 1.745 GM/30ML SOLN    Take 1 Bottle by mouth daily as needed. For constipation   METHOTREXATE (RHEUMATREX) 2.5 MG TABLET    TAKE 7 TABLETS BY MOUTH ONCE A WEEK   MOMETASONE-FORMOTEROL (DULERA) 100-5 MCG/ACT AERO    Inhale 2 puffs into the lungs 2 (two) times daily.   MONTELUKAST  (SINGULAIR) 10 MG TABLET    Take 1 tablet (10 mg total) by mouth at bedtime.   PHENTERMINE 30 MG CAPSULE    Take 30 mg by mouth daily.   PRAVASTATIN (PRAVACHOL) 40 MG TABLET  Take 1 tablet (40 mg total) by mouth daily.   PREDNISONE (DELTASONE) 5 MG TABLET    TAKE AS DIRECTED   SULFAMETHOXAZOLE-TRIMETHOPRIM (BACTRIM) 400-80 MG PER TABLET    Take 1 tablet by mouth 3 (three) times a week.   TRAMADOL (ULTRAM) 50 MG TABLET    Take 50 mg by mouth 4 (four) times daily as needed. For pain  Modified Medications   No medications on file  Discontinued Medications   No medications on file     Physical Exam: Filed Vitals:   10/06/13 0850  BP: 142/78  Pulse: 94  Temp: 98.6 F (37 C)  TempSrc: Oral  Resp: 14  Weight: 284 lb (128.822 kg)  SpO2: 99%  Physical Exam  Constitutional: She is oriented to person, place, and time. She appears well-developed and well-nourished. No distress.  Obese, NAD  HENT:  Head: Normocephalic and atraumatic.  Cardiovascular: Normal rate, regular rhythm, normal heart sounds and intact distal pulses.   No murmur heard. Pulmonary/Chest: Effort normal.  Brief inspiratory wheeze left upper posterior lung field  Abdominal: Soft. Bowel sounds are normal. She exhibits no distension. There is no tenderness.  Musculoskeletal: Normal range of motion. She exhibits no tenderness.  Mild left medial knee swelling  Neurological: She is alert and oriented to person, place, and time.  Skin: Skin is warm and dry.     Labs reviewed: Basic Metabolic Panel:  Recent Labs  60/45/40 0835 08/15/13 1455  NA 139 139  K 4.5 4.0  CL 98 101  CO2 28 32  GLUCOSE 81 93  BUN 12 17  CREATININE 1.11* 1.2  CALCIUM 10.3* 9.1   Liver Function Tests:  Recent Labs  04/08/13 0835 08/15/13 1455  AST 24 20  ALT 15 17  ALKPHOS 99 92  BILITOT 0.3 0.4  PROT 7.5 7.3  ALBUMIN  --  3.4*  CBC:  Recent Labs  04/08/13 0835 08/15/13 1455  WBC 6.3 7.9  NEUTROABS 4.6 6.2  HGB 13.2  12.1  HCT 39.3 36.1  MCV 90 93.9  PLT  --  247.0   Lipid Panel:  Recent Labs  04/08/13 0835  HDL 57  LDLCALC 85  TRIG 81  CHOLHDL 2.8   Lab Results  Component Value Date   HGBA1C 5.9* 04/08/2013   Assessment/Plan 1. Obesity -is walking more and using the stairs, but has not been losing lbs-feels she has gained muscle though - recheck Hemoglobin A1c--last one was 5.9 in June -affected by continuous prednisone -encouraged her to continue her walking and stairs to help with weight loss and try to counteract the prednisone effect  2. Pulmonary sarcoidosis -stable, only abnormal lung sounds today seems more upper airway with postnasal drip/congestion, otherwise clear -continues to use prednisone 5mg  daily  3. COPD with chronic bronchitis -follows with Dr. Craige Cotta -cont singulair, albuterol, dulera as prescribed by Dr. Craige Cotta  4. Hypertension -appears anxious when she comes in, bp was a little bit high this am (was running late)--cont to monitor--she also checks at work sometimes - refilled losartan-hydrochlorothiazide (HYZAAR) 100-12.5 MG per tablet; Take 1 tablet by mouth daily.  Dispense: 90 tablet; Refill: 3  5. Hyperlipidemia -at goal in June, recheck June 2015 - pravastatin (PRAVACHOL) 40 MG tablet; Take 1 tablet (40 mg total) by mouth daily.  Dispense: 90 tablet; Refill: 3  6. Depression -says her mood is good lately -refilled her wellbutrin - buPROPion (WELLBUTRIN SR) 150 MG 12 hr tablet; Take 1 tablet (150 mg total) by mouth  2 (two) times daily.  Dispense: 180 tablet; Refill: 3  7. Chronic constipation -not recently problematic, walking more, more active - magnesium citrate SOLN; Take 296 mLs (1 Bottle total) by mouth daily as needed for severe constipation. For constipation  Dispense: 300 mL; Refill: 3  8. Patellofemoral syndrome, left -has been stable, sometimes uses a brace for stability -has minimal pain, primarily on standing and feels like it is coming out of  place -rare use of ibuprofen  Labs/tests ordered:  hba1c today Next appt:  6 mos for remainder of labs

## 2013-10-07 ENCOUNTER — Encounter: Payer: Self-pay | Admitting: *Deleted

## 2013-10-07 LAB — HEMOGLOBIN A1C
Est. average glucose Bld gHb Est-mCnc: 114 mg/dL
Hgb A1c MFr Bld: 5.6 % (ref 4.8–5.6)

## 2013-10-09 ENCOUNTER — Other Ambulatory Visit: Payer: Self-pay | Admitting: *Deleted

## 2013-10-09 MED ORDER — TRAMADOL HCL 50 MG PO TABS: 50.0000 mg | ORAL_TABLET | Freq: Four times a day (QID) | ORAL | Status: DC | PRN

## 2013-10-14 ENCOUNTER — Telehealth: Payer: Self-pay | Admitting: Pulmonary Disease

## 2013-10-14 NOTE — Telephone Encounter (Signed)
Called patient to schd a recall apt x3. No return call back. sent letter 10/14/13

## 2013-10-26 ENCOUNTER — Other Ambulatory Visit: Payer: Self-pay | Admitting: Nurse Practitioner

## 2013-10-26 ENCOUNTER — Other Ambulatory Visit: Payer: Self-pay | Admitting: Pulmonary Disease

## 2013-10-26 ENCOUNTER — Other Ambulatory Visit: Payer: Self-pay | Admitting: Internal Medicine

## 2013-10-30 ENCOUNTER — Other Ambulatory Visit: Payer: Self-pay | Admitting: Pulmonary Disease

## 2013-12-04 ENCOUNTER — Ambulatory Visit (INDEPENDENT_AMBULATORY_CARE_PROVIDER_SITE_OTHER): Payer: PRIVATE HEALTH INSURANCE | Admitting: Pulmonary Disease

## 2013-12-04 ENCOUNTER — Encounter: Payer: Self-pay | Admitting: Pulmonary Disease

## 2013-12-04 VITALS — BP 130/82 | HR 104 | Ht 67.0 in | Wt 286.0 lb

## 2013-12-04 DIAGNOSIS — M79609 Pain in unspecified limb: Secondary | ICD-10-CM

## 2013-12-04 DIAGNOSIS — D869 Sarcoidosis, unspecified: Secondary | ICD-10-CM

## 2013-12-04 DIAGNOSIS — D860 Sarcoidosis of lung: Secondary | ICD-10-CM

## 2013-12-04 DIAGNOSIS — J449 Chronic obstructive pulmonary disease, unspecified: Secondary | ICD-10-CM

## 2013-12-04 DIAGNOSIS — M79605 Pain in left leg: Secondary | ICD-10-CM | POA: Insufficient documentation

## 2013-12-04 DIAGNOSIS — J99 Respiratory disorders in diseases classified elsewhere: Secondary | ICD-10-CM

## 2013-12-04 NOTE — Progress Notes (Signed)
Chief Complaint  Patient presents with  . Follow-up    c/o:  since stopping pred 2.5 mg daily x1 week ago,is experiencing sob, wheezing and cough    History of Present Illness: Kaitlyn [name] is a 48 y.o. female former smoker with sarcoidosis, and COPD.  She has been off prednisone for the past one week.  Her breathing has been about the same.  She gets wheezy/winded with exercise.  She also gets wheezy in her throat sometimes.  She has noticed more back and Lt leg/knee pain.  Tests: CT chest 01/23/12>>bulky mediastinal/hilar LAN.  Scattered ill-defined upper lobe predominate perilymphatic and centrilobular nodules. PFT 03/13/12>>FEV1 1.65 (57%), FEV1% 58, TLC 4.44 (79%), DLCO 62%, no BD EBUS with Tbx 03/01/12>>Streptococcus pneumoniae in BAL, cytology/Tbx negative CT chest 04/30/12>>No change in the appearance of the mediastinal and hilar adenopathy and pulmonary nodularity 04/30/12 start prednisone 07/30/12 FeNO>>15 ppb CT chest 07/31/12>>b/l hilar adenopathy no change, b/l upper lobe subpleural nodularity, improved LUL GGO PFT 03/18/13 >> FEV1 1.78 (69%), FEV1% 67, TLC 4.04 (75%), DLCO 92%, no BD CT chest 04/14/13 >> peribronchovascular nodularity b/l upper lobes Rt > Lt mildly progressed, no change to mediastinal/hilar adenopathy Labs 04/29/13 >> Hep B S Ag negative, Hep B S Ab reactive, Hep B core Ab negative, Hep B core IgM negative, HCV Ab negative 04/30/13 Start MTX  She  has a past medical history of Hyperlipidemia; Obesity; Anxiety; Hidradenitis; Solitary pulmonary nodule; Osteoarthritis; Varicose vein; PONV (postoperative nausea and vomiting); Hypertension; Asthma; Shortness of breath; Carpal tunnel syndrome; Depression; 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); 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); Calcium deposits in tendon and bursa; Sarcoidosis; Contracture of joint of other specified site; Cramp of limb; Other acute otitis externa; Cellulitis and abscess of finger, unspecified;  Unspecified nasal polyp; Pain in joint, ankle and foot; Carpal tunnel syndrome; Pain in joint, hand; Obesity, unspecified; Keloid scar; Tobacco use disorder; Other and unspecified special symptom or syndrome, not elsewhere classified; Extrinsic asthma, unspecified; Unspecified constipation; Osteoarthrosis, unspecified whether generalized or localized, unspecified site; Pain in joint, pelvic region and thigh; and Pain in joint, lower leg.  She  has past surgical history that includes Nasal sinus surgery (2011); Other surgical history (2010); Multiple tooth extractions (2010); Bronchoscopy (03/01/12); Rhinoplasty (08/2009); and REMOVAL OF GROWTH FROM FINGER.   Prior to Admission medications   Medication Sig Start Date End Date Taking? Authorizing Provider  acetaminophen (TYLENOL) 500 MG tablet Take 500 mg by mouth every 6 (six) hours as needed. For pain   Yes Historical Provider, MD  albuterol (PROVENTIL HFA;VENTOLIN HFA) 108 (90 BASE) MCG/ACT inhaler Inhale 2 puffs into the lungs 2 (two) times daily as needed. For shortness of breath   Yes Historical Provider, MD  buPROPion (WELLBUTRIN SR) 150 MG 12 hr tablet Take 150 mg by mouth 2 (two) times daily.   Yes Historical Provider, MD  calcium carbonate (TUMS - DOSED IN MG ELEMENTAL CALCIUM) 500 MG chewable tablet Chew 1 tablet by mouth daily as needed. For indigestion   Yes Historical Provider, MD  cephALEXin (KEFLEX) 500 MG capsule Take 500 mg by mouth 2 (two) times daily.   Yes Historical Provider, MD  clindamycin (CLEOCIN T) 1 % lotion Apply 1 application topically 2 (two) times daily.    Yes Historical Provider, MD  Eflornithine HCl (VANIQA) 13.9 % cream Apply topically 2 (two) times daily as needed.    Yes Historical Provider, MD  Fluticasone-Salmeterol (ADVAIR) 250-50 MCG/DOSE AEPB Inhale 1 puff into the lungs every 12 (twelve) hours.  Yes Historical Provider, MD  ibuprofen (ADVIL,MOTRIN) 800 MG tablet Take 800 mg by mouth 4 (four) times daily as  needed. For pain   Yes Historical Provider, MD  ipratropium-albuterol (DUONEB) 0.5-2.5 (3) MG/3ML SOLN Take 3 mLs by nebulization 2 (two) times daily as needed. For shortness of breath 09/09/12  Yes [name], MD  losartan-hydrochlorothiazide (HYZAAR) 100-12.5 MG per tablet Take 1 tablet by mouth daily.   Yes Historical Provider, MD  magnesium citrate 1.745 GM/30ML SOLN Take 1 Bottle by mouth daily as needed. For constipation   Yes Historical Provider, MD  montelukast (SINGULAIR) 10 MG tablet Take 10 mg by mouth at bedtime.    Yes Historical Provider, MD  phentermine 30 MG capsule Take 30 mg by mouth daily.   Yes Historical Provider, MD  pravastatin (PRAVACHOL) 40 MG tablet Take 40 mg by mouth daily.   Yes Historical Provider, MD  predniSONE (DELTASONE) 5 MG tablet Take 10 mg by mouth daily. Use as directed 11/14/12  Yes [name], MD  tiotropium (SPIRIVA) 18 MCG inhalation capsule Place 18 mcg into inhaler and inhale daily.   Yes Historical Provider, MD  traMADol (ULTRAM) 50 MG tablet Take 50 mg by mouth 4 (four) times daily as needed. For pain   Yes Historical Provider, MD    Allergies  Allergen Reactions  . Codeine Other (See Comments)    Reaction unknown  . Cyclinex [Tetracycline Hcl] Other (See Comments)    Reaction unknown  . Morphine And Related Other (See Comments)    Reaction unknown    Physical Exam:  General - Obese  HEENT - No sinus tenderness, MP 3, no oral exudate, no LAN Cardiac - s1s2 regular, no murmur  Chest - decreased breath sounds, no wheeze/rales, no dullness Abdomen - soft, + bowel sounds Extremities - no edema Neurologic - normal strength Skin - no rashes  Psychiatric - normal mood, behavior   Assessment/Plan:  Kaitlyn [name] Pager:  [phone] 12/04/2013, 10:18 AM

## 2013-12-04 NOTE — Assessment & Plan Note (Addendum)
Continue dulera, spiriva, singulair, and prn albuterol.

## 2013-12-04 NOTE — Patient Instructions (Signed)
Will schedule breathing test (PFT) and CT chest Follow up in 2 months

## 2013-12-04 NOTE — Assessment & Plan Note (Signed)
Advised her to d/w her PCP if this persists.

## 2013-12-04 NOTE — Assessment & Plan Note (Signed)
She is to continue 2.5 mg MTX with 7 pills weekly.  Will see how she does off prednisone.  Will arrange for repeat CT chest with contrast and PFT.

## 2013-12-09 ENCOUNTER — Inpatient Hospital Stay: Admission: RE | Admit: 2013-12-09 | Payer: PRIVATE HEALTH INSURANCE | Source: Ambulatory Visit

## 2013-12-16 ENCOUNTER — Other Ambulatory Visit: Payer: PRIVATE HEALTH INSURANCE

## 2013-12-23 ENCOUNTER — Other Ambulatory Visit: Payer: PRIVATE HEALTH INSURANCE

## 2014-01-01 ENCOUNTER — Other Ambulatory Visit: Payer: PRIVATE HEALTH INSURANCE

## 2014-01-05 ENCOUNTER — Ambulatory Visit (INDEPENDENT_AMBULATORY_CARE_PROVIDER_SITE_OTHER)
Admission: RE | Admit: 2014-01-05 | Discharge: 2014-01-05 | Disposition: A | Discharge status: Home / Self Care | Payer: PRIVATE HEALTH INSURANCE | Source: Ambulatory Visit | Attending: Pulmonary Disease | Admitting: Pulmonary Disease

## 2014-01-05 ENCOUNTER — Other Ambulatory Visit: Payer: Self-pay | Admitting: Pulmonary Disease

## 2014-01-05 DIAGNOSIS — D869 Sarcoidosis, unspecified: Secondary | ICD-10-CM

## 2014-01-05 DIAGNOSIS — D860 Sarcoidosis of lung: Secondary | ICD-10-CM

## 2014-01-05 DIAGNOSIS — J99 Respiratory disorders in diseases classified elsewhere: Secondary | ICD-10-CM

## 2014-01-05 DIAGNOSIS — J449 Chronic obstructive pulmonary disease, unspecified: Secondary | ICD-10-CM

## 2014-01-05 MED ORDER — IOHEXOL 300 MG/ML  SOLN
80.0000 mL | Freq: Once | INTRAMUSCULAR | Status: AC | PRN
  Administered 2014-01-05: 80 mL via INTRAVENOUS

## 2014-01-06 ENCOUNTER — Telehealth: Payer: Self-pay | Admitting: Pulmonary Disease

## 2014-01-06 NOTE — Telephone Encounter (Signed)
01/05/2014    CLINICAL DATA:  COPD and sarcoidosis.  Chronic cough.   EXAM: CT CHEST WITH CONTRAST   TECHNIQUE: Multidetector CT imaging of the chest was performed during intravenous contrast administration.   CONTRAST:  80mL OMNIPAQUE IOHEXOL 300 MG/ML  SOLN   COMPARISON:  CT CHEST W/CM dated 04/14/2013; CT CHEST W/CM dated 04/30/2012   FINDINGS:  Lungs/Pleura: Minimal progression of upper lobe predominant peribronchovascular reticular nodular interstitial opacities. No lobar consolidation. No pleural fluid.  Heart/Mediastinum: Aortic and branch vessel atherosclerosis. Normal heart size, without pericardial effusion. Confluent mediastinal adenopathy again identified. A right paratracheal node measures 1.2 cm on image 15 versus 1.3 cm on the prior.  A subcarinal node measures 1.6 cm today versus 1.4 cm on the prior.  Right hilar node measures 1.7 cm on image 21 versus 1.6 cm on the prior.  Prevascular node measures 1.3 cm today versus 1.0 cm on the prior.  Upper Abdomen:  No significant findings.  Bones/Musculoskeletal:  No acute osseous abnormality.   IMPRESSION:  1. Slight progression of pulmonary parenchymal and thoracic nodal findings which are consistent with the clinical history of sarcoidosis.  2. No evidence of acute superimposed process.    Electronically Signed   By: Jeronimo GreavesKyle  Talbot M.D.   On: 01/05/2014 10:42    Attempt to call pt to discuss results.  Will have my nurse inform pt that her CT chest is stable, and no change to medications unless she is having more symptoms.  If she is otherwise stable, then she will need ROV after her PFT's are done.

## 2014-01-07 NOTE — Telephone Encounter (Signed)
lmtcb x1 

## 2014-01-07 NOTE — Telephone Encounter (Signed)
Pt returning cal can be reached @ [phone].Caren GriffinsStanley A Dalton

## 2014-01-07 NOTE — Telephone Encounter (Signed)
I spoke with patient about results and she verbalized understanding and had no questions. appt's already scheduled.

## 2014-01-13 ENCOUNTER — Ambulatory Visit (INDEPENDENT_AMBULATORY_CARE_PROVIDER_SITE_OTHER): Payer: PRIVATE HEALTH INSURANCE | Admitting: Pulmonary Disease

## 2014-01-13 DIAGNOSIS — D860 Sarcoidosis of lung: Secondary | ICD-10-CM

## 2014-01-13 DIAGNOSIS — J449 Chronic obstructive pulmonary disease, unspecified: Secondary | ICD-10-CM

## 2014-01-13 NOTE — Progress Notes (Signed)
PFT done today. 

## 2014-01-19 ENCOUNTER — Other Ambulatory Visit: Payer: Self-pay | Admitting: Pulmonary Disease

## 2014-01-23 ENCOUNTER — Encounter: Payer: Self-pay | Admitting: Pulmonary Disease

## 2014-01-23 ENCOUNTER — Other Ambulatory Visit (INDEPENDENT_AMBULATORY_CARE_PROVIDER_SITE_OTHER): Payer: PRIVATE HEALTH INSURANCE

## 2014-01-23 ENCOUNTER — Ambulatory Visit (INDEPENDENT_AMBULATORY_CARE_PROVIDER_SITE_OTHER): Payer: PRIVATE HEALTH INSURANCE | Admitting: Pulmonary Disease

## 2014-01-23 ENCOUNTER — Telehealth: Payer: Self-pay | Admitting: Pulmonary Disease

## 2014-01-23 VITALS — BP 138/84 | HR 91 | Temp 97.9°F | Ht 67.0 in | Wt 278.6 lb

## 2014-01-23 DIAGNOSIS — J449 Chronic obstructive pulmonary disease, unspecified: Secondary | ICD-10-CM

## 2014-01-23 DIAGNOSIS — J99 Respiratory disorders in diseases classified elsewhere: Secondary | ICD-10-CM

## 2014-01-23 DIAGNOSIS — D860 Sarcoidosis of lung: Secondary | ICD-10-CM

## 2014-01-23 DIAGNOSIS — D869 Sarcoidosis, unspecified: Secondary | ICD-10-CM

## 2014-01-23 LAB — CBC
HCT: 38.3 % (ref 36.0–46.0)
Hemoglobin: 12.8 g/dL (ref 12.0–15.0)
MCHC: 33.5 g/dL (ref 30.0–36.0)
MCV: 93.9 fl (ref 78.0–100.0)
Platelets: 255 K/uL (ref 150.0–400.0)
RBC: 4.08 Mil/uL (ref 3.87–5.11)
RDW: 14.4 % (ref 11.5–14.6)
WBC: 7.2 K/uL (ref 4.5–10.5)

## 2014-01-23 LAB — COMPREHENSIVE METABOLIC PANEL WITH GFR
ALT: 17 U/L (ref 0–35)
AST: 23 U/L (ref 0–37)
Albumin: 3.6 g/dL (ref 3.5–5.2)
Alkaline Phosphatase: 102 U/L (ref 39–117)
BUN: 10 mg/dL (ref 6–23)
CO2: 30 meq/L (ref 19–32)
Calcium: 9.6 mg/dL (ref 8.4–10.5)
Chloride: 100 meq/L (ref 96–112)
Creatinine, Ser: 1.1 mg/dL (ref 0.4–1.2)
GFR: 65.86 mL/min
Glucose, Bld: 87 mg/dL (ref 70–99)
Potassium: 3.8 meq/L (ref 3.5–5.1)
Sodium: 136 meq/L (ref 135–145)
Total Bilirubin: 0.5 mg/dL (ref 0.3–1.2)
Total Protein: 7.7 g/dL (ref 6.0–8.3)

## 2014-01-23 NOTE — Patient Instructions (Signed)
Lab tests today>>will call with results Follow up in 3 months 

## 2014-01-23 NOTE — Progress Notes (Signed)
Chief Complaint  Patient presents with  . Follow-up    Pt states breathing is doing well. Pt states her breathing only acts up when she doesnt use inhalers. No other complaints.     History of Present Illness: Kaitlyn [name] is a 48 y.o. female former smoker with sarcoidosis, and COPD.  She has been doing much better with her breathing.  She is not having as much cough, congestion, or wheezing.  She is not needing to use albuterol as much.  She does not feel like her breathing has gotten any worse since being off prednisone.  She denies chest pain, fever, or skin rashes.   Tests: CT chest 01/23/12>>bulky mediastinal/hilar LAN.  Scattered ill-defined upper lobe predominate perilymphatic and centrilobular nodules. PFT 03/13/12>>FEV1 1.65 (57%), FEV1% 58, TLC 4.44 (79%), DLCO 62%, no BD EBUS with Tbx 03/01/12>>Streptococcus pneumoniae in BAL, cytology/Tbx negative CT chest 04/30/12>>No change in the appearance of the mediastinal and hilar adenopathy and pulmonary nodularity 04/30/12 start prednisone CT chest 07/31/12>>b/l hilar adenopathy no change, b/l upper lobe subpleural nodularity, improved LUL GGO PFT 03/18/13 >> FEV1 1.78 (69%), FEV1% 67, TLC 4.04 (75%), DLCO 92%, no BD CT chest 04/14/13 >> peribronchovascular nodularity b/l upper lobes Rt > Lt mildly progressed, no change to mediastinal/hilar adenopathy Labs 04/29/13 >> Hep B S Ag negative, Hep B S Ab reactive, Hep B core Ab negative, Hep B core IgM negative, HCV Ab negative 04/30/13 Start MTX 11/30/13 Off prednisone CT chest 01/05/14 >> upper lobe predominant prebronchovascular reticulo-nodule opacities, 1.2 cm Rt paratracheal LAN, 1.6 cm subcarinal LAN, 1.7 cm Rt hilar LAN  PFT 01/13/14 >> FEV1 1.64 (70%), FEV1% 70, TLC 3.69 (68%), DLCO 79%  She  has a past medical history of Hyperlipidemia; Obesity; Anxiety; Hidradenitis; Solitary pulmonary nodule; Osteoarthritis; Varicose vein; PONV (postoperative nausea and vomiting); Hypertension;  Asthma; Shortness of breath; Carpal tunnel syndrome; Depression; 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); 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); Calcium deposits in tendon and bursa; Sarcoidosis; Contracture of joint of other specified site; Cramp of limb; Other acute otitis externa; Cellulitis and abscess of finger, unspecified; Unspecified nasal polyp; Pain in joint, ankle and foot; Carpal tunnel syndrome; Pain in joint, hand; Obesity, unspecified; Keloid scar; Tobacco use disorder; Other and unspecified special symptom or syndrome, not elsewhere classified; Extrinsic asthma, unspecified; Unspecified constipation; Osteoarthrosis, unspecified whether generalized or localized, unspecified site; Pain in joint, pelvic region and thigh; and Pain in joint, lower leg.  She  has past surgical history that includes Nasal sinus surgery (2011); Other surgical history (2010); Multiple tooth extractions (2010); Bronchoscopy (03/01/12); Rhinoplasty (08/2009); and REMOVAL OF GROWTH FROM FINGER.   Prior to Admission medications   Medication Sig Start Date End Date Taking? Authorizing Provider  acetaminophen (TYLENOL) 500 MG tablet Take 500 mg by mouth every 6 (six) hours as needed. For pain   Yes Historical Provider, MD  albuterol (PROVENTIL HFA;VENTOLIN HFA) 108 (90 BASE) MCG/ACT inhaler Inhale 2 puffs into the lungs 2 (two) times daily as needed. For shortness of breath   Yes Historical Provider, MD  buPROPion (WELLBUTRIN SR) 150 MG 12 hr tablet Take 150 mg by mouth 2 (two) times daily.   Yes Historical Provider, MD  calcium carbonate (TUMS - DOSED IN MG ELEMENTAL CALCIUM) 500 MG chewable tablet Chew 1 tablet by mouth daily as needed. For indigestion   Yes Historical Provider, MD  cephALEXin (KEFLEX) 500 MG capsule Take 500 mg by mouth 2 (two) times daily.   Yes Historical Provider, MD  clindamycin (CLEOCIN  T) 1 % lotion Apply 1 application topically 2 (two) times daily.    Yes Historical Provider, MD  Eflornithine  HCl (VANIQA) 13.9 % cream Apply topically 2 (two) times daily as needed.    Yes Historical Provider, MD  Fluticasone-Salmeterol (ADVAIR) 250-50 MCG/DOSE AEPB Inhale 1 puff into the lungs every 12 (twelve) hours.   Yes Historical Provider, MD  ibuprofen (ADVIL,MOTRIN) 800 MG tablet Take 800 mg by mouth 4 (four) times daily as needed. For pain   Yes Historical Provider, MD  ipratropium-albuterol (DUONEB) 0.5-2.5 (3) MG/3ML SOLN Take 3 mLs by nebulization 2 (two) times daily as needed. For shortness of breath 09/09/12  Yes [name], MD  losartan-hydrochlorothiazide (HYZAAR) 100-12.5 MG per tablet Take 1 tablet by mouth daily.   Yes Historical Provider, MD  magnesium citrate 1.745 GM/30ML SOLN Take 1 Bottle by mouth daily as needed. For constipation   Yes Historical Provider, MD  montelukast (SINGULAIR) 10 MG tablet Take 10 mg by mouth at bedtime.    Yes Historical Provider, MD  phentermine 30 MG capsule Take 30 mg by mouth daily.   Yes Historical Provider, MD  pravastatin (PRAVACHOL) 40 MG tablet Take 40 mg by mouth daily.   Yes Historical Provider, MD  predniSONE (DELTASONE) 5 MG tablet Take 10 mg by mouth daily. Use as directed 11/14/12  Yes [name], MD  tiotropium (SPIRIVA) 18 MCG inhalation capsule Place 18 mcg into inhaler and inhale daily.   Yes Historical Provider, MD  traMADol (ULTRAM) 50 MG tablet Take 50 mg by mouth 4 (four) times daily as needed. For pain   Yes Historical Provider, MD    Allergies  Allergen Reactions  . Codeine Other (See Comments)    Reaction unknown  . Cyclinex [Tetracycline Hcl] Other (See Comments)    Reaction unknown, all "cyclines"  . Morphine And Related Other (See Comments)    Reaction unknown    Physical Exam:  General - Obese  HEENT - No sinus tenderness, MP 3, no oral exudate, no LAN Cardiac - s1s2 regular, no murmur  Chest - decreased breath sounds, no wheeze/rales, no dullness Abdomen - soft, + bowel sounds Extremities - no edema Neurologic  - normal strength Skin - no rashes  Psychiatric - normal mood, behavior   Assessment/Plan:  [name], MD  Pulmonary/Critical Care Pager:  [phone]

## 2014-01-23 NOTE — Telephone Encounter (Signed)
CMP     Component Value Date/Time   NA 136 01/23/2014 1059   K 3.8 01/23/2014 1059   CL 100 01/23/2014 1059   CO2 30 01/23/2014 1059   GLUCOSE 87 01/23/2014 1059   BUN 10 01/23/2014 1059   CREATININE 1.1 01/23/2014 1059   CALCIUM 9.6 01/23/2014 1059   PROT 7.7 01/23/2014 1059   ALBUMIN 3.6 01/23/2014 1059   AST 23 01/23/2014 1059   ALT 17 01/23/2014 1059   ALKPHOS 102 01/23/2014 1059   BILITOT 0.5 01/23/2014 1059   CBC    Component Value Date/Time   WBC 7.2 01/23/2014 1059   RBC 4.08 01/23/2014 1059   HGB 12.8 01/23/2014 1059   HCT 38.3 01/23/2014 1059   PLT 255.0 01/23/2014 1059   MCV 93.9 01/23/2014 1059   MCH 30.0 02/29/2012 1440   MCHC 33.5 01/23/2014 1059   RDW 14.4 01/23/2014 1059    Will have my nurse inform pt that labs are normal.  No change to current treatment plan.

## 2014-01-26 NOTE — Telephone Encounter (Signed)
Pt is aware of results. 

## 2014-01-28 NOTE — Assessment & Plan Note (Signed)
Improved since last visit.  Continue dulera, spiriva, singulair, and prn albuterol.

## 2014-01-28 NOTE — Assessment & Plan Note (Signed)
She is to continue 2.5 mg MTX with 7 pills weekly.  Recent CT chest and PFT are relatively stable, and she has done well with transition off prednisone.  Will repeat her lab work and call her with results.  She is to continue bactrim prophylaxis while on MTX.

## 2014-02-06 ENCOUNTER — Other Ambulatory Visit: Payer: Self-pay | Admitting: Pulmonary Disease

## 2014-02-17 ENCOUNTER — Other Ambulatory Visit: Payer: Self-pay | Admitting: Pulmonary Disease

## 2014-03-06 ENCOUNTER — Other Ambulatory Visit: Payer: Self-pay | Admitting: Internal Medicine

## 2014-03-13 ENCOUNTER — Ambulatory Visit: Payer: PRIVATE HEALTH INSURANCE | Admitting: Pulmonary Disease

## 2014-03-14 ENCOUNTER — Other Ambulatory Visit: Payer: Self-pay | Admitting: Pulmonary Disease

## 2014-03-26 ENCOUNTER — Other Ambulatory Visit: Payer: Self-pay | Admitting: Internal Medicine

## 2014-04-18 ENCOUNTER — Other Ambulatory Visit: Payer: Self-pay | Admitting: Internal Medicine

## 2014-04-27 ENCOUNTER — Encounter: Payer: Self-pay | Admitting: Pulmonary Disease

## 2014-04-27 ENCOUNTER — Ambulatory Visit (INDEPENDENT_AMBULATORY_CARE_PROVIDER_SITE_OTHER): Payer: PRIVATE HEALTH INSURANCE | Admitting: Pulmonary Disease

## 2014-04-27 VITALS — BP 144/92 | HR 86

## 2014-04-27 DIAGNOSIS — J449 Chronic obstructive pulmonary disease, unspecified: Secondary | ICD-10-CM

## 2014-04-27 DIAGNOSIS — J99 Respiratory disorders in diseases classified elsewhere: Secondary | ICD-10-CM

## 2014-04-27 DIAGNOSIS — D860 Sarcoidosis of lung: Secondary | ICD-10-CM

## 2014-04-27 DIAGNOSIS — D869 Sarcoidosis, unspecified: Secondary | ICD-10-CM

## 2014-04-27 NOTE — Patient Instructions (Signed)
Follow up in 3 months with CBC, CMET, and TSH

## 2014-04-27 NOTE — Progress Notes (Signed)
Chief Complaint  Patient presents with  . Follow-up    Pt reports having incr phlegm and cough x 1-2 months. Pt states she feels this is d/t change in weather.     History of Present Illness: Kaitlyn [name] is a 48 y.o. female former smoker with sarcoidosis, and COPD.  She has been getting a cough with sputum.  Her phlegm seems thicker, and has a bad taste.  She is not sure about the color of the phlegm.  She denies fever, or sinus congestion.  She is only needing to use her albuterol inhaler every few days, and not needing her nebulizer at all.  She denies chest pain, fever, or skin rashes.   Tests: CT chest 01/23/12>>bulky mediastinal/hilar LAN.  Scattered ill-defined upper lobe predominate perilymphatic and centrilobular nodules. PFT 03/13/12>>FEV1 1.65 (57%), FEV1% 58, TLC 4.44 (79%), DLCO 62%, no BD EBUS with Tbx 03/01/12>>Streptococcus pneumoniae in BAL, cytology/Tbx negative CT chest 04/30/12>>No change in the appearance of the mediastinal and hilar adenopathy and pulmonary nodularity 04/30/12 start prednisone CT chest 07/31/12>>b/l hilar adenopathy no change, b/l upper lobe subpleural nodularity, improved LUL GGO PFT 03/18/13 >> FEV1 1.78 (69%), FEV1% 67, TLC 4.04 (75%), DLCO 92%, no BD CT chest 04/14/13 >> peribronchovascular nodularity b/l upper lobes Rt > Lt mildly progressed, no change to mediastinal/hilar adenopathy Labs 04/29/13 >> Hep B S Ag negative, Hep B S Ab reactive, Hep B core Ab negative, Hep B core IgM negative, HCV Ab negative 04/30/13 Start MTX 11/30/13 Off prednisone CT chest 01/05/14 >> upper lobe predominant prebronchovascular reticulo-nodule opacities, 1.2 cm Rt paratracheal LAN, 1.6 cm subcarinal LAN, 1.7 cm Rt hilar LAN  PFT 01/13/14 >> FEV1 1.64 (70%), FEV1% 70, TLC 3.69 (68%), DLCO 79%  She  has a past medical history of Hyperlipidemia; Obesity; Anxiety; Hidradenitis; Solitary pulmonary nodule; Osteoarthritis; Varicose vein; PONV (postoperative nausea and  vomiting); Hypertension; Asthma; Shortness of breath; Carpal tunnel syndrome; Depression; 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); 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); Calcium deposits in tendon and bursa; Sarcoidosis; Contracture of joint of other specified site; Cramp of limb; Other acute otitis externa; Cellulitis and abscess of finger, unspecified; Unspecified nasal polyp; Pain in joint, ankle and foot; Carpal tunnel syndrome; Pain in joint, hand; Obesity, unspecified; Keloid scar; Tobacco use disorder; Other and unspecified special symptom or syndrome, not elsewhere classified; Extrinsic asthma, unspecified; Unspecified constipation; Osteoarthrosis, unspecified whether generalized or localized, unspecified site; Pain in joint, pelvic region and thigh; and Pain in joint, lower leg.  She  has past surgical history that includes Nasal sinus surgery (2011); Other surgical history (2010); Multiple tooth extractions (2010); Bronchoscopy (03/01/12); Rhinoplasty (08/2009); and REMOVAL OF GROWTH FROM FINGER.   Prior to Admission medications   Medication Sig Start Date End Date Taking? Authorizing Provider  acetaminophen (TYLENOL) 500 MG tablet Take 500 mg by mouth every 6 (six) hours as needed. For pain   Yes Historical Provider, MD  albuterol (PROVENTIL HFA;VENTOLIN HFA) 108 (90 BASE) MCG/ACT inhaler Inhale 2 puffs into the lungs 2 (two) times daily as needed. For shortness of breath   Yes Historical Provider, MD  buPROPion (WELLBUTRIN SR) 150 MG 12 hr tablet Take 150 mg by mouth 2 (two) times daily.   Yes Historical Provider, MD  calcium carbonate (TUMS - DOSED IN MG ELEMENTAL CALCIUM) 500 MG chewable tablet Chew 1 tablet by mouth daily as needed. For indigestion   Yes Historical Provider, MD  cephALEXin (KEFLEX) 500 MG capsule Take 500 mg by mouth 2 (two) times daily.  Yes Historical Provider, MD  clindamycin (CLEOCIN T) 1 % lotion Apply 1 application topically 2 (two) times daily.    Yes Historical  Provider, MD  Eflornithine HCl (VANIQA) 13.9 % cream Apply topically 2 (two) times daily as needed.    Yes Historical Provider, MD  Fluticasone-Salmeterol (ADVAIR) 250-50 MCG/DOSE AEPB Inhale 1 puff into the lungs every 12 (twelve) hours.   Yes Historical Provider, MD  ibuprofen (ADVIL,MOTRIN) 800 MG tablet Take 800 mg by mouth 4 (four) times daily as needed. For pain   Yes Historical Provider, MD  ipratropium-albuterol (DUONEB) 0.5-2.5 (3) MG/3ML SOLN Take 3 mLs by nebulization 2 (two) times daily as needed. For shortness of breath 09/09/12  Yes Coralyn HellingVineet Adalea Handler, MD  losartan-hydrochlorothiazide (HYZAAR) 100-12.5 MG per tablet Take 1 tablet by mouth daily.   Yes Historical Provider, MD  magnesium citrate 1.745 GM/30ML SOLN Take 1 Bottle by mouth daily as needed. For constipation   Yes Historical Provider, MD  montelukast (SINGULAIR) 10 MG tablet Take 10 mg by mouth at bedtime.    Yes Historical Provider, MD  phentermine 30 MG capsule Take 30 mg by mouth daily.   Yes Historical Provider, MD  pravastatin (PRAVACHOL) 40 MG tablet Take 40 mg by mouth daily.   Yes Historical Provider, MD  predniSONE (DELTASONE) 5 MG tablet Take 10 mg by mouth daily. Use as directed 11/14/12  Yes Coralyn HellingVineet Dayonna Selbe, MD  tiotropium (SPIRIVA) 18 MCG inhalation capsule Place 18 mcg into inhaler and inhale daily.   Yes Historical Provider, MD  traMADol (ULTRAM) 50 MG tablet Take 50 mg by mouth 4 (four) times daily as needed. For pain   Yes Historical Provider, MD    Allergies  Allergen Reactions  . Codeine Other (See Comments)    Reaction unknown  . Cyclinex [Tetracycline Hcl] Other (See Comments)    Reaction unknown, all "cyclines"  . Morphine And Related Other (See Comments)    Reaction unknown    Physical Exam:  General - Obese  HEENT - No sinus tenderness, MP 3, no oral exudate, no LAN Cardiac - s1s2 regular, no murmur  Chest - no wheeze/rales, no dullness Abdomen - soft, + bowel sounds Extremities - no  edema Neurologic - normal strength Skin - no rashes  Psychiatric - normal mood, behavior   Assessment/Plan:  Coralyn HellingVineet Hogan Hoobler, MD [place] [facility]monary/Critical Care Pager:  [phone]

## 2014-04-30 NOTE — Assessment & Plan Note (Signed)
She is to continue 2.5 mg MTX >> 7 pills weekly with folic acid, and bactrin for PCP prophylaxis.  She will need f/u labs at next visit.  Will need to determine when to start decrease her MTX dose at next visit >> likely after repeating CT chest.

## 2014-04-30 NOTE — Assessment & Plan Note (Signed)
Current symptoms likely related to COPD and weather change.  Still she is much improved compared to when I first started seeing her.  She is to continue dulera, singulair, and spiriva.

## 2014-05-04 ENCOUNTER — Other Ambulatory Visit: Payer: Self-pay | Admitting: Pulmonary Disease

## 2014-05-23 ENCOUNTER — Other Ambulatory Visit: Payer: Self-pay | Admitting: Internal Medicine

## 2014-05-27 ENCOUNTER — Other Ambulatory Visit: Payer: Self-pay | Admitting: Pulmonary Disease

## 2014-05-30 LAB — PULMONARY FUNCTION TEST
DL/VA % pred: 102 %
DL/VA: 5.17 ml/min/mmHg/L
DLCO unc % pred: 79 %
DLCO unc: 21.42 ml/min/mmHg
FEF 25-75 PRE: 0.88 L/s
FEF 25-75 Post: 0.96 L/sec
FEF2575-%Change-Post: 10 %
FEF2575-%PRED-PRE: 33 %
FEF2575-%Pred-Post: 36 %
FEV1-%CHANGE-POST: 3 %
FEV1-%Pred-Post: 64 %
FEV1-%Pred-Pre: 62 %
FEV1-PRE: 1.59 L
FEV1-Post: 1.64 L
FEV1FVC-%Change-Post: 7 %
FEV1FVC-%PRED-PRE: 79 %
FEV6-%Change-Post: -4 %
FEV6-%PRED-POST: 75 %
FEV6-%Pred-Pre: 79 %
FEV6-POST: 2.33 L
FEV6-Pre: 2.43 L
FEV6FVC-%Change-Post: 0 %
FEV6FVC-%PRED-PRE: 103 %
FEV6FVC-%Pred-Post: 102 %
FVC-%Change-Post: -3 %
FVC-%PRED-POST: 74 %
FVC-%Pred-Pre: 77 %
FVC-POST: 2.34 L
FVC-Pre: 2.43 L
Post FEV1/FVC ratio: 70 %
Post FEV6/FVC ratio: 100 %
Pre FEV1/FVC ratio: 65 %
Pre FEV6/FVC Ratio: 100 %

## 2014-06-12 IMAGING — CR DG CHEST 2V
2 series · 2 of 2 positions shown · non-contrast
Comparison: 03/01/2012 and 07/31/2012

CLINICAL DATA: COPD, history of sarcoid

CHEST - 2 VIEW

[view not recorded (1 of 2)]
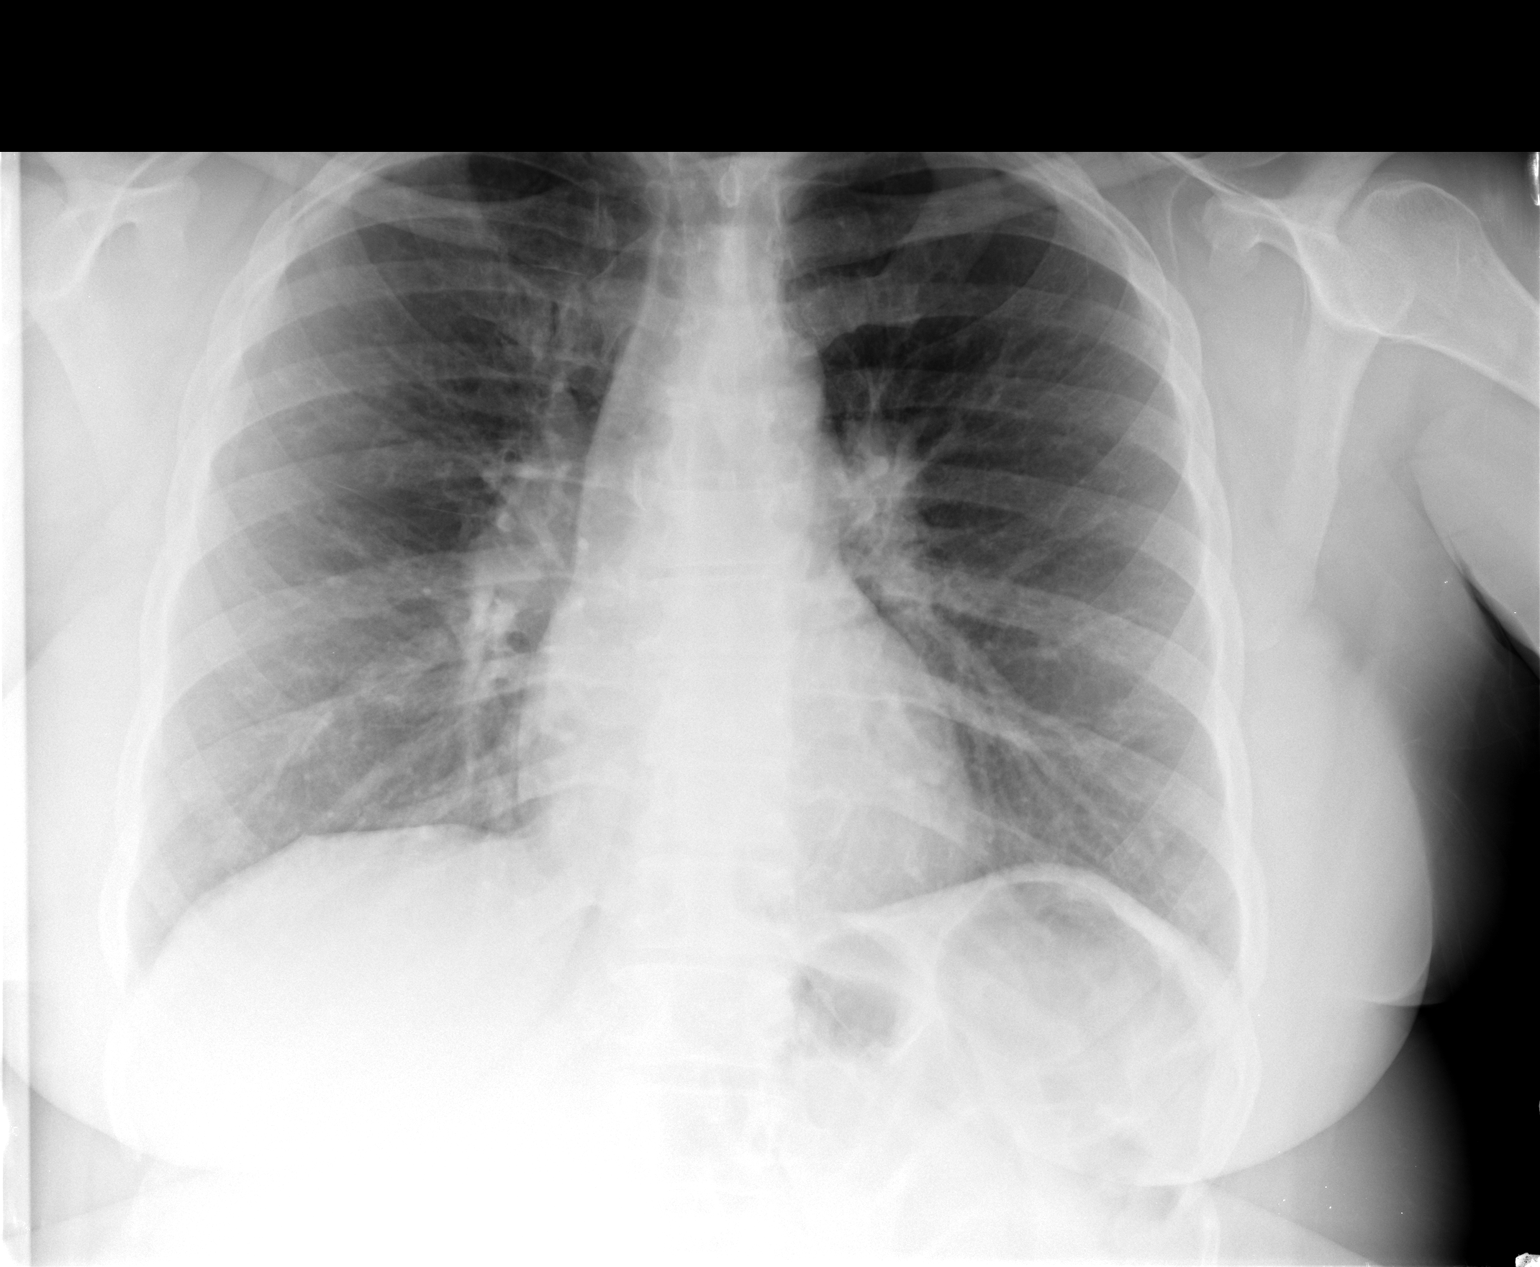

[view not recorded (2 of 2)]
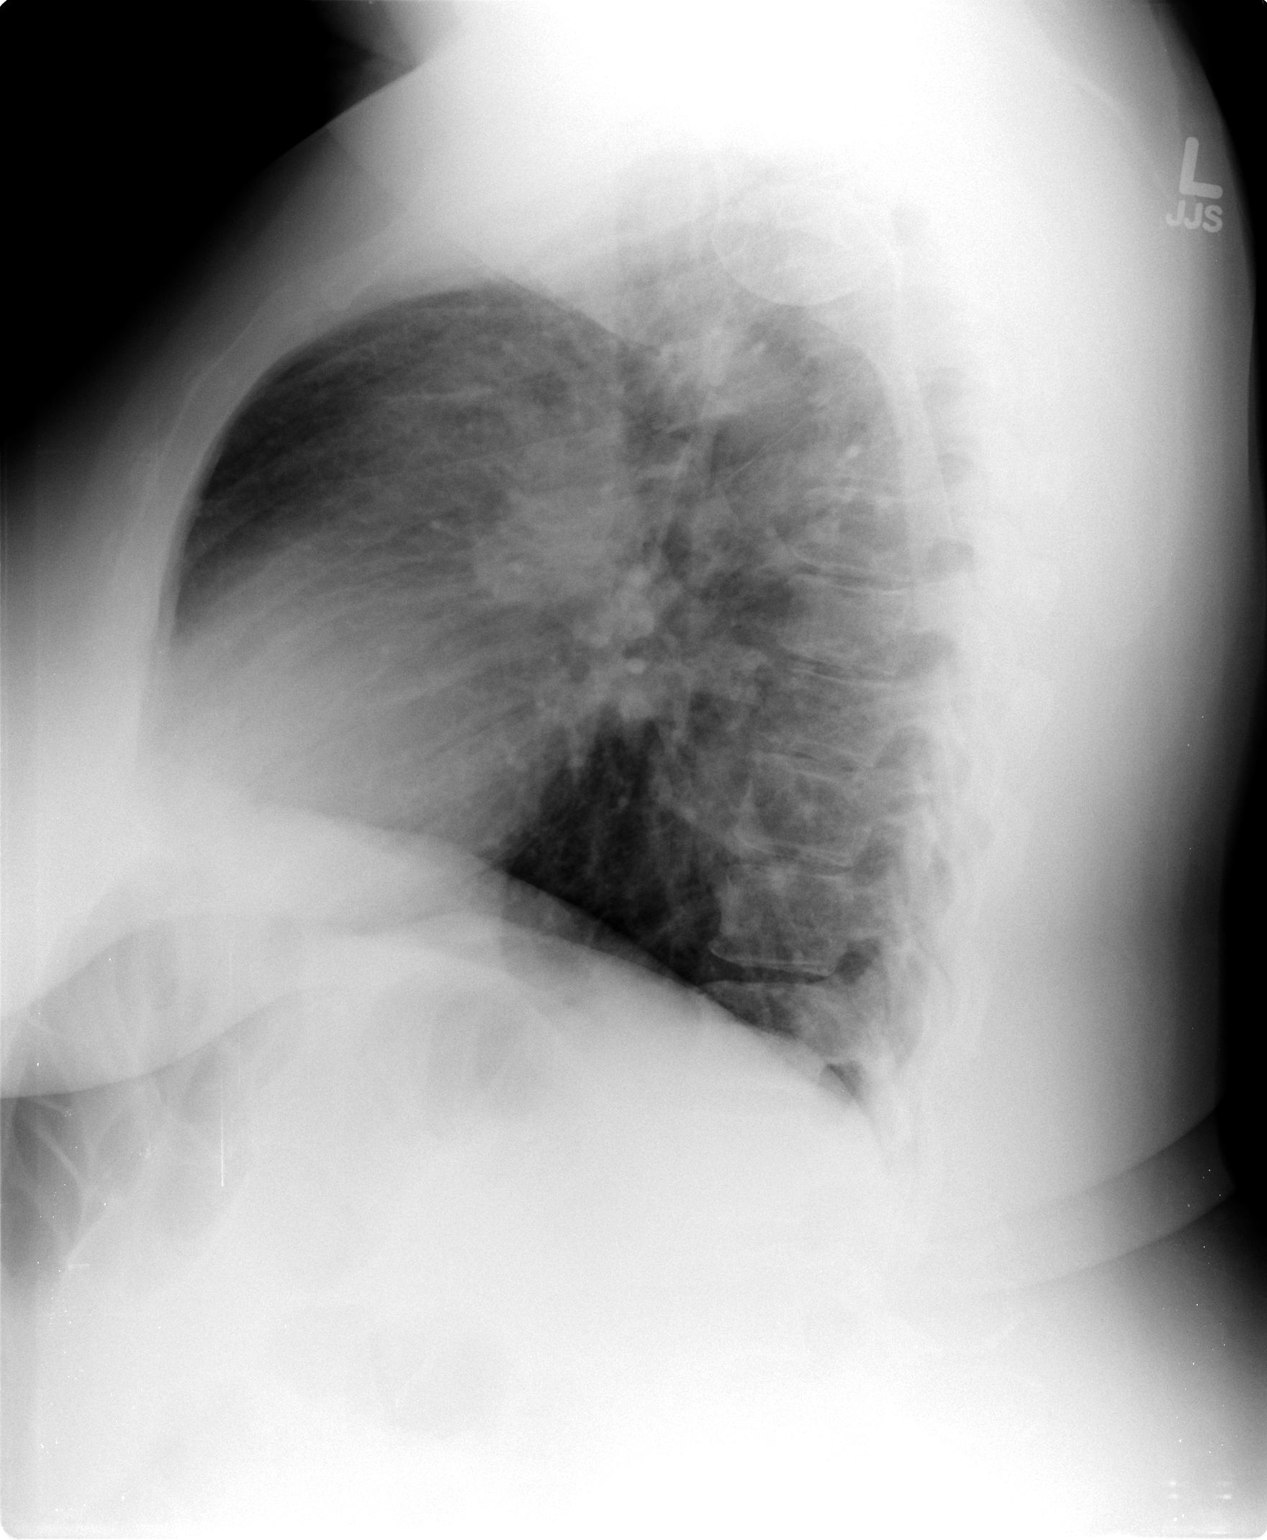

[2 of 2 positions shown; findings below may reference images not displayed]

FINDINGS: Cardiomediastinal silhouette is unremarkable.  No acute
infiltrate or pleural effusion.  No pulmonary edema.  Bony thorax
is unremarkable.
IMPRESSION: No active disease.

## 2014-06-29 ENCOUNTER — Other Ambulatory Visit: Payer: Self-pay | Admitting: Pulmonary Disease

## 2014-07-21 ENCOUNTER — Other Ambulatory Visit: Payer: Self-pay | Admitting: Internal Medicine

## 2014-07-22 ENCOUNTER — Telehealth: Payer: Self-pay | Admitting: Pulmonary Disease

## 2014-07-22 MED ORDER — TIOTROPIUM BROMIDE MONOHYDRATE 18 MCG IN CAPS: ORAL_CAPSULE | RESPIRATORY_TRACT | Status: DC

## 2014-07-22 MED ORDER — ALBUTEROL SULFATE HFA 108 (90 BASE) MCG/ACT IN AERS: 2.0000 | INHALATION_SPRAY | RESPIRATORY_TRACT | Status: DC | PRN

## 2014-07-22 MED ORDER — MOMETASONE FURO-FORMOTEROL FUM 100-5 MCG/ACT IN AERO: INHALATION_SPRAY | RESPIRATORY_TRACT | Status: DC

## 2014-07-22 MED ORDER — IPRATROPIUM-ALBUTEROL 0.5-2.5 (3) MG/3ML IN SOLN: 3.0000 mL | Freq: Two times a day (BID) | RESPIRATORY_TRACT | Status: DC | PRN

## 2014-07-22 NOTE — Telephone Encounter (Signed)
Called spoke w/ pt. Aware will send in refills for proair, dulera, duoneb, spiriva. Nothing further needed

## 2014-07-28 ENCOUNTER — Other Ambulatory Visit: Payer: Self-pay | Admitting: Pulmonary Disease

## 2014-08-12 ENCOUNTER — Emergency Department (INDEPENDENT_AMBULATORY_CARE_PROVIDER_SITE_OTHER): Payer: PRIVATE HEALTH INSURANCE

## 2014-08-12 ENCOUNTER — Encounter (HOSPITAL_COMMUNITY): Payer: Self-pay | Admitting: Emergency Medicine

## 2014-08-12 ENCOUNTER — Emergency Department (HOSPITAL_COMMUNITY)
Admission: EM | Admit: 2014-08-12 | Discharge: 2014-08-12 | Disposition: A | Discharge status: Home / Self Care | Payer: PRIVATE HEALTH INSURANCE | Source: Home / Self Care | Attending: Emergency Medicine | Admitting: Emergency Medicine

## 2014-08-12 DIAGNOSIS — S90122A Contusion of left lesser toe(s) without damage to nail, initial encounter: Secondary | ICD-10-CM

## 2014-08-12 MED ORDER — IBUPROFEN 800 MG PO TABS
800.0000 mg | ORAL_TABLET | Freq: Once | ORAL | Status: AC
  Administered 2014-08-12: 800 mg via ORAL

## 2014-08-12 MED ORDER — TRAMADOL HCL 50 MG PO TABS: 100.0000 mg | ORAL_TABLET | Freq: Three times a day (TID) | ORAL | Status: DC | PRN

## 2014-08-12 MED ORDER — IBUPROFEN 800 MG PO TABS
ORAL_TABLET | ORAL | Status: AC
  Filled 2014-08-12: qty 1

## 2014-08-12 NOTE — ED Notes (Signed)
Pt  REPORTS  SHE  JAMMED THE L  SMALL  TOE ON   DOOR  YEST   SHE  HAS  PAIN  AND  SWELLING OF THE  L  SMALL   TOE  WITH  PAIN ON  WEIGHT  BEARING           AND  SWELLING   NOTED

## 2014-08-12 NOTE — Discharge Instructions (Signed)
Crush Injury, Fingers or Toes °A crush injury to the fingers or toes means the tissues have been damaged by being squeezed (compressed). There will be bleeding into the tissues and swelling. Often, blood will collect under the skin. When this happens, the skin on the finger often dies and may slough off (shed) 1 week to 10 days later. Usually, new skin is growing underneath. If the injury has been too severe and the tissue does not survive, the damaged tissue may begin to turn black over several days.  °Wounds which occur because of the crushing may be stitched (sutured) shut. However, crush injuries are more likely to become infected than other injuries. These wounds may not be closed as tightly as other types of cuts to prevent infection. Nails involved are often lost. These usually grow back over several weeks.  °DIAGNOSIS °X-rays may be taken to see if there is any injury to the bones. °TREATMENT °Broken bones (fractures) may be treated with splinting, depending on the fracture. Often, no treatment is required for fractures of the last bone in the fingers or toes. °HOME CARE INSTRUCTIONS  °· The crushed part should be raised (elevated) above the heart or center of the chest as much as possible for the first several days or as directed. This helps with pain and lessens swelling. Less swelling increases the chances that the crushed part will survive. °· Put ice on the injured area. °¨ Put ice in a plastic bag. °¨ Place a towel between your skin and the bag. °¨ Leave the ice on for 15-20 minutes, 03-04 times a day for the first 2 days. °· Only take over-the-counter or prescription medicines for pain, discomfort, or fever as directed by your caregiver. °· Use your injured part only as directed. °· Change your bandages (dressings) as directed. °· Keep all follow-up appointments as directed by your caregiver. Not keeping your appointment could result in a chronic or permanent injury, pain, and disability. If there is  any problem keeping the appointment, you must call to reschedule. °SEEK IMMEDIATE MEDICAL CARE IF:  °· There is redness, swelling, or increasing pain in the wound area. °· Pus is coming from the wound. °· You have a fever. °· You notice a bad smell coming from the wound or dressing. °· The edges of the wound do not stay together after the sutures have been removed. °· You are unable to move the injured finger or toe. °MAKE SURE YOU:  °· Understand these instructions. °· Will watch your condition. °· Will get help right away if you are not doing well or get worse. °Document Released: 10/16/2005 Document Revised: 01/08/2012 Document Reviewed: 03/03/2011 °[employer]® Patient Information ©2015 [employer]. This information is not intended to replace advice given to you by your health care provider. Make sure you discuss any questions you have with your health care provider. ° °

## 2014-08-12 NOTE — ED Provider Notes (Signed)
Chief Complaint   Foot Injury   History of Present Illness   Kaitlyn [name] is a 48 year old female who stubbed her left little toe on furniture yesterday. It's been swollen and painful since then. It hurts to touch, to move the toe, to walk, and to wear tightfitting shoes.  Review of Systems   Other than as noted above, the patient denies any of the following symptoms: Systemic:  No fevers or chills. Musculoskeletal:  No joint pain or arthritis.  Neurological:  No muscular weakness, paresthesias.   PMFSH   Past medical history, family history, social history, meds, and allergies were reviewed.   The patient is allergic to tetracycline and codeine. Current meds include albuterol, Wellbutrin, folic acid, DuoNeb, losartan/hydrochlorothiazide, the entire main, pravastatin, Spiriva, and tramadol. She has a history of sarcoidosis, COPD, hypertension, hypercholesterolemia, and depression  Physical  Examination     Vital signs:  BP 136/85  Pulse 101  Temp(Src) 98 F (36.7 C) (Oral)  Resp 20  SpO2 98%  LMP 07/30/2014 Gen:  Alert and oriented times 3.  In no distress. Musculoskeletal:  Exam of the foot reveals there is pain to palpation over the little toe with swelling. No pain over any other digits, over the dorsum of the foot, the heel, or the ankle.  Otherwise, all joints had a full a ROM with no swelling, bruising or deformity.  No edema, pulses full. Extremities were warm and pink.  Capillary refill was brisk.  Skin:  [name], warm and dry.  No rash. Neuro:  Alert and oriented times 3.  Muscle strength was normal.  Sensation was intact to light touch.   Radiology   Dg Foot Complete Left  08/12/2014   CLINICAL DATA:  Injury to left fifth toe yesterday. The patient hit the toe into the door. The toe was pulled laterally.  EXAM: LEFT FOOT - COMPLETE 3+ VIEW  COMPARISON:  Left total radiographs 05/16/2012.  FINDINGS: Soft tissue swelling about the fifth digit is similar to the prior  study. There is fusion across the DIP joint. No acute fracture is evident. The remainder of the foot is unremarkable.  IMPRESSION: 1. Soft tissue swelling about the fifth digit without an acute condyle fracture. 2. Chronic fusion across the DIP joint.   Electronically Signed   By: Gennette Pachris  Mattern M.D.   On: 08/12/2014 10:55   I reviewed the images independently and personally and concur with the radiologist's findings.   Course in Urgent [facility]   The following medications were given:  Medications  ibuprofen (ADVIL,MOTRIN) tablet 800 mg (800 mg Oral Given 08/12/14 1059)    The toes buddy taped and she was put in a hard soled shoe.  Assessment   The encounter diagnosis was Contusion, toe, left, initial encounter.  Plan    1.  Meds:  The following meds were prescribed:   Discharge Medication List as of 08/12/2014 11:38 AM    START taking these medications   Details  !! traMADol (ULTRAM) 50 MG tablet Take 2 tablets (100 mg total) by mouth every 8 (eight) hours as needed., Starting 08/12/2014, Until Discontinued, Print     !! - Potential duplicate medications found. Please discuss with provider.      2.  Patient Education/Counseling:  The patient was given appropriate handouts, self care instructions, and instructed in symptomatic relief including rest and activity, elevation, application of ice and compression.    3.  Follow up:  The patient was told to follow up  here if no better in 3 to 4 days, or sooner if becoming worse in any way, and given some red flag symptoms such as worsening pain or neurological symptoms which would prompt immediate return.      Reuben Likesavid C Jonice Cerra, MD 08/12/14 [phone]

## 2014-08-12 NOTE — ED Notes (Signed)
Applied   By  [employer]  sneed    emt

## 2014-09-03 ENCOUNTER — Encounter: Payer: Self-pay | Admitting: Internal Medicine

## 2014-09-03 ENCOUNTER — Ambulatory Visit: Payer: PRIVATE HEALTH INSURANCE | Admitting: Internal Medicine

## 2014-09-14 ENCOUNTER — Ambulatory Visit: Payer: PRIVATE HEALTH INSURANCE | Admitting: Pulmonary Disease

## 2014-09-21 ENCOUNTER — Other Ambulatory Visit (INDEPENDENT_AMBULATORY_CARE_PROVIDER_SITE_OTHER): Payer: PRIVATE HEALTH INSURANCE

## 2014-09-21 ENCOUNTER — Encounter: Payer: Self-pay | Admitting: Pulmonary Disease

## 2014-09-21 ENCOUNTER — Ambulatory Visit (INDEPENDENT_AMBULATORY_CARE_PROVIDER_SITE_OTHER): Payer: PRIVATE HEALTH INSURANCE | Admitting: Pulmonary Disease

## 2014-09-21 VITALS — BP 128/70 | HR 81 | Ht 66.2 in | Wt 288.0 lb

## 2014-09-21 DIAGNOSIS — D860 Sarcoidosis of lung: Secondary | ICD-10-CM

## 2014-09-21 DIAGNOSIS — Z23 Encounter for immunization: Secondary | ICD-10-CM

## 2014-09-21 DIAGNOSIS — J449 Chronic obstructive pulmonary disease, unspecified: Secondary | ICD-10-CM

## 2014-09-21 LAB — COMPREHENSIVE METABOLIC PANEL
ALBUMIN: 3.3 g/dL — AB (ref 3.5–5.2)
ALT: 18 U/L (ref 0–35)
AST: 26 U/L (ref 0–37)
Alkaline Phosphatase: 113 U/L (ref 39–117)
BUN: 9 mg/dL (ref 6–23)
CALCIUM: 9.2 mg/dL (ref 8.4–10.5)
CHLORIDE: 104 meq/L (ref 96–112)
CO2: 29 mEq/L (ref 19–32)
Creatinine, Ser: 1.1 mg/dL (ref 0.4–1.2)
GFR: 69.2 mL/min (ref >60.00)
GLUCOSE: 70 mg/dL (ref 70–99)
Potassium: 3.7 mEq/L (ref 3.5–5.1)
Sodium: 140 mEq/L (ref 135–145)
Total Bilirubin: 0.3 mg/dL (ref 0.2–1.2)
Total Protein: 7.2 g/dL (ref 6.0–8.3)

## 2014-09-21 LAB — CBC WITH DIFFERENTIAL/PLATELET
BASOS PCT: 0.2 % (ref 0.0–3.0)
Basophils Absolute: 0 10*3/uL (ref 0.0–0.1)
EOS PCT: 3.1 % (ref 0.0–5.0)
Eosinophils Absolute: 0.2 10*3/uL (ref 0.0–0.7)
HCT: 36.9 % (ref 36.0–46.0)
HEMOGLOBIN: 12.1 g/dL (ref 12.0–15.0)
LYMPHS PCT: 11.9 % — AB (ref 12.0–46.0)
Lymphs Abs: 0.8 10*3/uL (ref 0.7–4.0)
MCHC: 32.7 g/dL (ref 30.0–36.0)
MCV: 94.9 fl (ref 78.0–100.0)
Monocytes Absolute: 0.5 10*3/uL (ref 0.1–1.0)
Monocytes Relative: 8.3 % (ref 3.0–12.0)
Neutro Abs: 5 10*3/uL (ref 1.4–7.7)
Neutrophils Relative %: 76.5 % (ref 43.0–77.0)
Platelets: 242 10*3/uL (ref 150.0–400.0)
RBC: 3.89 Mil/uL (ref 3.87–5.11)
RDW: 16.1 % — ABNORMAL HIGH (ref 11.5–15.5)
WBC: 6.5 10*3/uL (ref 4.0–10.5)

## 2014-09-21 MED ORDER — TIOTROPIUM BROMIDE MONOHYDRATE 18 MCG IN CAPS: 18.0000 ug | ORAL_CAPSULE | Freq: Every day | RESPIRATORY_TRACT | Status: DC | PRN

## 2014-09-21 NOTE — Addendum Note (Signed)
Addended by: Maisie FusGREEN, ASHTYN M on: 09/21/2014 05:07 PM   Modules accepted: Orders

## 2014-09-21 NOTE — Patient Instructions (Signed)
Flu shot today Lab tests today Will schedule CT chest with IV contrast Follow up in 3 months

## 2014-09-21 NOTE — Progress Notes (Signed)
Chief Complaint  Patient presents with  . Follow-up    Pt states that she has not been consistently using inhalers. Uses PRN. Pt notes SOB with exertion.     History of Present Illness: Kaitlyn [name] is a 48 y.o. female former smoker with sarcoidosis, and COPD.  Her breathing has improved since last visit.  She is not using her inhalers on a consistent basis >> because she does not feel like she needs them all the time.  Her main breath difficulty is when she does to much activity or walks up stairs/hills.    She is not having cough, wheeze, sputum, hemoptysis, sinus congestion, sore throat, chest pain, fever, of leg swelling.  She thinks she had a reaction to mild thistle >> got hives after using this.   Tests/Events: EBUS with Tbx 03/01/12>>Streptococcus pneumoniae in BAL, cytology/Tbx negative 04/30/12 start prednisone Labs 04/29/13 >> Hep B S Ag negative, Hep B S Ab reactive, Hep B core Ab negative, Hep B core IgM negative, HCV Ab negative 04/30/13 Start MTX 11/30/13 Off prednisone   CT CHEST: CT chest 01/23/12>>bulky mediastinal/hilar LAN.  Scattered ill-defined upper lobe predominate perilymphatic and centrilobular nodules. CT chest 04/30/12>>No change in the appearance of the mediastinal and hilar adenopathy and pulmonary nodularity CT chest 07/31/12>>b/l hilar adenopathy no change, b/l upper lobe subpleural nodularity, improved LUL GGO CT chest 04/14/13 >> peribronchovascular nodularity b/l upper lobes Rt > Lt mildly progressed, no change to mediastinal/hilar adenopathy CT chest 01/05/14 >> upper lobe predominant prebronchovascular reticulo-nodule opacities, 1.2 cm Rt paratracheal LAN, 1.6 cm subcarinal LAN, 1.7 cm Rt hilar LAN    PULMONARY FUNCTION TESTS: PFT 03/13/12>>FEV1 1.65 (57%), FEV1% 58, TLC 4.44 (79%), DLCO 62%, no BD PFT 03/18/13 >> FEV1 1.78 (69%), FEV1% 67, TLC 4.04 (75%), DLCO 92%, no BD PFT 01/13/14 >> FEV1 1.64 (70%), FEV1% 70, TLC 3.69 (68%), DLCO 79%  PMHx >>  HLD, Anxiety, Depression, Hidradenitis  PSHx, Medications, Allergies, Fhx, Shx reviewed.  Physical Exam: Blood pressure 128/70, pulse 81, height 5' 6.2" (1.681 m), weight 288 lb (130.636 kg), SpO2 99 %.  General - Obese  HEENT - No sinus tenderness, MP 3, no oral exudate, no LAN Cardiac - s1s2 regular, no murmur  Chest - no wheeze/rales, no dullness Abdomen - soft, + bowel sounds Extremities - no edema Neurologic - normal strength Skin - no rashes  Psychiatric - normal mood, behavior   Impression:  Pulmonary sarcoidosis. Plan: - repeat labs, CT chest with IV contrast - will discuss plans for MTX after review of above tests - contine bactrim for PCP prophylaxis while on MTX  COPD with chronic bronchitis. Plan: - continue singulair, dulera, and prn albuterol/duoneb - will see how she does off spiriva - flu shot today  Dyspnea on exertion >> likely from deconditioning in setting of COPD, sarcoidosis. Plan: - encouraged her to maintain a regular exercise regimen to maintain her stamina  Coralyn HellingVineet Laresa Oshiro, MD [facility] Pager:  [phone]

## 2014-09-22 ENCOUNTER — Telehealth: Payer: Self-pay | Admitting: Pulmonary Disease

## 2014-09-22 NOTE — Telephone Encounter (Signed)
CBC    Component Value Date/Time   WBC 6.5 09/21/2014 1037   RBC 3.89 09/21/2014 1037   HGB 12.1 09/21/2014 1037   HCT 36.9 09/21/2014 1037   PLT 242.0 09/21/2014 1037   MCV 94.9 09/21/2014 1037   MCHC 32.7 09/21/2014 1037   RDW 16.1* 09/21/2014 1037   LYMPHSABS 0.8 09/21/2014 1037   MONOABS 0.5 09/21/2014 1037   EOSABS 0.2 09/21/2014 1037   BASOSABS 0.0 09/21/2014 1037    CMP     Component Value Date/Time   NA 140 09/21/2014 1037   K 3.7 09/21/2014 1037   CL 104 09/21/2014 1037   CO2 29 09/21/2014 1037   GLUCOSE 70 09/21/2014 1037   BUN 9 09/21/2014 1037   CREATININE 1.1 09/21/2014 1037   CALCIUM 9.2 09/21/2014 1037   PROT 7.2 09/21/2014 1037   ALBUMIN 3.3* 09/21/2014 1037   AST 26 09/21/2014 1037   ALT 18 09/21/2014 1037   ALKPHOS 113 09/21/2014 1037   BILITOT 0.3 09/21/2014 1037     Will have my nurse inform pt that lab tests were normal.  Will call her back once CT chest reviewed, and then discuss plans for adjusting dose of methotrexate.

## 2014-09-23 NOTE — Telephone Encounter (Signed)
Lmtcb. 11/25 

## 2014-09-23 NOTE — Telephone Encounter (Signed)
Results have been explained to patient, pt expressed understanding. Nothing further needed.  

## 2014-09-23 NOTE — Telephone Encounter (Signed)
LMTCB x 1 

## 2014-09-30 ENCOUNTER — Telehealth: Payer: Self-pay | Admitting: Pulmonary Disease

## 2014-09-30 MED ORDER — MOMETASONE FURO-FORMOTEROL FUM 100-5 MCG/ACT IN AERO: INHALATION_SPRAY | RESPIRATORY_TRACT | Status: DC

## 2014-09-30 MED ORDER — METHOTREXATE 2.5 MG PO TABS: ORAL_TABLET | ORAL | Status: DC

## 2014-09-30 MED ORDER — FOLIC ACID 1 MG PO TABS: ORAL_TABLET | ORAL | Status: DC

## 2014-09-30 MED ORDER — SULFAMETHOXAZOLE-TRIMETHOPRIM 400-80 MG PO TABS: ORAL_TABLET | ORAL | Status: DC

## 2014-09-30 MED ORDER — ALBUTEROL SULFATE HFA 108 (90 BASE) MCG/ACT IN AERS: 2.0000 | INHALATION_SPRAY | RESPIRATORY_TRACT | Status: DC | PRN

## 2014-09-30 MED ORDER — IPRATROPIUM-ALBUTEROL 0.5-2.5 (3) MG/3ML IN SOLN: 3.0000 mL | Freq: Two times a day (BID) | RESPIRATORY_TRACT | Status: DC | PRN

## 2014-09-30 NOTE — Telephone Encounter (Signed)
Spoke with pt, meds sent to [facility] on [address].  Pharmacy changed in pt's chart.  Nothing further needed.

## 2014-10-07 ENCOUNTER — Ambulatory Visit (INDEPENDENT_AMBULATORY_CARE_PROVIDER_SITE_OTHER)
Admission: RE | Admit: 2014-10-07 | Discharge: 2014-10-07 | Disposition: A | Discharge status: Home / Self Care | Payer: PRIVATE HEALTH INSURANCE | Source: Ambulatory Visit | Attending: Pulmonary Disease | Admitting: Pulmonary Disease

## 2014-10-07 DIAGNOSIS — D860 Sarcoidosis of lung: Secondary | ICD-10-CM

## 2014-10-07 MED ORDER — IOHEXOL 300 MG/ML  SOLN
80.0000 mL | Freq: Once | INTRAMUSCULAR | Status: AC | PRN
  Administered 2014-10-07: 80 mL via INTRAVENOUS

## 2014-10-08 ENCOUNTER — Telehealth: Payer: Self-pay | Admitting: Pulmonary Disease

## 2014-10-08 NOTE — Telephone Encounter (Signed)
CT chest 10/07/14 >> decreased LAN, slight improvement in peribronchovascular areas of GGO and septal thickening  Attempted to call pt to discuss results.  Will have my nurse inform pt that CT chest looks better.  Her lymph node swelling has decreased and other areas of inflammation in lungs has decreased some also.  She has chronic scarring changes from sarcoidosis, but again overall appearance is better.  She can change her methotrexate dose to 2.5 mg pills, and take 6 pills weekly for one month.  She can then change to 2.5 mg pills, and take 5 pills weekly >> she should then stay on this dose until her next ROV.

## 2014-10-09 NOTE — Telephone Encounter (Signed)
Results have been explained to patient, pt expressed understanding. Pt wanted to let Dr Craige CottaSood know that she had to restart Spiriva, did not do well without spiriva. Would like to know if this is okay to continue taking again. Please advise Dr Craige Cottasood. Thanks.

## 2014-10-12 NOTE — Telephone Encounter (Signed)
LMTCB x 1 

## 2014-10-12 NOTE — Telephone Encounter (Signed)
Please inform pt that she can continue using spiriva.

## 2014-10-16 NOTE — Telephone Encounter (Signed)
Pt aware. Nothing further needed 

## 2014-11-10 ENCOUNTER — Other Ambulatory Visit: Payer: Self-pay | Admitting: *Deleted

## 2014-11-10 ENCOUNTER — Telehealth: Payer: Self-pay | Admitting: *Deleted

## 2014-11-10 DIAGNOSIS — E785 Hyperlipidemia, unspecified: Secondary | ICD-10-CM

## 2014-11-10 DIAGNOSIS — F329 Major depressive disorder, single episode, unspecified: Secondary | ICD-10-CM

## 2014-11-10 DIAGNOSIS — I10 Essential (primary) hypertension: Secondary | ICD-10-CM

## 2014-11-10 DIAGNOSIS — F32A Depression, unspecified: Secondary | ICD-10-CM

## 2014-11-10 MED ORDER — PRAVASTATIN SODIUM 40 MG PO TABS: 40.0000 mg | ORAL_TABLET | Freq: Every day | ORAL | Status: DC

## 2014-11-10 MED ORDER — TIOTROPIUM BROMIDE MONOHYDRATE 18 MCG IN CAPS: 18.0000 ug | ORAL_CAPSULE | Freq: Every day | RESPIRATORY_TRACT | Status: DC | PRN

## 2014-11-10 MED ORDER — LOSARTAN POTASSIUM-HCTZ 100-12.5 MG PO TABS: 1.0000 | ORAL_TABLET | Freq: Every day | ORAL | Status: DC

## 2014-11-10 MED ORDER — ALBUTEROL SULFATE HFA 108 (90 BASE) MCG/ACT IN AERS: 2.0000 | INHALATION_SPRAY | RESPIRATORY_TRACT | Status: DC | PRN

## 2014-11-10 MED ORDER — MOMETASONE FURO-FORMOTEROL FUM 100-5 MCG/ACT IN AERO: INHALATION_SPRAY | RESPIRATORY_TRACT | Status: DC

## 2014-11-10 MED ORDER — MONTELUKAST SODIUM 10 MG PO TABS: ORAL_TABLET | ORAL | Status: DC

## 2014-11-10 MED ORDER — BUPROPION HCL ER (SR) 150 MG PO TB12: 150.0000 mg | ORAL_TABLET | Freq: Two times a day (BID) | ORAL | Status: DC

## 2014-11-10 NOTE — Telephone Encounter (Signed)
Patient requested refill on medications. Patient made an appointment for physical with provider. Informed her that she needed to keep that appointment. Patient switching pharmacys

## 2014-11-10 NOTE — Telephone Encounter (Signed)
Received Fax order for patient's medications. For Bupropion, Montelukast and Pravastatin. Patient has not been seen in a year and no upcoming appointment scheduled. Last note Dr. Renato Gailseed stated that she wanted to see her back in 6 months. Sent request back stating that patient needs an appointment.

## 2014-11-17 ENCOUNTER — Telehealth: Payer: Self-pay | Admitting: *Deleted

## 2014-11-17 NOTE — Telephone Encounter (Signed)
Received form from [employer] for Prior Authorization for [facility]. Form filled out and given to Dr. Renato Gailseed to review and sign.

## 2014-11-23 NOTE — Telephone Encounter (Signed)
Per Dr. Reed--send to Pulmonary, they prescribed this for her (we may have refilled it) Faxed to [facility] Dr. Coralyn HellingVineet Sood. Fax # [phone] #: [phone]

## 2014-11-23 NOTE — Telephone Encounter (Addendum)
[facility] Denied Medication. Case #: [id number] # [id number] Fax #: [phone] Given determination to Dr. Renato Gailseed.

## 2014-12-01 ENCOUNTER — Telehealth: Payer: Self-pay | Admitting: Pulmonary Disease

## 2014-12-01 NOTE — Telephone Encounter (Signed)
Will forward to [place] to follow up on as she has the pt's formulary.

## 2014-12-01 NOTE — Telephone Encounter (Signed)
Will give forms to Dr Craige CottaSood in AM to review.

## 2014-12-02 NOTE — Telephone Encounter (Signed)
Dr Craige CottaSood please advise on medication changes [facility]. Formulary is in your look at folder to review.

## 2014-12-03 MED ORDER — FLUTICASONE FUROATE-VILANTEROL 100-25 MCG/INH IN AEPB: 1.0000 | INHALATION_SPRAY | Freq: Every day | RESPIRATORY_TRACT | Status: DC

## 2014-12-03 NOTE — Telephone Encounter (Signed)
Spoke with patient, aware of change of inhaler. [facility] [place] [facility] [address]. Nothing further needed.

## 2014-12-03 NOTE — Telephone Encounter (Signed)
LMTCB x 1 

## 2014-12-03 NOTE — Telephone Encounter (Signed)
Reviewed formulary alternatives.    Please send order for Breo 100-25, one puff daily.  Dispense 1 inhaler with 5 refills.  Advise her to rinse mouth after each use.  She should call back if her breathing gets worse with change to inhaler regimen.

## 2014-12-03 NOTE — Telephone Encounter (Signed)
Return call.Kaitlyn [name] °

## 2014-12-09 ENCOUNTER — Other Ambulatory Visit: Payer: Self-pay | Admitting: Pulmonary Disease

## 2014-12-30 ENCOUNTER — Ambulatory Visit: Payer: Self-pay | Admitting: Internal Medicine

## 2014-12-31 ENCOUNTER — Other Ambulatory Visit: Payer: Self-pay | Admitting: Internal Medicine

## 2014-12-31 NOTE — Telephone Encounter (Signed)
Needs appt

## 2014-12-31 NOTE — Telephone Encounter (Signed)
Last OV 10/06/2013, greater than 12 months ago. Please advise if ok to fill Tramadol  Pending OV 02/2015

## 2015-01-01 NOTE — Telephone Encounter (Signed)
Left message on voicemail for patient to return call when available   

## 2015-01-06 ENCOUNTER — Telehealth: Payer: Self-pay | Admitting: Pulmonary Disease

## 2015-01-06 NOTE — Telephone Encounter (Signed)
Pt is aware that she can switch back to [facility]. States that her insurnace will cover this. She does not need samples.

## 2015-01-06 NOTE — Telephone Encounter (Signed)
Patient says that she tried the [facility] and it isn't helping.  She says she has to use her rescue inhaler more since she has started using the [facility].  She wants to know if she can go back to taking [facility]ul[facility] again.  She said since she tried [place], can we do a PA to get the insurance company to pay for the [facility].  She wants to know if she can start back on the [facility] today or does she have to wait in between using Breo and Dulera.    Dr. Craige CottaSood, please advise.

## 2015-01-06 NOTE — Telephone Encounter (Signed)
Can we give her sample of dulera while PA is placed with insurance company.

## 2015-01-13 ENCOUNTER — Other Ambulatory Visit: Payer: Self-pay

## 2015-01-13 DIAGNOSIS — I10 Essential (primary) hypertension: Secondary | ICD-10-CM

## 2015-01-13 DIAGNOSIS — F32A Depression, unspecified: Secondary | ICD-10-CM

## 2015-01-13 DIAGNOSIS — F329 Major depressive disorder, single episode, unspecified: Secondary | ICD-10-CM

## 2015-01-13 MED ORDER — LOSARTAN POTASSIUM-HCTZ 100-12.5 MG PO TABS: 1.0000 | ORAL_TABLET | Freq: Every day | ORAL | Status: DC

## 2015-01-13 MED ORDER — BUPROPION HCL ER (SR) 150 MG PO TB12: 150.0000 mg | ORAL_TABLET | Freq: Two times a day (BID) | ORAL | Status: DC

## 2015-01-13 NOTE — Telephone Encounter (Signed)
Manufacture changed, needs medication sent [facility] to [facility] because [facility] changed manufactures and patient is not tolerating medication the same.  Removed [facility] and added [facility] [address]

## 2015-01-15 ENCOUNTER — Encounter: Payer: Self-pay | Admitting: Internal Medicine

## 2015-01-15 ENCOUNTER — Ambulatory Visit (INDEPENDENT_AMBULATORY_CARE_PROVIDER_SITE_OTHER): Payer: PRIVATE HEALTH INSURANCE | Admitting: Internal Medicine

## 2015-01-15 VITALS — BP 140/82 | HR 83 | Temp 98.3°F | Resp 20 | Ht 67.0 in | Wt 286.8 lb

## 2015-01-15 DIAGNOSIS — G8929 Other chronic pain: Secondary | ICD-10-CM

## 2015-01-15 DIAGNOSIS — G5602 Carpal tunnel syndrome, left upper limb: Secondary | ICD-10-CM | POA: Diagnosis not present

## 2015-01-15 DIAGNOSIS — M79646 Pain in unspecified finger(s): Secondary | ICD-10-CM | POA: Diagnosis not present

## 2015-01-15 DIAGNOSIS — E669 Obesity, unspecified: Secondary | ICD-10-CM

## 2015-01-15 DIAGNOSIS — M79645 Pain in left finger(s): Secondary | ICD-10-CM

## 2015-01-15 DIAGNOSIS — G5601 Carpal tunnel syndrome, right upper limb: Secondary | ICD-10-CM | POA: Diagnosis not present

## 2015-01-15 DIAGNOSIS — D860 Sarcoidosis of lung: Secondary | ICD-10-CM

## 2015-01-15 DIAGNOSIS — G5603 Carpal tunnel syndrome, bilateral upper limbs: Secondary | ICD-10-CM

## 2015-01-15 MED ORDER — TRAMADOL HCL 50 MG PO TABS: 50.0000 mg | ORAL_TABLET | Freq: Four times a day (QID) | ORAL | Status: DC | PRN

## 2015-01-15 NOTE — Patient Instructions (Signed)
Recommend carpal tunnel stretching exercises twice daily as tolerated.  Follow up with Ortho as scheduled   Continue pain control with Tramadol  Keep appt with Dr Renato Gailseed next month as scheduled

## 2015-01-15 NOTE — Progress Notes (Signed)
Patient ID: Kaitlyn [name], female   DOB: 1966-01-20, 49 y.o.   MRN: [id number]    Facility  [facility]    Place of Service:   OFFICE   Allergies  Allergen Reactions  . Codeine Other (See Comments)    Reaction unknown  . Cyclinex [Tetracycline Hcl] Other (See Comments)    Reaction unknown, all "cyclines"  . Milk Thistle Hives  . Morphine And Related Other (See Comments)    Reaction unknown    Chief Complaint  Patient presents with  . Acute Visit    Patient c/o bilateral hand pain  . Medication Refill    Renew Tramadol    HPI:  49 yo female seen today for hand pain. She saw Ortho and was dx with b/l thumb arthritis and b/l CTS. She does not desire sx at this time. No numbness or tingling. Pain is 10/10 on scale most times. She wears b/l carpal tunnel splint. Frequently massages thumbs and wrists. Grip strength is worse and she frequently drops objects and has difficulty holding pencil/pen to write. Needs tramadol RF. Takes weekly MTX for pulmonary sarcoidosis. Has a f/u with Dr Renato Gails next month  She takes phentermine for obesity and is doing well. She is followed by weight loss provider.    Past Medical History  Diagnosis Date  . Hyperlipidemia     takes pravachol  . Obesity   . Anxiety   . Hidradenitis   . Solitary pulmonary nodule   . Osteoarthritis   . Varicose vein   . PONV (postoperative nausea and vomiting)   . Hypertension     takes medications daily  . Asthma     inhalers   . Shortness of breath     exertion  . Carpal tunnel syndrome     bilateral  . Depression     takes wellbutrin  . 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 Calcium deposits in  tendon and bursa   . Sarcoidosis   . Contracture of joint of other specified site   . Cramp of limb   . Other acute otitis externa   . Cellulitis and abscess of finger, unspecified   . Unspecified nasal polyp   . Pain in joint, ankle and foot   . Carpal tunnel syndrome   . Pain in joint, hand   . Obesity, unspecified   . Keloid scar   . Tobacco use disorder   . Other and unspecified special symptom or syndrome, not elsewhere classified   . Extrinsic asthma, unspecified   . Unspecified constipation   . Osteoarthrosis, unspecified whether generalized or localized, unspecified site   . Pain in joint, pelvic region and thigh   . Pain in joint, lower leg    Past Surgical History  Procedure Laterality Date  . Nasal sinus surgery  2011  . Other surgical history  2010    cyst removal from right middle finger    . Multiple tooth extractions  2010  . Bronchoscopy  03/01/12  . Rhinoplasty  08/2009  . Removal of growth from finger      DR MEYERDIERKS   History   Social History  . Marital Status: Single  Spouse Name: N/A  . Number of Children: 0  . Years of Education: N/A   Occupational History  . CNA   .  [employer]   Social History Main Topics  . Smoking status: Former Smoker -- 1.00 packs/day for 35 years    Types: Cigarettes    Quit date: 01/22/2012  . Smokeless tobacco: Never Used     Comment: no longer using e-cig 03/18/13  . Alcohol Use: Yes     Comment: occasionally  . Drug Use: No  . Sexual Activity: Not on file   Other Topics Concern  . None   Social History Narrative    Medications: Patient's Medications  New Prescriptions   No medications on file  Previous Medications   ACETAMINOPHEN (TYLENOL) 500 MG TABLET    Take 500 mg by mouth every 6 (six) hours as needed. For pain   ALBUTEROL (PROVENTIL HFA;VENTOLIN HFA) 108 (90 BASE) MCG/ACT INHALER    Inhale 2 puffs into the lungs every 4 (four) hours as needed. For shortness of  breath   BUPROPION (WELLBUTRIN SR) 150 MG 12 HR TABLET    Take 1 tablet (150 mg total) by mouth 2 (two) times daily.   CALCIUM CARBONATE (TUMS - DOSED IN MG ELEMENTAL CALCIUM) 500 MG CHEWABLE TABLET    Chew 1 tablet by mouth daily as needed. For indigestion   CEPHALEXIN (KEFLEX) 500 MG CAPSULE    Take  BID QOD-(Tues, thurs, sat, and sun)   CHOLECALCIFEROL (VITAMIN D-3 PO)    Take 5 capsules by mouth 2 (two) times a week.   CLINDAMYCIN (CLEOCIN T) 1 % LOTION    Apply 1 application topically as needed.    EFLORNITHINE HCL (VANIQA) 13.9 % CREAM    Apply topically 2 (two) times daily as needed.    FLUTICASONE FUROATE-VILANTEROL (BREO ELLIPTA) 100-25 MCG/INH AEPB    Inhale 1 puff into the lungs daily.   FOLIC ACID (FOLVITE) 1 MG TABLET    TAKE 1 TABLET BY MOUTH EVERY DAY-- except on days when taking Methotrexate   IBUPROFEN (ADVIL,MOTRIN) 800 MG TABLET    Take 800 mg by mouth 4 (four) times daily as needed. For pain   IPRATROPIUM-ALBUTEROL (DUONEB) 0.5-2.5 (3) MG/3ML SOLN    Take 3 mLs by nebulization 2 (two) times daily as needed. For shortness of breath   LOSARTAN-HYDROCHLOROTHIAZIDE (HYZAAR) 100-12.5 MG PER TABLET    Take 1 tablet by mouth daily.   MAGNESIUM CITRATE SOLN    Take 296 mLs (1 Bottle total) by mouth daily as needed for severe constipation. For constipation   MAGNESIUM OXIDE (MAG-OX) 400 MG TABLET    Take 400 mg by mouth daily.   METHOTREXATE (RHEUMATREX) 2.5 MG TABLET    TAKE 7 tablets BY MOUTH ONCE A WEEK   MOMETASONE-FORMOTEROL (DULERA) 100-5 MCG/ACT AERO    INHALE 2 PUFFS BY MOUTH TWICE DAILY   MONTELUKAST (SINGULAIR) 10 MG TABLET    Take one tablet by mouth once daily for allergies   PHENTERMINE 37.5 MG CAPSULE    Take 1/2 tablet BID   PRAVASTATIN (PRAVACHOL) 40 MG TABLET    Take 1 tablet (40 mg total) by mouth daily.   PYRIDOXINE (VITAMIN B-6) 50 MG TABLET    Take 50 mg by mouth daily.   SULFAMETHOXAZOLE-TRIMETHOPRIM (BACTRIM,SEPTRA) 400-80 MG PER TABLET    TAKE 1 TABLET BY  MOUTH THREE TIMES WEEKLY   TIOTROPIUM (SPIRIVA HANDIHALER) 18 MCG INHALATION CAPSULE    Place 1 capsule (18  mcg total) into inhaler and inhale daily as needed.   TRAMADOL (ULTRAM) 50 MG TABLET    TAKE 1 TABLET BY MOUTH 4 TIMES A DAY AS NEEDED   VITAMIN B-12 (CYANOCOBALAMIN) 100 MCG TABLET    Take 100 mcg by mouth daily.   ZINC GLUCONATE 50 MG TABLET    Take 50 mg by mouth daily.  Modified Medications   No medications on file  Discontinued Medications   No medications on file     Review of Systems  Constitutional: Negative for fever, chills and fatigue.  Musculoskeletal: Positive for arthralgias. Negative for myalgias, gait problem and neck pain.  Skin: Negative for rash.  Neurological: Positive for weakness. Negative for numbness.  Psychiatric/Behavioral: Negative for self-injury.    Filed Vitals:   01/15/15 0850  BP: 140/82  Pulse: 83  Temp: 98.3 F (36.8 C)  TempSrc: Oral  Resp: 20  Height: 5\' 7"  (1.702 m)  Weight: 286 lb 12.8 oz (130.092 kg)  SpO2: 99%   Body mass index is 44.91 kg/(m^2).  Physical Exam  Constitutional: She is oriented to person, place, and time. She appears well-developed and well-nourished.  Looks uncomfortable  Musculoskeletal: She exhibits edema and tenderness.       Right wrist: She exhibits decreased range of motion, tenderness, swelling and crepitus. She exhibits no bony tenderness, no effusion and no deformity.       Right hand: She exhibits decreased range of motion (at thumb base), tenderness (thumb base) and swelling (base of thumb). She exhibits no bony tenderness and no deformity. Decreased strength noted. She exhibits thumb/finger opposition.       Left hand: She exhibits tenderness (min at thumb base) and swelling. She exhibits normal range of motion and no deformity. Lacerations: min at thumb. Normal strength noted.  Right (+) Tinel's test  Neurological: She is alert and oriented to person, place, and time.  Skin: Skin is warm and dry. No  rash noted. No erythema.  Psychiatric: She has a normal mood and affect. Her behavior is normal. Judgment and thought content normal.     Labs reviewed: No visits with results within 3 Month(s) from this visit. Latest known visit with results is:  Appointment on 09/21/2014  Component Date Value Ref Range Status  . WBC 09/21/2014 6.5  4.0 - 10.5 K/uL Final  . RBC 09/21/2014 3.89  3.87 - 5.11 Mil/uL Final  . Hemoglobin 09/21/2014 12.1  12.0 - 15.0 g/dL Final  . HCT 40/98/119111/23/2015 36.9  36.0 - 46.0 % Final  . MCV 09/21/2014 94.9  78.0 - 100.0 fl Final  . MCHC 09/21/2014 32.7  30.0 - 36.0 g/dL Final  . RDW 47/82/956211/23/2015 16.1* 11.5 - 15.5 % Final  . Platelets 09/21/2014 242.0  150.0 - 400.0 K/uL Final  . Neutrophils Relative % 09/21/2014 76.5  43.0 - 77.0 % Final  . Lymphocytes Relative 09/21/2014 11.9* 12.0 - 46.0 % Final  . Monocytes Relative 09/21/2014 8.3  3.0 - 12.0 % Final  . Eosinophils Relative 09/21/2014 3.1  0.0 - 5.0 % Final  . Basophils Relative 09/21/2014 0.2  0.0 - 3.0 % Final  . Neutro Abs 09/21/2014 5.0  1.4 - 7.7 K/uL Final  . Lymphs Abs 09/21/2014 0.8  0.7 - 4.0 K/uL Final  . Monocytes Absolute 09/21/2014 0.5  0.1 - 1.0 K/uL Final  . Eosinophils Absolute 09/21/2014 0.2  0.0 - 0.7 K/uL Final  . Basophils Absolute 09/21/2014 0.0  0.0 - 0.1 K/uL Final  . Sodium 09/21/2014  140  135 - 145 mEq/L Final  . Potassium 09/21/2014 3.7  3.5 - 5.1 mEq/L Final  . Chloride 09/21/2014 104  96 - 112 mEq/L Final  . CO2 09/21/2014 29  19 - 32 mEq/L Final  . Glucose, Bld 09/21/2014 70  70 - 99 mg/dL Final  . BUN 81/19/1478 9  6 - 23 mg/dL Final  . Creatinine, Ser 09/21/2014 1.1  0.4 - 1.2 mg/dL Final  . Total Bilirubin 09/21/2014 0.3  0.2 - 1.2 mg/dL Final  . Alkaline Phosphatase 09/21/2014 113  39 - 117 U/L Final  . AST 09/21/2014 26  0 - 37 U/L Final  . ALT 09/21/2014 18  0 - 35 U/L Final  . Total Protein 09/21/2014 7.2  6.0 - 8.3 g/dL Final  . Albumin 29/56/2130 3.3* 3.5 - 5.2 g/dL Final   . Calcium 86/57/8469 9.2  8.4 - 10.5 mg/dL Final  . GFR 62/95/2841 69.20  >60.00 mL/min Final     Assessment/Plan   ICD-9-CM ICD-10-CM   1. Chronic thumb pain, bilateral 729.5 M79.646    338.29 G89.29    due to arthritis - continue tramadol   2. Carpal tunnel syndrome, bilateral- R>L with impact on QOL 354.0 G56.01     G56.02   3. Obesity - improving on phentermine 278.00 E66.9   4. Pulmonary sarcoidosis - stable on MTX 135 D86.0    517.8     --Recommend carpal tunnel stretching exercises twice daily as tolerated.  --Follow up with Ortho as scheduled  --Continue pain control with Tramadol - new Rx written today  --Keep appt with Dr Renato Gails next month as scheduled  --RTO if pain does not improve or sx's worsen  Lidia Clavijo S. Ancil Linsey  [facility] [address] [place], [place] [address] [phone] Office (Wednesdays and Fridays 8 AM - 5 PM) [phone] Cell (Monday-Friday 8 AM - 5 PM)

## 2015-01-20 ENCOUNTER — Other Ambulatory Visit: Payer: Self-pay | Admitting: Internal Medicine

## 2015-01-23 ENCOUNTER — Other Ambulatory Visit: Payer: Self-pay | Admitting: Internal Medicine

## 2015-01-27 ENCOUNTER — Telehealth: Payer: Self-pay | Admitting: Pulmonary Disease

## 2015-01-27 ENCOUNTER — Other Ambulatory Visit: Payer: Self-pay | Admitting: Internal Medicine

## 2015-01-27 DIAGNOSIS — Z1231 Encounter for screening mammogram for malignant neoplasm of breast: Secondary | ICD-10-CM

## 2015-01-27 NOTE — Telephone Encounter (Signed)
Advised pt that VS does not mind his pts seeing TP. ROV had already been scheduled with TP in April. Nothing further was needed.

## 2015-01-28 ENCOUNTER — Ambulatory Visit (HOSPITAL_COMMUNITY): Payer: PRIVATE HEALTH INSURANCE

## 2015-01-29 ENCOUNTER — Ambulatory Visit (HOSPITAL_COMMUNITY)
Admission: RE | Admit: 2015-01-29 | Discharge: 2015-01-29 | Disposition: A | Discharge status: Home / Self Care | Payer: PRIVATE HEALTH INSURANCE | Source: Ambulatory Visit | Attending: Internal Medicine | Admitting: Internal Medicine

## 2015-01-29 ENCOUNTER — Other Ambulatory Visit: Payer: Self-pay | Admitting: Internal Medicine

## 2015-01-29 DIAGNOSIS — G5600 Carpal tunnel syndrome, unspecified upper limb: Secondary | ICD-10-CM

## 2015-01-29 DIAGNOSIS — Z1231 Encounter for screening mammogram for malignant neoplasm of breast: Secondary | ICD-10-CM

## 2015-01-29 HISTORY — DX: Carpal tunnel syndrome, unspecified upper limb: G56.00

## 2015-02-02 ENCOUNTER — Encounter (HOSPITAL_BASED_OUTPATIENT_CLINIC_OR_DEPARTMENT_OTHER): Payer: Self-pay | Admitting: *Deleted

## 2015-02-03 ENCOUNTER — Encounter (HOSPITAL_BASED_OUTPATIENT_CLINIC_OR_DEPARTMENT_OTHER): Payer: Self-pay | Admitting: *Deleted

## 2015-02-03 ENCOUNTER — Other Ambulatory Visit: Payer: Self-pay | Admitting: Orthopedic Surgery

## 2015-02-03 NOTE — Progress Notes (Signed)
Pt to stop phenterime-will need istat and ekg

## 2015-02-03 NOTE — Progress Notes (Signed)
   02/03/15 1007  OBSTRUCTIVE SLEEP APNEA  Have you ever been diagnosed with sleep apnea through a sleep study? No  Do you snore loudly (loud enough to be heard through closed doors)?  1  Do you often feel tired, fatigued, or sleepy during the daytime? 0  Has anyone observed you stop breathing during your sleep? 0  Do you have, or are you being treated for high blood pressure? 1  BMI more than 35 kg/m2? 1  Age over [address] years old? 0  Neck circumference greater than 40 cm/16 inches? 1  Gender: 0  Obstructive Sleep Apnea Score 4

## 2015-02-04 ENCOUNTER — Other Ambulatory Visit (INDEPENDENT_AMBULATORY_CARE_PROVIDER_SITE_OTHER): Payer: PRIVATE HEALTH INSURANCE

## 2015-02-04 ENCOUNTER — Telehealth: Payer: Self-pay | Admitting: Pulmonary Disease

## 2015-02-04 ENCOUNTER — Ambulatory Visit (INDEPENDENT_AMBULATORY_CARE_PROVIDER_SITE_OTHER): Payer: PRIVATE HEALTH INSURANCE | Admitting: Adult Health

## 2015-02-04 ENCOUNTER — Encounter: Payer: Self-pay | Admitting: Adult Health

## 2015-02-04 VITALS — BP 122/70 | HR 68 | Temp 98.1°F | Ht 67.0 in | Wt 291.0 lb

## 2015-02-04 DIAGNOSIS — D860 Sarcoidosis of lung: Secondary | ICD-10-CM

## 2015-02-04 DIAGNOSIS — J449 Chronic obstructive pulmonary disease, unspecified: Secondary | ICD-10-CM

## 2015-02-04 LAB — CBC WITH DIFFERENTIAL/PLATELET
Basophils Absolute: 0 10*3/uL (ref 0.0–0.1)
Basophils Relative: 0.4 % (ref 0.0–3.0)
EOS PCT: 2.5 % (ref 0.0–5.0)
Eosinophils Absolute: 0.2 10*3/uL (ref 0.0–0.7)
HEMATOCRIT: 36.4 % (ref 36.0–46.0)
HEMOGLOBIN: 12.4 g/dL (ref 12.0–15.0)
LYMPHS PCT: 13.8 % (ref 12.0–46.0)
Lymphs Abs: 1.1 10*3/uL (ref 0.7–4.0)
MCHC: 34.2 g/dL (ref 30.0–36.0)
MCV: 93.5 fl (ref 78.0–100.0)
MONO ABS: 0.4 10*3/uL (ref 0.1–1.0)
Monocytes Relative: 5.1 % (ref 3.0–12.0)
NEUTROS ABS: 6.4 10*3/uL (ref 1.4–7.7)
Neutrophils Relative %: 78.2 % — ABNORMAL HIGH (ref 43.0–77.0)
Platelets: 253 10*3/uL (ref 150.0–400.0)
RBC: 3.89 Mil/uL (ref 3.87–5.11)
RDW: 16.3 % — ABNORMAL HIGH (ref 11.5–15.5)
WBC: 8.2 10*3/uL (ref 4.0–10.5)

## 2015-02-04 LAB — COMPREHENSIVE METABOLIC PANEL
ALK PHOS: 116 U/L (ref 39–117)
ALT: 19 U/L (ref 0–35)
AST: 21 U/L (ref 0–37)
Albumin: 3.5 g/dL (ref 3.5–5.2)
BILIRUBIN TOTAL: 0.3 mg/dL (ref 0.2–1.2)
BUN: 14 mg/dL (ref 6–23)
CO2: 34 mEq/L — ABNORMAL HIGH (ref 19–32)
Calcium: 9.5 mg/dL (ref 8.4–10.5)
Chloride: 101 mEq/L (ref 96–112)
Creatinine, Ser: 1.2 mg/dL (ref 0.40–1.20)
GFR: 61.18 mL/min (ref >60.00)
Glucose, Bld: 96 mg/dL (ref 70–99)
POTASSIUM: 3.9 meq/L (ref 3.5–5.1)
Sodium: 138 mEq/L (ref 135–145)
Total Protein: 7.3 g/dL (ref 6.0–8.3)

## 2015-02-04 NOTE — Assessment & Plan Note (Signed)
Sarcoid appears to be responding to methotrexate with recent CT showing decreased adenopathy and areas of inflammation. Patient has had no flare of symptoms with cough or shortness of breath with titrating dose of methotrexate. We'll continue to slowly go down her methotrexate dose. Labs today. Unclear if her arthritic complaints are due to sarcoid. Patient does have a history of carpal tunnel and osteoarthritis in her hands. Advised her if symptoms significantly worsened on lower dose of methotrexate to call our office back.  Plan  Decrease Methotrexate 4 pills weekly , stay on this dose until seen back in office  Labs today  Follow up Dr. Craige CottaSood  In 3 months and As needed

## 2015-02-04 NOTE — Patient Instructions (Signed)
Decrease Methotrexate 4 pills weekly , stay on this dose until seen back in office  Labs today  Continue on BREO and Spiriva .  Follow up Dr. Craige CottaSood  In 3 months and As needed

## 2015-02-04 NOTE — Progress Notes (Signed)
Subjective:    Patient ID: Kaitlyn [name], female    DOB: April 16, 1966, 49 y.o.   MRN: [id number]  HPI  Kaitlyn [name] is a 49 y.o. female former smoker with sarcoidosis, and COPD.  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 CT chest was done last visit and showed improved lymphadenopathy along with decreased areas of inflammation. Patient was instructed to decrease her methotrexate slowly down from 7 pills weekly and is now currently on 5 pills weekly. She had tried off of her Spiriva last visit. However, felt her breathing worsened and restarted. She is now on BREO and Spiriva and feels that her breathing has improved. She denies cough flare. She denies any rash, chest pain, shortness of breath, visual changes. Says she had a recent eye exam this week. She does complain that she's been having difficulty with arthritis in her hands, especially both thumbs. She was seen by her orthopedist yesterday. Recommended to take naproxen and where thumb braces. She was told she had arthritis in her thumbs along with her chronic carpal tunnel syndrome. We discussed further titration of her methotrexate. She is somewhat reluctant as she is concerned that her arthritis is acting up because her methotrexate is being decreased.   Tests/Events: EBUS with Tbx 03/01/12>>Streptococcus pneumoniae in BAL, cytology/Tbx negative 04/30/12 start prednisone Labs 04/29/13 >> Hep B S Ag negative, Hep B S Ab reactive, Hep B core Ab negative, Hep B core IgM negative, HCV Ab negative 04/30/13 Start MTX 11/30/13 Off prednisone   CT CHEST: CT chest 01/23/12>>bulky mediastinal/hilar LAN.  Scattered ill-defined upper lobe predominate perilymphatic and centrilobular nodules. CT chest 04/30/12>>No change in the appearance of the mediastinal and hilar adenopathy and pulmonary nodularity CT chest 07/31/12>>b/l hilar adenopathy no change, b/l upper lobe subpleural nodularity,  improved LUL GGO CT chest 04/14/13 >> peribronchovascular nodularity b/l upper lobes Rt > Lt mildly progressed, no change to mediastinal/hilar adenopathy CT chest 01/05/14 >> upper lobe predominant prebronchovascular reticulo-nodule opacities, 1.2 cm Rt paratracheal LAN, 1.6 cm subcarinal LAN, 1.7 cm Rt hilar LAN  CT chest 10/07/14 >The chronic parenchymal changes in the lungs appears similar to the prior study, and remain in a perilymphatic distribution, compatible with the patient's reported clinical history ofsarcoidosis. Mediastinal and bilateral hilar adenopathy appears less pronounced than the prior study, suggesting some positive response to therapy.  PULMONARY FUNCTION TESTS: PFT 03/13/12>>FEV1 1.65 (57%), FEV1% 58, TLC 4.44 (79%), DLCO 62%, no BD PFT 03/18/13 >> FEV1 1.78 (69%), FEV1% 67, TLC 4.04 (75%), DLCO 92%, no BD PFT 01/13/14 >> FEV1 1.64 (70%), FEV1% 70, TLC 3.69 (68%), DLCO    Review of Systems Constitutional:   No  weight loss, night sweats,  Fevers, chills,  +fatigue, or  lassitude.  HEENT:   No headaches,  Difficulty swallowing,  Tooth/dental problems, or  Sore throat,                No sneezing, itching, ear ache,nasal congestion, post nasal drip,   CV:  No chest pain,  Orthopnea, PND, swelling in lower extremities, anasarca, dizziness, palpitations, syncope.   GI  No heartburn, indigestion, abdominal pain, nausea, vomiting, diarrhea, change in bowel habits, loss of appetite, bloody stools.   Resp: No shortness of breath with exertion or at rest.  No excess mucus, no productive cough,  No non-productive cough,  No coughing up of blood.  No change in color of mucus.  No wheezing.  No chest wall deformity  Skin: no rash or lesions.  GU: no dysuria, change in color of urine, no urgency or frequency.  No flank pain, no hematuria   MS: + joint pain .  Psych:  No change in mood or affect. No depression or anxiety.  No memory loss.         Objective:   Physical  Exam        Assessment & Plan:

## 2015-02-04 NOTE — Telephone Encounter (Signed)
Pt is in need of a brand new nebulizer machine. Her dog has chewed the power cord. Order will be placed for this. Nothing further was needed.

## 2015-02-04 NOTE — Assessment & Plan Note (Signed)
Compensated on present regimen  Plan  Continue on BREO and Spiriva .  Follow up Dr. Craige CottaSood  In 3 months and As needed

## 2015-02-05 ENCOUNTER — Ambulatory Visit (HOSPITAL_BASED_OUTPATIENT_CLINIC_OR_DEPARTMENT_OTHER)
Admission: RE | Admit: 2015-02-05 | Payer: PRIVATE HEALTH INSURANCE | Source: Ambulatory Visit | Admitting: Orthopedic Surgery

## 2015-02-05 HISTORY — DX: Presence of dental prosthetic device (complete) (partial): Z97.2

## 2015-02-05 SURGERY — CARPAL TUNNEL RELEASE
Anesthesia: General | Laterality: Right

## 2015-02-05 NOTE — Progress Notes (Signed)
Reviewed and agree with assessment/plan. 

## 2015-03-11 ENCOUNTER — Encounter: Payer: PRIVATE HEALTH INSURANCE | Admitting: Internal Medicine

## 2015-05-26 ENCOUNTER — Other Ambulatory Visit: Payer: Self-pay | Admitting: Pulmonary Disease

## 2015-06-24 ENCOUNTER — Encounter: Payer: Self-pay | Admitting: Internal Medicine

## 2015-06-24 ENCOUNTER — Ambulatory Visit (INDEPENDENT_AMBULATORY_CARE_PROVIDER_SITE_OTHER): Payer: PRIVATE HEALTH INSURANCE | Admitting: Internal Medicine

## 2015-06-24 VITALS — BP 160/80 | HR 72 | Temp 98.9°F | Resp 20 | Ht 67.0 in | Wt 292.0 lb

## 2015-06-24 DIAGNOSIS — M722 Plantar fascial fibromatosis: Secondary | ICD-10-CM | POA: Diagnosis not present

## 2015-06-24 DIAGNOSIS — M79672 Pain in left foot: Secondary | ICD-10-CM | POA: Diagnosis not present

## 2015-06-24 DIAGNOSIS — F329 Major depressive disorder, single episode, unspecified: Secondary | ICD-10-CM | POA: Diagnosis not present

## 2015-06-24 DIAGNOSIS — F32A Depression, unspecified: Secondary | ICD-10-CM

## 2015-06-24 DIAGNOSIS — D860 Sarcoidosis of lung: Secondary | ICD-10-CM

## 2015-06-24 MED ORDER — LEVOMILNACIPRAN HCL ER 20 & 40 MG PO C4PK: EXTENDED_RELEASE_CAPSULE | ORAL | Status: DC

## 2015-06-24 MED ORDER — PHENTERMINE HCL 37.5 MG PO CAPS: ORAL_CAPSULE | ORAL | Status: DC

## 2015-06-24 NOTE — Progress Notes (Signed)
Patient ID: Kaitlyn [name], female   DOB: Oct 09, 1966, 49 y.o.   MRN: [id number]   Location:  [facility]  Code Status: full code Goals of Care: Advanced Directive information Does patient have an advance directive?: No, Would patient like information on creating an advanced directive?: Yes - Educational materials given  Chief Complaint  Patient presents with  . Acute Visit    left heel pain since June,     HPI: Patient is a 49 y.o. black female seen in the office today for an acute visit for her left heel pain.    Left heel pain since June.  All around her left heel.  Rubbing not helping.  Wearing her sneakers not helping.  Has had bone spurs.  Uses tramadol for the spurs on her hands.   Right foot has known spur.  Aches all of the time.  Has been doing the plantar fasciitis exercises/stretches.  Shots did give her relief before.  Has flat feet.  Was down to 272lbs.  Was getting phentermine from there, but gained 20 lbs back when she stopped it.  Keeps eating even thought she knows she shouldn't.  Was on 37.5mg  and broke in 1/2.  Took 1/2 in am and 1/2 in pm.  When gets home, she's tired, will do something with her mom.  Does not get much exercise--did move her equipment to the living room.  Depression:  Comes and goes.  Not very motivated.  Not wanting to get up and go out.  Takes wellbutrin and when missed a few pills she was weepy and tearful and doesn't think she'll tolerate a switch.    Has right thumb arthritis, too.    Review of Systems:  Review of Systems  Constitutional: Positive for malaise/fatigue. Negative for fever and chills.       Wt loss then weight gain  HENT: Negative for congestion and hearing loss.   Eyes: Negative for blurred vision.  Respiratory: Negative for shortness of breath.   Cardiovascular: Negative for chest pain and leg swelling.  Gastrointestinal: Negative for abdominal pain, constipation, blood in stool and  melena.  Genitourinary: Negative for dysuria, urgency and frequency.  Musculoskeletal: Negative for falls.       Left heel pain  Skin: Negative for rash.  Neurological: Negative for dizziness.  Endo/Heme/Allergies: Does not bruise/bleed easily.  Psychiatric/Behavioral: Positive for depression. Negative for memory loss. The patient is nervous/anxious and has insomnia.     Past Medical History  Diagnosis Date  . Hyperlipidemia     takes pravachol  . Obesity   . Anxiety   . Hidradenitis   . Osteoarthritis   . PONV (postoperative nausea and vomiting)   . Asthma     inhalers   . Shortness of breath     exertion  . 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 Sarcoidosis   . Keloid scar   . Extrinsic asthma, unspecified   . Osteoarthrosis, unspecified whether generalized or localized, unspecified site   . Carpal tunnel syndrome 01/2015    right  . Hypertension   . Depression   . Wears dentures     top  Past Surgical History  Procedure Laterality Date  . Other surgical history  2010    cyst removal from right middle finger    . Multiple tooth extractions  2010  . Removal of growth from finger      DR MEYERDIERKS  . Video bronchoscopy with endobronchial ultrasound  03/01/2012  . Open reduction nasal fracture  06/29/2009    with exc. bx. left intranasal papilloma  . Nasal turbinate reduction Bilateral 06/29/2009    inferior    Allergies  Allergen Reactions  . Codeine Other (See Comments)    Reaction unknown  . Cyclinex [Tetracycline Hcl] Other (See Comments)    Reaction unknown, all "cyclines"  . Milk Thistle Hives  . Morphine And Related Other (See Comments)     Reaction unknown   Medications: Patient's Medications  New Prescriptions   No medications on file  Previous Medications   ACETAMINOPHEN (TYLENOL) 500 MG TABLET    Take 500 mg by mouth every 6 (six) hours as needed. For pain   ALBUTEROL (PROVENTIL HFA;VENTOLIN HFA) 108 (90 BASE) MCG/ACT INHALER    Inhale 2 puffs into the lungs every 4 (four) hours as needed. For shortness of breath   BUPROPION (WELLBUTRIN SR) 150 MG 12 HR TABLET    Take 1 tablet (150 mg total) by mouth 2 (two) times daily.   CALCIUM CARBONATE (TUMS - DOSED IN MG ELEMENTAL CALCIUM) 500 MG CHEWABLE TABLET    Chew 1 tablet by mouth daily as needed. For indigestion   CEPHALEXIN (KEFLEX) 500 MG CAPSULE    Take 500mg  BID QOD-(Tues, thurs, sat, and sun)   CHOLECALCIFEROL (VITAMIN D-3 PO)    Take 5 capsules by mouth 2 (two) times a week.   CLINDAMYCIN (CLEOCIN T) 1 % LOTION    Apply 1 application topically as needed.    EFLORNITHINE HCL (VANIQA) 13.9 % CREAM    Apply topically 2 (two) times daily as needed.    FLUTICASONE FUROATE-VILANTEROL (BREO ELLIPTA) 100-25 MCG/INH AEPB    Inhale 1 puff into the lungs daily.   FOLIC ACID (FOLVITE) 1 MG TABLET    TAKE 1 TABLET BY MOUTH EVERY DAY-- except on days when taking Methotrexate   IPRATROPIUM-ALBUTEROL (DUONEB) 0.5-2.5 (3) MG/3ML SOLN    Take 3 mLs by nebulization 2 (two) times daily as needed. For shortness of breath   LOSARTAN-HYDROCHLOROTHIAZIDE (HYZAAR) 100-12.5 MG PER TABLET    TAKE ONE TABLET BY MOUTH EVERY DAY   MAGNESIUM CITRATE SOLN    Take 296 mLs (1 Bottle total) by mouth daily as needed for severe constipation. For constipation   MAGNESIUM OXIDE (MAG-OX) 400 MG TABLET    Take 400 mg by mouth daily.   METHOTREXATE (RHEUMATREX) 2.5 MG TABLET    Take 4 tablets (10 mg total) by mouth once a week.   MOMETASONE-FORMOTEROL (DULERA) 100-5 MCG/ACT AERO    Inhale 2 puffs into the lungs 2 (two) times daily.   MONTELUKAST (SINGULAIR) 10 MG TABLET    TAKE ONE TABLET BY MOUTH EVERY DAY AS  NEEDED FOR allergies   PHENTERMINE 37.5 MG CAPSULE    Take 1/2 tablet BID   PRAVASTATIN (PRAVACHOL) 40 MG TABLET    TAKE ONE TABLET BY MOUTH EVERY DAY   PYRIDOXINE (VITAMIN B-6) 50 MG TABLET    Take 50 mg by mouth daily.   SULFAMETHOXAZOLE-TRIMETHOPRIM (BACTRIM,SEPTRA) 400-80 MG PER TABLET    TAKE 1 TABLET BY MOUTH THREE TIMES WEEKLY   TIOTROPIUM (SPIRIVA HANDIHALER) 18 MCG INHALATION CAPSULE  Place 1 capsule (18 mcg total) into inhaler and inhale daily as needed.   TRAMADOL (ULTRAM) 50 MG TABLET    Take 1 tablet (50 mg total) by mouth every 6 (six) hours as needed for severe pain.   VITAMIN B-12 (CYANOCOBALAMIN) 100 MCG TABLET    Take 100 mcg by mouth daily.   ZINC GLUCONATE 50 MG TABLET    Take 50 mg by mouth daily.  Modified Medications   No medications on file  Discontinued Medications   No medications on file    Physical Exam: Filed Vitals:   06/24/15 1619  BP: 160/80  Pulse: 72  Temp: 98.9 F (37.2 C)  TempSrc: Oral  Resp: 20  Height: 5\' 7"  (1.702 m)  Weight: 292 lb (132.45 kg)  SpO2: 95%   Physical Exam  Constitutional: She is oriented to person, place, and time. She appears well-developed and well-nourished. No distress.  Obese black female   Cardiovascular: Normal rate, regular rhythm, normal heart sounds and intact distal pulses.   bp elevated today  Pulmonary/Chest: Effort normal and breath sounds normal. No respiratory distress.  Abdominal: Soft. Bowel sounds are normal. She exhibits no distension. There is no tenderness.  Musculoskeletal: She exhibits no tenderness.  Neurological: She is alert and oriented to person, place, and time.  Skin: Skin is warm and dry.  Psychiatric:  Talking fast, anxious, jittery    Labs reviewed: Basic Metabolic Panel:  Recent Labs  16/10/96 1037 02/04/15 1114  NA 140 138  K 3.7 3.9  CL 104 101  CO2 29 34*  GLUCOSE 70 96  BUN 9 14  CREATININE 1.1 1.20  CALCIUM 9.2 9.5   Liver Function Tests:  Recent Labs   09/21/14 1037 02/04/15 1114  AST 26 21  ALT 18 19  ALKPHOS 113 116  BILITOT 0.3 0.3  PROT 7.2 7.3  ALBUMIN 3.3* 3.5   No results for input(s): LIPASE, AMYLASE in the last 8760 hours. No results for input(s): AMMONIA in the last 8760 hours. CBC:  Recent Labs  09/21/14 1037 02/04/15 1114  WBC 6.5 8.2  NEUTROABS 5.0 6.4  HGB 12.1 12.4  HCT 36.9 36.4  MCV 94.9 93.5  PLT 242.0 253.0   Lipid Panel: No results for input(s): CHOL, HDL, LDLCALC, TRIG, CHOLHDL, LDLDIRECT in the last 8760 hours. Lab Results  Component Value Date   HGBA1C 5.6 10/06/2013   Assessment/Plan 1. Depression -will try her on fetzima titration pack, may cont wellbutrin also, but will plan to taper off if she does well with fetzima--when near completing starter pack, to update me on progress so new Rx can be sent for the 40mg  - Levomilnacipran HCl ER (FETZIMA TITRATION) 20 & 40 MG C4PK; Use as directed  Dispense: 1 each; Refill: 0  2. Morbid obesity - requests that I prescribe the phentermine for her short term--needs to get back on her exercise and diet routine--agreed to this temporarily---must come in for labs - phentermine 37.5 MG capsule; Take 1/2 tablet BID  Dispense: 30 capsule; Refill: 3 - CBC with Differential/Platelet; Future - Comprehensive metabolic panel; Future - Hemoglobin A1c; Future - Lipid panel; Future  3. Plantar fasciitis of right foot -cont stretches, comfortable shoes  4. Heel pain, left -suspect a spur and fasciitis in this foot, too -advised to do stretches, try inserts in shoes to support arches (has flat feet though), and f/u with podiatry if continues for possible steroid injection in foot  5. Pulmonary sarcoidosis -f/u with pulmonary, cont albuterol,  duonebs, dulera  Labs/tests ordered:   Orders Placed This Encounter  Procedures  . CBC with Differential/Platelet    Standing Status: Future     Number of Occurrences:      Standing Expiration Date: 06/23/2016  .  Comprehensive metabolic panel    Standing Status: Future     Number of Occurrences:      Standing Expiration Date: 06/23/2016    Order Specific Question:  Has the patient fasted?    Answer:  Yes  . Hemoglobin A1c    Standing Status: Future     Number of Occurrences:      Standing Expiration Date: 06/23/2016  . Lipid panel    Standing Status: Future     Number of Occurrences:      Standing Expiration Date: 06/23/2016    Order Specific Question:  Has the patient fasted?    Answer:  Yes    Next appt:  6 mos for med mgt, labs before  Dezarae Mcclaran L. Edenilson Austad, D.O. Geriatrics [facility] [address], [place] [address] Cell Phone (Mon-Fri 8am-5pm):  [phone] On Call:  [phone] & follow prompts after 5pm & weekends Office Phone:  [phone] Office Fax:  [phone]

## 2015-06-29 ENCOUNTER — Other Ambulatory Visit: Payer: Self-pay | Admitting: Pulmonary Disease

## 2015-07-01 ENCOUNTER — Encounter: Payer: PRIVATE HEALTH INSURANCE | Admitting: Internal Medicine

## 2015-07-12 ENCOUNTER — Encounter: Payer: Self-pay | Admitting: Pulmonary Disease

## 2015-07-12 ENCOUNTER — Ambulatory Visit (INDEPENDENT_AMBULATORY_CARE_PROVIDER_SITE_OTHER): Payer: Self-pay | Admitting: Pulmonary Disease

## 2015-07-12 VITALS — BP 122/72 | HR 81 | Temp 98.1°F | Ht 67.0 in | Wt 292.2 lb

## 2015-07-12 DIAGNOSIS — D860 Sarcoidosis of lung: Secondary | ICD-10-CM

## 2015-07-12 DIAGNOSIS — J449 Chronic obstructive pulmonary disease, unspecified: Secondary | ICD-10-CM

## 2015-07-12 DIAGNOSIS — Z23 Encounter for immunization: Secondary | ICD-10-CM

## 2015-07-12 NOTE — Patient Instructions (Signed)
Try using dulera two puffs twice per day instead of breo  Call in 1 week to update status after inhaler change  Flu shot today  Follow up in 3 months

## 2015-07-12 NOTE — Progress Notes (Signed)
Chief Complaint  Patient presents with  . Follow-up    pt states she has an increase in SOB since shes last been here. onetime causing chest pain right in the middle of her chest ( in July ) . pt using  inhaler as needed.     History of Present Illness: Kaitlyn [name] is a 49 y.o. female former smoker with sarcoidosis, and COPD.  She no longer has insurance.  She has noticed more trouble with cough, and dyspnea since changing from dulera to breo.  This was changed due to insurance coverage.  She is only using spiriva prn.  She is using methotrexate 4 pills weekly.  She denies fever, hemoptysis, skin rash, or gland swelling.  Tests/Events: EBUS with Tbx 03/01/12>>Streptococcus pneumoniae in BAL, cytology/Tbx negative 04/30/12 start prednisone Labs 04/29/13 >> Hep B S Ag negative, Hep B S Ab reactive, Hep B core Ab negative, Hep B core IgM negative, HCV Ab negative 04/30/13 Start MTX 11/30/13 Off prednisone   CT CHEST: CT chest 01/23/12>>bulky mediastinal/hilar LAN.  Scattered ill-defined upper lobe predominate perilymphatic and centrilobular nodules. CT chest 04/30/12>>No change in the appearance of the mediastinal and hilar adenopathy and pulmonary nodularity CT chest 07/31/12>>b/l hilar adenopathy no change, b/l upper lobe subpleural nodularity, improved LUL GGO CT chest 04/14/13 >> peribronchovascular nodularity b/l upper lobes Rt > Lt mildly progressed, no change to mediastinal/hilar adenopathy CT chest 01/05/14 >> upper lobe predominant prebronchovascular reticulo-nodule opacities, 1.2 cm Rt paratracheal LAN, 1.6 cm subcarinal LAN, 1.7 cm Rt hilar LAN  CT chest 10/07/14 >> decreased LAN  PULMONARY FUNCTION TESTS: PFT 03/13/12>>FEV1 1.65 (57%), FEV1% 58, TLC 4.44 (79%), DLCO 62%, no BD PFT 03/18/13 >> FEV1 1.78 (69%), FEV1% 67, TLC 4.04 (75%), DLCO 92%, no BD PFT 01/13/14 >> FEV1 1.64 (70%), FEV1% 70, TLC 3.69 (68%), DLCO 79%  PMHx >> HLD, Anxiety, Depression, Hidradenitis  PSHx,  Medications, Allergies, Fhx, Shx reviewed.  Physical Exam: BP 122/72 mmHg  Pulse 81  Temp(Src) 98.1 F (36.7 C)  Ht 5\' 7"  (1.702 m)  Wt 292 lb 3.2 oz (132.541 kg)  BMI 45.75 kg/m2  SpO2 98%  General - Obese  HEENT - No sinus tenderness, MP 3, no oral exudate, no LAN Cardiac - s1s2 regular, no murmur  Chest - no wheeze/rales, no dullness Abdomen - soft, + bowel sounds Extremities - no edema Neurologic - normal strength Skin - no rashes  Psychiatric - normal mood, behavior   CBC Latest Ref Rng 02/04/2015 09/21/2014 01/23/2014  WBC 4.0 - 10.5 K/uL 8.2 6.5 7.2  Hemoglobin 12.0 - 15.0 g/dL 09.8 11.9 14.7  Hematocrit 36.0 - 46.0 % 36.4 36.9 38.3  Platelets 150.0 - 400.0 K/uL 253.0 242.0 255.0    CMP Latest Ref Rng 02/04/2015 09/21/2014 01/23/2014  Glucose 70 - 99 mg/dL 96 70 87  BUN 6 - 23 mg/dL 14 9 10   Creatinine 0.40 - 1.20 mg/dL 8.29 1.1 1.1  Sodium 562 - 145 mEq/L 138 140 136  Potassium 3.5 - 5.1 mEq/L 3.9 3.7 3.8  Chloride 96 - 112 mEq/L 101 104 100  CO2 19 - 32 mEq/L 34(H) 29 30  Calcium 8.4 - 10.5 mg/dL 9.5 9.2 9.6  Total Protein 6.0 - 8.3 g/dL 7.3 7.2 7.7  Albumin 3.5 - 5.5 g/dL - - -  Total Bilirubin 0.2 - 1.2 mg/dL 0.3 0.3 0.5  Alkaline Phos 39 - 117 U/L 116 113 102  AST 0 - 37 U/L 21 26 23   ALT 0 - 35  U/L Impression:  Pulmonary sarcoidosis. Plan: - she will need repeat labs at next visit - continue methotrexate 4 pills weekly for now >> if her breathing is better with inhaler change, then will continue to wean MTX - contine bactrim for PCP prophylaxis while on MTX  COPD with chronic bronchitis. Plan: - will have her try dulera in place of breo - she might need to resume scheduled spiriva - flu shot today  Dyspnea on exertion >> likely from deconditioning in setting of COPD, sarcoidosis. Plan: - encouraged her to maintain a regular exercise regimen to maintain her stamina  [name], MD [facility] Pulmonary/Critical Care Pager:   [phone]

## 2015-07-15 ENCOUNTER — Other Ambulatory Visit: Payer: Self-pay

## 2015-07-15 MED ORDER — LEVOMILNACIPRAN HCL ER 40 MG PO CP24: ORAL_CAPSULE | ORAL | Status: DC

## 2015-07-15 NOTE — Telephone Encounter (Signed)
Patient called to give status update on fetizima, patient is tolerating well. Patient would like rx sent to [facility].  Dr.Reed was verbally informed of patients progress.  RX sent in.

## 2015-08-11 ENCOUNTER — Telehealth: Payer: Self-pay | Admitting: Pulmonary Disease

## 2015-08-11 MED ORDER — MOMETASONE FURO-FORMOTEROL FUM 100-5 MCG/ACT IN AERO: 2.0000 | INHALATION_SPRAY | Freq: Two times a day (BID) | RESPIRATORY_TRACT | Status: DC

## 2015-08-11 NOTE — Telephone Encounter (Signed)
lmtcb X1 for pt.   Samples of dulera left up front for pt.

## 2015-08-12 NOTE — Telephone Encounter (Signed)
Spoke with pt, aware that dulera samples have been placed up front for pt.  Nothing further needed.

## 2015-08-15 ENCOUNTER — Other Ambulatory Visit: Payer: Self-pay | Admitting: Pulmonary Disease

## 2015-10-28 ENCOUNTER — Other Ambulatory Visit: Payer: Self-pay | Admitting: Internal Medicine

## 2015-11-10 ENCOUNTER — Encounter: Payer: Self-pay | Admitting: Internal Medicine

## 2015-12-10 ENCOUNTER — Other Ambulatory Visit: Payer: Self-pay | Admitting: Internal Medicine

## 2015-12-10 ENCOUNTER — Other Ambulatory Visit: Payer: Self-pay | Admitting: Pulmonary Disease

## 2015-12-21 ENCOUNTER — Other Ambulatory Visit: Payer: Self-pay

## 2015-12-22 ENCOUNTER — Other Ambulatory Visit: Payer: Self-pay

## 2015-12-23 ENCOUNTER — Ambulatory Visit: Payer: Self-pay | Admitting: Internal Medicine

## 2015-12-23 ENCOUNTER — Encounter: Payer: Self-pay | Admitting: Internal Medicine

## 2015-12-28 ENCOUNTER — Encounter: Payer: Self-pay | Admitting: Pulmonary Disease

## 2015-12-28 ENCOUNTER — Ambulatory Visit (INDEPENDENT_AMBULATORY_CARE_PROVIDER_SITE_OTHER): Payer: Self-pay | Admitting: Pulmonary Disease

## 2015-12-28 ENCOUNTER — Other Ambulatory Visit (INDEPENDENT_AMBULATORY_CARE_PROVIDER_SITE_OTHER): Payer: Self-pay

## 2015-12-28 VITALS — BP 132/86 | HR 79 | Ht 66.5 in | Wt 285.0 lb

## 2015-12-28 DIAGNOSIS — J449 Chronic obstructive pulmonary disease, unspecified: Secondary | ICD-10-CM

## 2015-12-28 DIAGNOSIS — F17210 Nicotine dependence, cigarettes, uncomplicated: Secondary | ICD-10-CM

## 2015-12-28 DIAGNOSIS — D860 Sarcoidosis of lung: Secondary | ICD-10-CM

## 2015-12-28 DIAGNOSIS — Z720 Tobacco use: Secondary | ICD-10-CM

## 2015-12-28 LAB — COMPREHENSIVE METABOLIC PANEL
ALBUMIN: 3.7 g/dL (ref 3.5–5.2)
ALK PHOS: 102 U/L (ref 39–117)
ALT: 12 U/L (ref 0–35)
AST: 17 U/L (ref 0–37)
BUN: 14 mg/dL (ref 6–23)
CALCIUM: 9.5 mg/dL (ref 8.4–10.5)
CO2: 31 mEq/L (ref 19–32)
CREATININE: 1.08 mg/dL (ref 0.40–1.20)
Chloride: 102 mEq/L (ref 96–112)
GFR: 68.85 mL/min (ref >60.00)
Glucose, Bld: 89 mg/dL (ref 70–99)
Potassium: 4.3 mEq/L (ref 3.5–5.1)
Sodium: 137 mEq/L (ref 135–145)
Total Bilirubin: 0.3 mg/dL (ref 0.2–1.2)
Total Protein: 8 g/dL (ref 6.0–8.3)

## 2015-12-28 LAB — CBC
HCT: 39.2 % (ref 36.0–46.0)
Hemoglobin: 13 g/dL (ref 12.0–15.0)
MCHC: 33.2 g/dL (ref 30.0–36.0)
MCV: 91.5 fl (ref 78.0–100.0)
PLATELETS: 240 10*3/uL (ref 150.0–400.0)
RBC: 4.28 Mil/uL (ref 3.87–5.11)
RDW: 15.1 % (ref 11.5–15.5)
WBC: 6.9 10*3/uL (ref 4.0–10.5)

## 2015-12-28 NOTE — Progress Notes (Signed)
Current Outpatient Prescriptions on File Prior to Visit  Medication Sig  . acetaminophen (TYLENOL) 500 MG tablet Take 500 mg by mouth every 6 (six) hours as needed. For pain  . albuterol (PROVENTIL HFA;VENTOLIN HFA) 108 (90 BASE) MCG/ACT inhaler Inhale 2 puffs into the lungs every 4 (four) hours as needed. For shortness of breath  . BREO ELLIPTA 100-25 MCG/INH AEPB INHALE 1 PUFF BY MOUTH EVERY DAY  . buPROPion (WELLBUTRIN SR) 150 MG 12 hr tablet TAKE 1 TABLET(150 MG) BY MOUTH TWICE DAILY  . calcium carbonate (TUMS - DOSED IN MG ELEMENTAL CALCIUM) 500 MG chewable tablet Chew 1 tablet by mouth daily as needed. For indigestion  . cephALEXin (KEFLEX) 500 MG capsule Take  BID QOD-(Tues, thurs, sat, and sun)  . Cholecalciferol (VITAMIN D-3 PO) Take 5 capsules by mouth 2 (two) times a week.  . clindamycin (CLEOCIN T) 1 % lotion Apply 1 application topically as needed.   . Eflornithine HCl (VANIQA) 13.9 % cream Apply topically 2 (two) times daily as needed.   . folic acid (FOLVITE) 1 MG tablet TAKE 1 TABLET BY MOUTH EVERY DAY-- except on days when taking Methotrexate  . ipratropium-albuterol (DUONEB) 0.5-2.5 (3) MG/3ML SOLN Take 3 mLs by nebulization 2 (two) times daily as needed. For shortness of breath  . Levomilnacipran HCl ER (FETZIMA) 40 MG CP24 Take by mouth daily.  Marland Kitchen losartan-hydrochlorothiazide (HYZAAR) 100-12.5 MG per tablet TAKE ONE TABLET BY MOUTH EVERY DAY  . magnesium citrate SOLN Take 296 mLs (1 Bottle total) by mouth daily as needed for severe constipation. For constipation  . magnesium oxide (MAG-OX) 400 MG tablet Take 400 mg by mouth daily.  . methotrexate (RHEUMATREX) 2.5 MG tablet Take 4 tablets (10 mg total) by mouth once a week.  . mometasone-formoterol (DULERA) 100-5 MCG/ACT AERO Inhale 2 puffs into the lungs 2 (two) times daily.  . montelukast (SINGULAIR) 10 MG tablet TAKE ONE TABLET BY MOUTH EVERY DAY AS NEEDED FOR allergies (Patient taking differently: TAKE ONE TABLET BY  MOUTH EVERY DAY)  . phentermine 37.5 MG capsule Take 1/2 tablet BID  . pravastatin (PRAVACHOL) 40 MG tablet TAKE 1 TABLET BY MOUTH EVERY DAY  . pyridOXINE (VITAMIN B-6) 50 MG tablet Take 50 mg by mouth daily.  Marland Kitchen sulfamethoxazole-trimethoprim (BACTRIM,SEPTRA) 400-80 MG tablet TAKE ONE TABLET BY MOUTH THREE TIMES A WEEK  . traMADol (ULTRAM) 50 MG tablet Take 1 tablet (50 mg total) by mouth every 6 (six) hours as needed for severe pain.  . vitamin B-12 (CYANOCOBALAMIN) 100 MCG tablet Take 100 mcg by mouth daily.  Marland Kitchen zinc gluconate 50 MG tablet Take 50 mg by mouth daily.   No current facility-administered medications on file prior to visit.    Chief Complaint  Patient presents with  . Follow-up    Pt reports increased cough and congestion d/t smoking again. Pt states that she is using vapor (no nicotine cartridges).     Tests/Events EBUS with Tbx 03/01/12>>Streptococcus pneumoniae in BAL, cytology/Tbx negative 04/30/12 start prednisone Labs 04/29/13 >> Hep B S Ag negative, Hep B S Ab reactive, Hep B core Ab negative, Hep B core IgM negative, HCV Ab negative 04/30/13 Start MTX 11/30/13 Off prednisone   CT chest CT chest 01/23/12>>bulky mediastinal/hilar LAN.  Scattered ill-defined upper lobe predominate perilymphatic and centrilobular nodules. CT chest 04/30/12>>No change in the appearance of the mediastinal and hilar adenopathy and pulmonary nodularity CT chest 07/31/12>>b/l hilar adenopathy no change, b/l upper lobe subpleural nodularity, improved LUL GGO CT chest  04/14/13 >> peribronchovascular nodularity b/l upper lobes Rt > Lt mildly progressed, no change to mediastinal/hilar adenopathy CT chest 01/05/14 >> upper lobe predominant prebronchovascular reticulo-nodule opacities, 1.2 cm Rt paratracheal LAN, 1.6 cm subcarinal LAN, 1.7 cm Rt hilar LAN  CT chest 10/07/14 >> decreased LAN  Pulmonary function testing PFT 03/13/12>>FEV1 1.65 (57%), FEV1% 58, TLC 4.44 (79%), DLCO 62%, no BD PFT  03/18/13 >> FEV1 1.78 (69%), FEV1% 67, TLC 4.04 (75%), DLCO 92%, no BD PFT 01/13/14 >> FEV1 1.64 (70%), FEV1% 70, TLC 3.69 (68%), DLCO 79%  Past medical history HLD, Anxiety, Depression, Hidradenitis  PSHx, Medications, Allergies, Fhx, Shx reviewed.  Vital signs BP 132/86 mmHg  Pulse 79  Ht 5' 6.5" (1.689 m)  Wt 285 lb (129.275 kg)  BMI 45.32 kg/m2  SpO2 99%  History of Present Illness: Kaitlyn [name] is a 50 y.o. female smoker with sarcoidosis, and COPD.  She started smoking again.  She noticed her breathing got worse once she started smoking again.  She gets occasional cough, wheeze, and chest congestion.  She will get short of breath with exertion, but is able to keep up with routine activities.    She is no longer using spiriva or breo.  She has been using dulera and singulair.  She is using methotrexate 4 pills weekly.  She denies fever, hemoptysis, skin rash, or gland swelling.   Physical Exam:  General - Obese  HEENT - No sinus tenderness, MP 3, no oral exudate, no LAN Cardiac - s1s2 regular, no murmur  Chest - no wheeze/rales, no dullness Abdomen - soft, + bowel sounds Extremities - no edema Neurologic - normal strength Skin - no rashes  Psychiatric - normal mood, behavior   Impression:  Pulmonary sarcoidosis. Plan: - repeat labs today - will start to gradually wean her off of methotrexate as tolerated - she can stop bactrim once she is off methotrexate - ideally she would need follow up CT chest and PFT >> she does not have insurance; she will check whether she can get discounted rate for testing   COPD with chronic bronchitis. Plan: - continue dulera, singulair - continue prn duoneb  Dyspnea on exertion >> likely from deconditioning in setting of COPD, sarcoidosis. Plan: - encouraged her to maintain a regular exercise regimen to maintain her stamina  Tobacco abuse. Plan: - emphasized the absolute need to not smoke - she will continue using  vapor   Patient Instructions  Decrease methotrexate by 1 pill every two weeks until you are no longer taking methotrexate  Can stop taking bactrim once you are no longer taking methotrexate  Lab tests today  Follow up in 3 months     [name], MD [facility] Pulmonary/Critical Care Pager:  [phone]

## 2015-12-28 NOTE — Patient Instructions (Addendum)
Decrease methotrexate by 1 pill every two weeks until you are no longer taking methotrexate  Can stop taking bactrim once you are no longer taking methotrexate  Lab tests today  Follow up in 3 months

## 2015-12-29 ENCOUNTER — Telehealth: Payer: Self-pay | Admitting: Pulmonary Disease

## 2015-12-29 NOTE — Telephone Encounter (Signed)
Results have been explained to patient, pt expressed understanding. Nothing further needed.  

## 2015-12-29 NOTE — Telephone Encounter (Signed)
CMP Latest Ref Rng 12/28/2015 02/04/2015 09/21/2014  Glucose 70 - 99 mg/dL 89 96 70  BUN 6 - 23 mg/dL Creatinine 0.40 - 1.20 mg/dL 1.61 0.96 1.1  Sodium 045 - 145 mEq/L 137 138 140  Potassium 3.5 - 5.1 mEq/L 4.3 3.9 3.7  Chloride 96 - 112 mEq/L 102 101 104  CO2 19 - 32 mEq/L 31 34(H) 29  Calcium 8.4 - 10.5 mg/dL 9.5 9.5 9.2  Total Protein 6.0 - 8.3 g/dL 8.0 7.3 7.2  Total Bilirubin 0.2 - 1.2 mg/dL 0.3 0.3 0.3  Alkaline Phos 39 - 117 U/L 102 116 113  AST 0 - 37 U/L ALT 0 - 35 U/L CBC Latest Ref Rng 12/28/2015 02/04/2015 09/21/2014  WBC 4.0 - 10.5 K/uL 6.9 8.2 6.5  Hemoglobin 12.0 - 15.0 g/dL 40.9 81.1 91.4  Hematocrit 36.0 - 46.0 % 39.2 36.4 36.9  Platelets 150.0 - 400.0 K/uL 240.0 253.0 242.0    Will have my nurse inform pt that lab tests were normal.

## 2016-01-24 ENCOUNTER — Ambulatory Visit: Payer: Self-pay | Admitting: Internal Medicine

## 2016-02-02 ENCOUNTER — Other Ambulatory Visit: Payer: Self-pay

## 2016-02-06 ENCOUNTER — Other Ambulatory Visit: Payer: Self-pay | Admitting: Internal Medicine

## 2016-02-07 ENCOUNTER — Ambulatory Visit: Payer: Self-pay | Admitting: Internal Medicine

## 2016-02-25 ENCOUNTER — Other Ambulatory Visit: Payer: Self-pay

## 2016-02-26 LAB — CBC WITH DIFFERENTIAL/PLATELET
Basophils Absolute: 0 10*3/uL (ref 0.0–0.2)
Basos: 0 %
EOS (ABSOLUTE): 0.1 10*3/uL (ref 0.0–0.4)
Eos: 2 %
Hematocrit: 43 % (ref 34.0–46.6)
Hemoglobin: 13.9 g/dL (ref 11.1–15.9)
Immature Grans (Abs): 0 10*3/uL (ref 0.0–0.1)
Immature Granulocytes: 0 %
Lymphocytes Absolute: 1.1 10*3/uL (ref 0.7–3.1)
Lymphs: 21 %
MCH: 30.2 pg (ref 26.6–33.0)
MCHC: 32.3 g/dL (ref 31.5–35.7)
MCV: 94 fL (ref 79–97)
Monocytes Absolute: 0.5 10*3/uL (ref 0.1–0.9)
Monocytes: 9 %
Neutrophils Absolute: 3.6 10*3/uL (ref 1.4–7.0)
Neutrophils: 68 %
Platelets: 237 10*3/uL (ref 150–379)
RBC: 4.6 x10E6/uL (ref 3.77–5.28)
RDW: 14.6 % (ref 12.3–15.4)
WBC: 5.3 10*3/uL (ref 3.4–10.8)

## 2016-02-26 LAB — LIPID PANEL
Chol/HDL Ratio: 3.1 ratio units (ref 0.0–4.4)
Cholesterol, Total: 168 mg/dL (ref 100–199)
HDL: 54 mg/dL (ref >39)
LDL Calculated: 99 mg/dL (ref 0–99)
Triglycerides: 76 mg/dL (ref 0–149)
VLDL Cholesterol Cal: 15 mg/dL (ref 5–40)

## 2016-02-26 LAB — COMPREHENSIVE METABOLIC PANEL
ALT: 18 IU/L (ref 0–32)
AST: 26 IU/L (ref 0–40)
Albumin/Globulin Ratio: 1 — ABNORMAL LOW (ref 1.2–2.2)
Albumin: 3.8 g/dL (ref 3.5–5.5)
Alkaline Phosphatase: 122 IU/L — ABNORMAL HIGH (ref 39–117)
BUN/Creatinine Ratio: 13 (ref 9–23)
BUN: 14 mg/dL (ref 6–24)
Bilirubin Total: 0.3 mg/dL (ref 0.0–1.2)
CO2: 26 mmol/L (ref 18–29)
Calcium: 9.6 mg/dL (ref 8.7–10.2)
Chloride: 98 mmol/L (ref 96–106)
Creatinine, Ser: 1.11 mg/dL — ABNORMAL HIGH (ref 0.57–1.00)
GFR calc Af Amer: 67 mL/min/{1.73_m2} (ref >59)
GFR calc non Af Amer: 58 mL/min/{1.73_m2} — ABNORMAL LOW (ref >59)
Globulin, Total: 3.8 g/dL (ref 1.5–4.5)
Glucose: 83 mg/dL (ref 65–99)
Potassium: 4.5 mmol/L (ref 3.5–5.2)
Sodium: 140 mmol/L (ref 134–144)
Total Protein: 7.6 g/dL (ref 6.0–8.5)

## 2016-02-26 LAB — HEMOGLOBIN A1C
Est. average glucose Bld gHb Est-mCnc: 126 mg/dL
Hgb A1c MFr Bld: 6 % — ABNORMAL HIGH (ref 4.8–5.6)

## 2016-02-28 ENCOUNTER — Encounter: Payer: Self-pay | Admitting: Internal Medicine

## 2016-02-28 ENCOUNTER — Other Ambulatory Visit: Payer: Self-pay | Admitting: Internal Medicine

## 2016-02-28 ENCOUNTER — Encounter: Payer: Self-pay | Admitting: *Deleted

## 2016-02-28 ENCOUNTER — Ambulatory Visit (INDEPENDENT_AMBULATORY_CARE_PROVIDER_SITE_OTHER): Payer: Self-pay | Admitting: Internal Medicine

## 2016-02-28 VITALS — BP 140/80 | HR 92 | Temp 97.9°F | Wt 280.0 lb

## 2016-02-28 DIAGNOSIS — J449 Chronic obstructive pulmonary disease, unspecified: Secondary | ICD-10-CM

## 2016-02-28 DIAGNOSIS — I10 Essential (primary) hypertension: Secondary | ICD-10-CM | POA: Insufficient documentation

## 2016-02-28 DIAGNOSIS — F3341 Major depressive disorder, recurrent, in partial remission: Secondary | ICD-10-CM | POA: Insufficient documentation

## 2016-02-28 DIAGNOSIS — D860 Sarcoidosis of lung: Secondary | ICD-10-CM

## 2016-02-28 DIAGNOSIS — J4489 Other specified chronic obstructive pulmonary disease: Secondary | ICD-10-CM

## 2016-02-28 DIAGNOSIS — Z716 Tobacco abuse counseling: Secondary | ICD-10-CM

## 2016-02-28 MED ORDER — SERTRALINE HCL 25 MG PO TABS: 25.0000 mg | ORAL_TABLET | Freq: Every day | ORAL | Status: DC

## 2016-02-28 NOTE — Patient Instructions (Signed)
Continue to work on quitting smoking. Also increase your exercise to help lose weight.   It's time for a colonoscopy when you can afford this.   Let's try zoloft 25mg .

## 2016-02-28 NOTE — Progress Notes (Signed)
Patient ID: Kaitlyn [name], female   DOB: [birth year], 50 y.o.   MRN: [id number]   Location:  [facility] Provider:  Jann Ra L. Renato Gails, D.O., C.M.D.  Code Status: full code Goals of Care:  Advanced Directives 02/28/2016  Does patient have an advance directive? No  Would patient like information on creating an advanced directive? -   Chief Complaint  Patient presents with  . Medical Management of Chronic Issues    6 mth follow-up    HPI: Patient is a 50 y.o. female seen today for medical management of chronic diseases.    Phlegm and sob have developed since off methotrexate and then off bactrim since.  She's going to call Dr. Craige Cotta back.  Says she had been trying to exercise.  Weight up and down.    Depression screen [facility] 2/9 02/28/2016 06/24/2015 10/06/2013  Decreased Interest 1 0 0  Down, Depressed, Hopeless 1 0 0  PHQ - 2 Score 2 0 0  Altered sleeping 0 - -  Tired, decreased energy 1 - -  Change in appetite 0 - -  Feeling bad or failure about yourself  1 - -  Trouble concentrating 0 - -  Moving slowly or fidgety/restless 0 - -  Suicidal thoughts 0 - -  PHQ-9 Score 4 - -  antidepressant was too expensive that I gave her so she's using a card from [employer] b/c she does not have insurance now.    Did start smoking a little, but down to e-cigarettes again which helped her quit before.  Says her boyfriend has no addictions and wants her to quit.  Travels to visit him.  Her brother is helping to take care of mom.  She is going to make an appt for her mom.    Reports various pains:  Left chest when lying on left side only--gets better moving to right.  Also some in right flank when in neck.    Left heel spur still bothersome at times also.  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     Has increased fruit intake and yogurts.  Jello.  hba1c and LDL trending.    Discussed need for cscope at 50 yo.  Wants to put off due to her not  having insurance. Has constipation, but not bleeding.    Past Medical History  Diagnosis Date  . Hyperlipidemia     takes pravachol  . Obesity   . Anxiety   . Hidradenitis   . Osteoarthritis   . PONV (postoperative nausea and vomiting)   . Asthma     inhalers   . Shortness of breath     exertion  . 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    PFT 03/13/12>>FEV1 1.65 (57%), FEV1% 58, TLC 4.44 (79%), DLCO 62%, no BD   . Pulmonary sarcoidosis (HCC) 02/22/2012    PFT 03/13/12>>FEV1 1.65 (57%), FEV1% 58, TLC 4.44 (79%), DLCO 62%, no BD EBUS with Tbx 03/01/12>>Streptococcus pneumoniae in BAL, cytology/Tbx negative Likely sarcoidosis>>start prednisone 04/30/12   . Sarcoidosis (HCC)   . Keloid scar   . Extrinsic asthma, unspecified   . Osteoarthrosis, unspecified whether generalized or localized, unspecified site   . Carpal tunnel syndrome 01/2015    right  . Hypertension   . Depression   . Wears dentures     top    Past Surgical History  Procedure Laterality Date  . Other surgical history  2010    cyst removal from right middle finger    . Multiple tooth extractions  2010  . Removal of growth from finger      DR MEYERDIERKS  . Video bronchoscopy with endobronchial ultrasound  03/01/2012  . Open reduction nasal fracture  06/29/2009    with exc. bx. left intranasal papilloma  . Nasal turbinate reduction Bilateral 06/29/2009    inferior    Allergies  Allergen Reactions  . Codeine Other (See Comments)    Reaction unknown  . Cyclinex [Tetracycline Hcl] Other (See Comments)    Reaction unknown, all "cyclines"  . Milk Thistle Hives  . Morphine And Related Other (See Comments)    Reaction unknown      Medication List       This list is accurate as of: 02/28/16 10:23 AM.  Always use your most recent med list.               acetaminophen 500 MG tablet  Commonly known as:  TYLENOL  Take 500 mg by mouth every 6 (six) hours as needed. For pain     albuterol 108 (90 Base)  MCG/ACT inhaler  Commonly known as:  PROVENTIL HFA;VENTOLIN HFA  Inhale 2 puffs into the lungs every 4 (four) hours as needed. For shortness of breath     buPROPion 150 MG 12 hr tablet  Commonly known as:  WELLBUTRIN SR  TAKE 1 TABLET(150 MG) BY MOUTH TWICE DAILY     calcium carbonate 500 MG chewable tablet  Commonly known as:  TUMS - dosed in mg elemental calcium  Chew 1 tablet by mouth daily as needed. For indigestion     cephALEXin 500 MG capsule  Commonly known as:  KEFLEX  Take 500mg  BID QOD-(Tues, thurs, sat, and sun)     clindamycin 1 % lotion  Commonly known as:  CLEOCIN T  Apply 1 application topically as needed.     folic acid 1 MG tablet  Commonly known as:  FOLVITE  TAKE 1 TABLET BY MOUTH EVERY DAY-- except on days when taking Methotrexate     ipratropium-albuterol 0.5-2.5 (3) MG/3ML Soln  Commonly known as:  DUONEB  Take 3 mLs by nebulization 2 (two) times daily as needed. For shortness of breath     losartan-hydrochlorothiazide 100-12.5 MG tablet  Commonly known as:  HYZAAR  TAKE ONE TABLET BY MOUTH EVERY DAY     magnesium oxide 400 MG tablet  Commonly known as:  MAG-OX  Take 400 mg by mouth daily.     methotrexate 2.5 MG tablet  Commonly known as:  RHEUMATREX  Take 4 tablets (10 mg total) by mouth once a week.     mometasone-formoterol 100-5 MCG/ACT Aero  Commonly known as:  DULERA  Inhale 2 puffs into the lungs 2 (two) times daily.     montelukast 10 MG tablet  Commonly known as:  SINGULAIR  TAKE ONE TABLET BY MOUTH EVERY DAY AS NEEDED FOR ALLERGIES     phentermine 37.5 MG tablet  Commonly known as:  ADIPEX-P  TAKE 1/2 TABLET BY MOUTH TWICE DAILY     pravastatin 40 MG tablet  Commonly known as:  PRAVACHOL  TAKE 1 TABLET BY MOUTH EVERY DAY     pyridOXINE 50 MG tablet  Commonly known as:  VITAMIN B-6  Take 50 mg by mouth daily.     sulfamethoxazole-trimethoprim 400-80 MG tablet  Commonly known as:  BACTRIM,SEPTRA  TAKE ONE TABLET BY MOUTH  THREE TIMES A  WEEK     traMADol 50 MG tablet  Commonly known as:  ULTRAM  Take 1 tablet (50 mg total) by mouth every 6 (six) hours as needed for severe pain.     TURMERIC PO  Take 538 mg by mouth daily.     VANIQA 13.9 % cream  Generic drug:  Eflornithine HCl  Apply topically 2 (two) times daily as needed.     vitamin B-12 100 MCG tablet  Commonly known as:  CYANOCOBALAMIN  Take 100 mcg by mouth daily.     VITAMIN D-3 PO  Take 5 capsules by mouth 2 (two) times a week.     zinc gluconate 50 MG tablet  Take 50 mg by mouth daily.        Review of Systems:  Review of Systems  Constitutional: Negative for fever, chills, weight loss and malaise/fatigue.  HENT: Negative for congestion and hearing loss.   Eyes: Negative for blurred vision.  Respiratory: Positive for cough, sputum production and shortness of breath. Negative for wheezing.   Cardiovascular: Negative for chest pain, palpitations and leg swelling.  Gastrointestinal: Positive for constipation. Negative for abdominal pain, blood in stool and melena.  Genitourinary: Negative for dysuria, urgency and frequency.  Musculoskeletal: Positive for myalgias and joint pain. Negative for falls.  Skin: Negative for rash.  Neurological: Negative for dizziness, loss of consciousness and weakness.  Endo/Heme/Allergies: Does not bruise/bleed easily.  Psychiatric/Behavioral: Positive for depression. Negative for memory loss. The patient is not nervous/anxious and does not have insomnia.     Health Maintenance  Topic Date Due  . HIV Screening  03/19/1980  . TETANUS/TDAP  03/19/1984  . PAP SMEAR  03/19/1986  . COLONOSCOPY  03/20/2015  . INFLUENZA VACCINE  05/30/2016  . MAMMOGRAM  01/28/2017    Physical Exam: Filed Vitals:   02/28/16 1015  BP: 140/80  Pulse: 92  Temp: 97.9 F (36.6 C)  TempSrc: Oral  Weight: 280 lb (127.007 kg)  SpO2: 97%   Body mass index is 44.52 kg/(m^2). Physical Exam  Constitutional: She is  oriented to person, place, and time. She appears well-developed and well-nourished. No distress.  Obese black female  HENT:  Head: Normocephalic and atraumatic.  Cardiovascular: Normal rate, regular rhythm, normal heart sounds and intact distal pulses.   Pulmonary/Chest: Effort normal.  Upper airway wheezing, but lungs clear  Abdominal: Bowel sounds are normal.  Musculoskeletal: Normal range of motion. She exhibits no edema or tenderness.  Neurological: She is alert and oriented to person, place, and time.  Skin: Skin is warm and dry.    Labs reviewed: Basic Metabolic Panel:  Recent Labs  11/91/4702/28/17 1716 02/25/16 0929  NA 137 140  K 4.3 4.5  CL 102 98  CO2 31 26  GLUCOSE 89 83  BUN 14 14  CREATININE 1.08 1.11*  CALCIUM 9.5 9.6   Liver Function Tests:  Recent Labs  12/28/15 1716 02/25/16 0929  AST 17 26  ALT 12 18  ALKPHOS 102 122*  BILITOT 0.3 0.3  PROT 8.0 7.6  ALBUMIN 3.7 3.8   No results for input(s): LIPASE, AMYLASE in the last 8760 hours. No results for input(s): AMMONIA in the last 8760 hours. CBC:  Recent Labs  12/28/15 1716 02/25/16 0929  WBC 6.9 5.3  NEUTROABS  --  3.6  HGB 13.0  --   HCT 39.2 43.0  MCV 91.5 94  PLT 240.0 237   Lipid Panel:  Recent Labs  02/25/16 0929  CHOL 168  HDL 54  LDLCALC 99  TRIG 76  CHOLHDL 3.1   Lab Results  Component Value Date   HGBA1C 6.0* 02/25/2016  Dr. Evlyn Courier notes reviewed.  Assessment/Plan 1. Recurrent major depressive disorder, in partial remission (HCC) - 4/9 on phq-9 and already on wellbutrin which she says helps decrease her smoking craving, but she has still been smoking when out with a friend who smokes - sertraline (ZOLOFT) 25 MG tablet; Take 1 tablet (25 mg total) by mouth daily.  Dispense: 30 tablet; Refill: 3  2. Morbid obesity due to excess calories (HCC) -ongoing, having difficulty losing weight -is back on phentermine again and trying to follow a healthier diet; but still was not  exercising--after reviewing labs with worsening hyperglycemia and LDL, she knows she needs to and did get her equipment back out--encouraged this -requests TSH be drawn  3. Pulmonary sarcoidosis (HCC) -has tapered off methotrexate and then bactrim, but now feels she is more sob and coughing up sputum which she was not while on the bactrim and lower dose of methotrexate (sounds more like copd from smoking to me)  4. COPD with chronic bronchitis (HCC) -seems this is what's worse and unrelated to her sarcoid, but she's going to check with Dr.Sood's office  5. Tobacco abuse counseling -again counseled on quitting smoking--is back and forth about whether she is or is not smoking or on e-cigarettes w/o nicotine -has worsened #4 and decreased exercise tolerance which is much needed at this time  6.  HTN:  bp elevated today, has not been historically -due to smoking, not exercising and gaining weight -counseled on diet and exercise changes and quitting smoking--says she is going to make these changes  Labs/tests ordered: add TSH to previously obtained labs from 4/28   Next appt:  6 mos for annual   Kaitlyn [name] L. Isidora Laham, D.O. Geriatrics [facility] [address], [place] [address] Cell Phone (Mon-Fri 8am-5pm):  [phone] On Call:  [phone] & follow prompts after 5pm & weekends Office Phone:  [phone] Office Fax:  [phone]

## 2016-03-01 LAB — TSH: TSH: 1.15 u[IU]/mL (ref 0.450–4.500)

## 2016-03-01 LAB — SPECIMEN STATUS REPORT

## 2016-03-02 ENCOUNTER — Encounter: Payer: Self-pay | Admitting: *Deleted

## 2016-03-14 ENCOUNTER — Other Ambulatory Visit: Payer: Self-pay | Admitting: Internal Medicine

## 2016-03-14 ENCOUNTER — Other Ambulatory Visit: Payer: Self-pay | Admitting: *Deleted

## 2016-03-14 MED ORDER — PHENTERMINE HCL 37.5 MG PO TABS: 18.7500 mg | ORAL_TABLET | Freq: Two times a day (BID) | ORAL | Status: DC

## 2016-03-15 ENCOUNTER — Other Ambulatory Visit: Payer: Self-pay

## 2016-03-15 MED ORDER — BUPROPION HCL ER (SR) 150 MG PO TB12: ORAL_TABLET | ORAL | Status: DC

## 2016-03-15 NOTE — Telephone Encounter (Signed)
Prescription sent to pharmacy electronically.  [facility] [address] [place] [address] Phone: [phone] Fax: [phone]

## 2016-04-10 ENCOUNTER — Telehealth: Payer: Self-pay | Admitting: Pulmonary Disease

## 2016-04-10 DIAGNOSIS — D860 Sarcoidosis of lung: Secondary | ICD-10-CM

## 2016-04-10 NOTE — Telephone Encounter (Signed)
Spoke with pt. She is now able to afford to have a CT. Pt had an appointment with [facility] on 04/11/16 but she had be reschedule this. States that she has been having to play around with her methotrexate dosage but thinks she has her symptoms under control at this time.  VS - please advise what kind of CT pt needs.

## 2016-04-10 NOTE — Telephone Encounter (Signed)
Please schedule CT chest with IV contrast to assess pulmonary sarcoidosis.

## 2016-04-10 NOTE — Telephone Encounter (Signed)
CT order has been placed. Pt is aware. Nothing further was needed.

## 2016-04-11 ENCOUNTER — Ambulatory Visit: Payer: Self-pay | Admitting: Pulmonary Disease

## 2016-04-18 ENCOUNTER — Inpatient Hospital Stay: Admission: RE | Admit: 2016-04-18 | Payer: Self-pay | Source: Ambulatory Visit

## 2016-04-23 ENCOUNTER — Other Ambulatory Visit: Payer: Self-pay | Admitting: Pulmonary Disease

## 2016-04-26 ENCOUNTER — Ambulatory Visit (INDEPENDENT_AMBULATORY_CARE_PROVIDER_SITE_OTHER)
Admission: RE | Admit: 2016-04-26 | Discharge: 2016-04-26 | Disposition: A | Discharge status: Home / Self Care | Payer: Self-pay | Source: Ambulatory Visit | Attending: Pulmonary Disease | Admitting: Pulmonary Disease

## 2016-04-26 DIAGNOSIS — D860 Sarcoidosis of lung: Secondary | ICD-10-CM

## 2016-04-26 MED ORDER — IOPAMIDOL (ISOVUE-300) INJECTION 61%
80.0000 mL | Freq: Once | INTRAVENOUS | Status: AC | PRN
  Administered 2016-04-26: 80 mL via INTRAVENOUS

## 2016-05-01 ENCOUNTER — Other Ambulatory Visit: Payer: Self-pay | Admitting: Internal Medicine

## 2016-05-01 ENCOUNTER — Telehealth: Payer: Self-pay | Admitting: Pulmonary Disease

## 2016-05-01 NOTE — Telephone Encounter (Signed)
CT does show mild progression of Sarcoid  Will need to discuss in detail with Dr. Craige CottaSood  On return ov planned next month.  rec cont current regimen  Stop smoking if still smoking  May need PFT on return  Now off Methotrexate , may need to discuss with Dr. Craige CottaSood  [facility] needs to [facility]  Please contact office for sooner follow up if symptoms do not improve or worsen or seek emergency care

## 2016-05-01 NOTE — Telephone Encounter (Signed)
Pt had CT scan done 04/26/16 by Dr. Craige CottaSood. Pt is requesting results.  VS is on vacation Please advise TP thanks

## 2016-05-01 NOTE — Telephone Encounter (Signed)
atc pt X2, line rang to fast busy signal.  Wcb.  

## 2016-05-03 ENCOUNTER — Other Ambulatory Visit: Payer: Self-pay | Admitting: Pulmonary Disease

## 2016-05-03 NOTE — Telephone Encounter (Signed)
Spoke with pt. She is aware of results. Nothing further was needed at this time.

## 2016-05-04 ENCOUNTER — Other Ambulatory Visit: Payer: Self-pay | Admitting: Internal Medicine

## 2016-05-04 ENCOUNTER — Telehealth: Payer: Self-pay | Admitting: Pulmonary Disease

## 2016-05-04 NOTE — Telephone Encounter (Signed)
Spoke with pt. She is aware that we do not have samples at this time. Nothing further was needed at this time.

## 2016-05-04 NOTE — Telephone Encounter (Signed)
We do not have samples of Dulera 100. lmtcb x1 for pt.

## 2016-05-30 ENCOUNTER — Telehealth: Payer: Self-pay | Admitting: Pulmonary Disease

## 2016-05-30 MED ORDER — MOMETASONE FURO-FORMOTEROL FUM 100-5 MCG/ACT IN AERO: 2.0000 | INHALATION_SPRAY | Freq: Two times a day (BID) | RESPIRATORY_TRACT | 0 refills | Status: DC

## 2016-05-30 NOTE — Telephone Encounter (Signed)
Called and spoke with pt and she is aware of 1 sample that has been left up front for her to pick up along with a free trial offer coupon.  Nothing further is needed.

## 2016-06-10 ENCOUNTER — Other Ambulatory Visit: Payer: Self-pay | Admitting: Internal Medicine

## 2016-06-12 ENCOUNTER — Other Ambulatory Visit: Payer: Self-pay

## 2016-06-12 MED ORDER — PHENTERMINE HCL 37.5 MG PO TABS: 18.7500 mg | ORAL_TABLET | Freq: Two times a day (BID) | ORAL | 0 refills | Status: DC

## 2016-06-21 ENCOUNTER — Encounter: Payer: Self-pay | Admitting: Pulmonary Disease

## 2016-06-21 ENCOUNTER — Ambulatory Visit (INDEPENDENT_AMBULATORY_CARE_PROVIDER_SITE_OTHER): Payer: Self-pay | Admitting: Pulmonary Disease

## 2016-06-21 VITALS — BP 138/86 | HR 93 | Ht 67.0 in | Wt 277.4 lb

## 2016-06-21 DIAGNOSIS — D860 Sarcoidosis of lung: Secondary | ICD-10-CM

## 2016-06-21 DIAGNOSIS — J449 Chronic obstructive pulmonary disease, unspecified: Secondary | ICD-10-CM

## 2016-06-21 DIAGNOSIS — Z720 Tobacco use: Secondary | ICD-10-CM

## 2016-06-21 NOTE — Patient Instructions (Signed)
Follow up in 2 months with Dr. Taylin Mans or Nurse Practitioner 

## 2016-06-21 NOTE — Progress Notes (Signed)
Current Outpatient Prescriptions on File Prior to Visit  Medication Sig  . acetaminophen (TYLENOL) 500 MG tablet Take 500 mg by mouth every 6 (six) hours as needed. For pain  . albuterol (PROVENTIL HFA;VENTOLIN HFA) 108 (90 BASE) MCG/ACT inhaler Inhale 2 puffs into the lungs every 4 (four) hours as needed. For shortness of breath  . buPROPion (WELLBUTRIN SR) 150 MG 12 hr tablet Take 1 tablet (150 mg total) by mouth 2 (two) times daily. Patients needs to schedule appointment  . calcium carbonate (TUMS - DOSED IN MG ELEMENTAL CALCIUM) 500 MG chewable tablet Chew 1 tablet by mouth daily as needed. For indigestion  . cephALEXin (KEFLEX) 500 MG capsule Take 500mg  BID QOD-(Tues, thurs, sat, and sun)  . Cholecalciferol (VITAMIN D-3 PO) Take 5 capsules by mouth 2 (two) times a week.  . clindamycin (CLEOCIN T) 1 % lotion Apply 1 application topically as needed.   . Eflornithine HCl (VANIQA) 13.9 % cream Apply topically 2 (two) times daily as needed.   . folic acid (FOLVITE) 1 MG tablet TAKE 1 TABLET BY MOUTH EVERY DAY  . ipratropium-albuterol (DUONEB) 0.5-2.5 (3) MG/3ML SOLN Take 3 mLs by nebulization 2 (two) times daily as needed. For shortness of breath  . losartan-hydrochlorothiazide (HYZAAR) 100-12.5 MG tablet TAKE 1 TABLET BY MOUTH DAILY  . magnesium oxide (MAG-OX) 400 MG tablet Take 400 mg by mouth daily.  . methotrexate (RHEUMATREX) 2.5 MG tablet TAKE 4 TABLETS(10 MG) BY MOUTH 1 TIME A WEEK  . mometasone-formoterol (DULERA) 100-5 MCG/ACT AERO Inhale 2 puffs into the lungs 2 (two) times daily.  . montelukast (SINGULAIR) 10 MG tablet TAKE ONE TABLET BY MOUTH EVERY DAY AS NEEDED FOR ALLERGIES  . phentermine (ADIPEX-P) 37.5 MG tablet Take 0.5 tablets (18.75 mg total) by mouth 2 (two) times daily.  . pravastatin (PRAVACHOL) 40 MG tablet TAKE 1 TABLET BY MOUTH EVERY DAY  . pyridOXINE (VITAMIN B-6) 50 MG tablet Take 50 mg by mouth daily.  . sertraline (ZOLOFT) 25 MG tablet Take 1 tablet (25 mg total) by  mouth daily.  Marland Kitchen. sulfamethoxazole-trimethoprim (BACTRIM,SEPTRA) 400-80 MG tablet TAKE ONE TABLET BY MOUTH THREE TIMES A WEEK  . traMADol (ULTRAM) 50 MG tablet Take 1 tablet (50 mg total) by mouth every 6 (six) hours as needed for severe pain.  . TURMERIC PO Take 538 mg by mouth daily.  . vitamin B-12 (CYANOCOBALAMIN) 100 MCG tablet Take 100 mcg by mouth daily.  Marland Kitchen. zinc gluconate 50 MG tablet Take 50 mg by mouth daily.   No current facility-administered medications on file prior to visit.     Chief Complaint  Patient presents with  . Follow-up    31mo rov. breathing has worsen. pt c/o increased sob since lowering dose of methotrexate.     Tests/Events EBUS with Tbx 03/01/12>>Streptococcus pneumoniae in BAL, cytology/Tbx negative 04/30/12 start prednisone Labs 04/29/13 >> Hep B S Ag negative, Hep B S Ab reactive, Hep B core Ab negative, Hep B core IgM negative, HCV Ab negative 04/30/13 Start MTX 11/30/13 Off prednisone  CT chest CT chest 01/23/12>>bulky mediastinal/hilar LAN.  Scattered ill-defined upper lobe predominate perilymphatic and centrilobular nodules. CT chest 04/30/12>>No change in the appearance of the mediastinal and hilar adenopathy and pulmonary nodularity CT chest 07/31/12>>b/l hilar adenopathy no change, b/l upper lobe subpleural nodularity, improved LUL GGO CT chest 04/14/13 >> peribronchovascular nodularity b/l upper lobes Rt > Lt mildly progressed, no change to mediastinal/hilar adenopathy CT chest 01/05/14 >> upper lobe predominant prebronchovascular reticulo-nodule opacities, 1.2  cm Rt paratracheal LAN, 1.6 cm subcarinal LAN, 1.7 cm Rt hilar LAN  CT chest 10/07/14 >> decreased LAN CT chest 04/26/16 >> atherosclerosis, borderline LAN, patchy nodularity b/l   Pulmonary function testing PFT 03/13/12>>FEV1 1.65 (57%), FEV1% 58, TLC 4.44 (79%), DLCO 62%, no BD PFT 03/18/13 >> FEV1 1.78 (69%), FEV1% 67, TLC 4.04 (75%), DLCO 92%, no BD PFT 01/13/14 >> FEV1 1.64 (70%), FEV1% 70, TLC  3.69 (68%), DLCO 79%  Past medical history HLD, Anxiety, Depression, Hidradenitis  PSHx, Medications, Allergies, Fhx, Shx reviewed.  Vital signs BP 138/86 (BP Location: Left Arm, Cuff Size: Normal)   Pulse 93   Ht 5\' 7"  (1.702 m)   Wt 277 lb 6.4 oz (125.8 kg)   SpO2 96%   BMI 43.45 kg/m   History of Present Illness: Kaitlyn [name] is a 50 y.o. female smoker with sarcoidosis, and COPD.  She is still smoking.  She weaned off MTX.  Felt breathing was worse.  Back up to 4 pills per week.    She also ran out of dulera.  Resumed 1 month ago, and has some improvement.  Ambulatory oximetry today was okay.  Physical Exam:  General - Obese  HEENT - No sinus tenderness, MP 3, no oral exudate, no LAN Cardiac - s1s2 regular, no murmur  Chest - no wheeze/rales, no dullness Abdomen - soft, + bowel sounds Extremities - no edema Neurologic - normal strength Skin - no rashes  Psychiatric - normal mood, behavior   Impression:  Pulmonary sarcoidosis. - allow her more time to using MTX again >> continue 4 pills per week - she will need to resume bactrim prophylaxis while on MTX  COPD with chronic bronchitis. - continue dulera, singulair - continue prn duoneb  Dyspnea on exertion. - likely from deconditioning in setting of COPD, sarcoidosis. - encouraged her to maintain a regular exercise regimen to maintain her stamina  Tobacco abuse. - emphasized the absolute need to not smoke - she will continue using vapor   Patient Instructions  Follow up in 2 months with Dr. Craige CottaSood or Nurse Practitioner    Coralyn HellingVineet Kinisha Soper, MD [facility] Pager:  [phone] 06/21/2016, 5:22 PM

## 2016-07-01 ENCOUNTER — Other Ambulatory Visit: Payer: Self-pay | Admitting: Internal Medicine

## 2016-07-04 ENCOUNTER — Other Ambulatory Visit: Payer: Self-pay | Admitting: *Deleted

## 2016-07-04 MED ORDER — FOLIC ACID 1 MG PO TABS: ORAL_TABLET | ORAL | 0 refills | Status: DC

## 2016-07-04 NOTE — Telephone Encounter (Signed)
[facility] 

## 2016-07-04 NOTE — Telephone Encounter (Signed)
rx sent to pharmacy by e-script  

## 2016-08-23 ENCOUNTER — Telehealth: Payer: Self-pay | Admitting: Pulmonary Disease

## 2016-08-23 MED ORDER — SULFAMETHOXAZOLE-TRIMETHOPRIM 400-80 MG PO TABS: ORAL_TABLET | ORAL | 0 refills | Status: DC

## 2016-08-23 NOTE — Telephone Encounter (Signed)
Rx for Bactrim was refilled  Pt aware and nothing further needed

## 2016-08-29 ENCOUNTER — Ambulatory Visit (INDEPENDENT_AMBULATORY_CARE_PROVIDER_SITE_OTHER): Payer: Self-pay | Admitting: Pulmonary Disease

## 2016-08-29 ENCOUNTER — Other Ambulatory Visit (INDEPENDENT_AMBULATORY_CARE_PROVIDER_SITE_OTHER): Payer: Self-pay

## 2016-08-29 ENCOUNTER — Encounter: Payer: Self-pay | Admitting: Pulmonary Disease

## 2016-08-29 VITALS — BP 116/66 | HR 72 | Ht 67.0 in | Wt 275.4 lb

## 2016-08-29 DIAGNOSIS — D860 Sarcoidosis of lung: Secondary | ICD-10-CM

## 2016-08-29 DIAGNOSIS — Z720 Tobacco use: Secondary | ICD-10-CM

## 2016-08-29 DIAGNOSIS — J449 Chronic obstructive pulmonary disease, unspecified: Secondary | ICD-10-CM

## 2016-08-29 DIAGNOSIS — Z23 Encounter for immunization: Secondary | ICD-10-CM

## 2016-08-29 DIAGNOSIS — J4489 Other specified chronic obstructive pulmonary disease: Secondary | ICD-10-CM

## 2016-08-29 LAB — COMPREHENSIVE METABOLIC PANEL
ALBUMIN: 3.8 g/dL (ref 3.5–5.2)
ALT: 17 U/L (ref 0–35)
AST: 23 U/L (ref 0–37)
Alkaline Phosphatase: 126 U/L — ABNORMAL HIGH (ref 39–117)
BUN: 13 mg/dL (ref 6–23)
CALCIUM: 9.8 mg/dL (ref 8.4–10.5)
CHLORIDE: 101 meq/L (ref 96–112)
CO2: 31 mEq/L (ref 19–32)
CREATININE: 1.14 mg/dL (ref 0.40–1.20)
GFR: 64.51 mL/min (ref >60.00)
Glucose, Bld: 95 mg/dL (ref 70–99)
Potassium: 3.8 mEq/L (ref 3.5–5.1)
Sodium: 139 mEq/L (ref 135–145)
TOTAL PROTEIN: 8.5 g/dL — AB (ref 6.0–8.3)
Total Bilirubin: 0.4 mg/dL (ref 0.2–1.2)

## 2016-08-29 LAB — CBC
HCT: 40.8 % (ref 36.0–46.0)
HEMOGLOBIN: 13.9 g/dL (ref 12.0–15.0)
MCHC: 33.9 g/dL (ref 30.0–36.0)
MCV: 92.8 fl (ref 78.0–100.0)
PLATELETS: 333 10*3/uL (ref 150.0–400.0)
RBC: 4.4 Mil/uL (ref 3.87–5.11)
RDW: 15.6 % — ABNORMAL HIGH (ref 11.5–15.5)
WBC: 11 10*3/uL — ABNORMAL HIGH (ref 4.0–10.5)

## 2016-08-29 NOTE — Patient Instructions (Signed)
Lab tests today  Follow up in 3 months 

## 2016-08-29 NOTE — Progress Notes (Signed)
Current Outpatient Prescriptions on File Prior to Visit  Medication Sig  . acetaminophen (TYLENOL) 500 MG tablet Take 500 mg by mouth every 6 (six) hours as needed. For pain  . albuterol (PROVENTIL HFA;VENTOLIN HFA) 108 (90 BASE) MCG/ACT inhaler Inhale 2 puffs into the lungs every 4 (four) hours as needed. For shortness of breath  . buPROPion (WELLBUTRIN SR) 150 MG 12 hr tablet Take 1 tablet (150 mg total) by mouth 2 (two) times daily. Patients needs to schedule appointment  . calcium carbonate (TUMS - DOSED IN MG ELEMENTAL CALCIUM) 500 MG chewable tablet Chew 1 tablet by mouth daily as needed. For indigestion  . cephALEXin (KEFLEX) 500 MG capsule Take 500mg  BID QOD-(Tues, thurs, sat, and sun)  . Cholecalciferol (VITAMIN D-3 PO) Take 5 capsules by mouth 2 (two) times a week.  . clindamycin (CLEOCIN T) 1 % lotion Apply 1 application topically as needed.   . Eflornithine HCl (VANIQA) 13.9 % cream Apply topically 2 (two) times daily as needed.   . folic acid (FOLVITE) 1 MG tablet Take one tablet by mouth every day except on days when taking Methotrexate  . ipratropium-albuterol (DUONEB) 0.5-2.5 (3) MG/3ML SOLN Take 3 mLs by nebulization 2 (two) times daily as needed. For shortness of breath  . losartan-hydrochlorothiazide (HYZAAR) 100-12.5 MG tablet TAKE 1 TABLET BY MOUTH DAILY  . magnesium oxide (MAG-OX) 400 MG tablet Take 400 mg by mouth daily.  . methotrexate (RHEUMATREX) 2.5 MG tablet TAKE 4 TABLETS(10 MG) BY MOUTH 1 TIME A WEEK  . mometasone-formoterol (DULERA) 100-5 MCG/ACT AERO Inhale 2 puffs into the lungs 2 (two) times daily.  . montelukast (SINGULAIR) 10 MG tablet TAKE ONE TABLET BY MOUTH EVERY DAY AS NEEDED FOR ALLERGIES  . phentermine (ADIPEX-P) 37.5 MG tablet Take 0.5 tablets (18.75 mg total) by mouth 2 (two) times daily.  . pravastatin (PRAVACHOL) 40 MG tablet TAKE 1 TABLET BY MOUTH EVERY DAY  . pyridOXINE (VITAMIN B-6) 50 MG tablet Take 50 mg by mouth daily.  . sertraline (ZOLOFT) 25  MG tablet Take 1 tablet (25 mg total) by mouth daily.  Marland Kitchen. sulfamethoxazole-trimethoprim (BACTRIM,SEPTRA) 400-80 MG tablet TAKE ONE TABLET BY MOUTH THREE TIMES A WEEK  . traMADol (ULTRAM) 50 MG tablet Take 1 tablet (50 mg total) by mouth every 6 (six) hours as needed for severe pain.  . TURMERIC PO Take 538 mg by mouth daily.  . vitamin B-12 (CYANOCOBALAMIN) 100 MCG tablet Take 100 mcg by mouth daily.  Marland Kitchen. zinc gluconate 50 MG tablet Take 50 mg by mouth daily.   No current facility-administered medications on file prior to visit.     Chief Complaint  Patient presents with  . Follow-up    2 month follow up - reports breathing is unchanged with DOE.  taking Dulera 1puff daily due to cost    Tests/Events EBUS with Tbx 03/01/12>>Streptococcus pneumoniae in BAL, cytology/Tbx negative 04/30/12 start prednisone Labs 04/29/13 >> Hep B S Ag negative, Hep B S Ab reactive, Hep B core Ab negative, Hep B core IgM negative, HCV Ab negative 04/30/13 Start MTX  CT chest CT chest 01/23/12>>bulky mediastinal/hilar LAN.  Scattered ill-defined upper lobe predominate perilymphatic and centrilobular nodules. CT chest 04/30/12>>No change in the appearance of the mediastinal and hilar adenopathy and pulmonary nodularity CT chest 07/31/12>>b/l hilar adenopathy no change, b/l upper lobe subpleural nodularity, improved LUL GGO CT chest 04/14/13 >> peribronchovascular nodularity b/l upper lobes Rt > Lt mildly progressed, no change to mediastinal/hilar adenopathy CT chest 01/05/14 >>  upper lobe predominant prebronchovascular reticulo-nodule opacities, 1.2 cm Rt paratracheal LAN, 1.6 cm subcarinal LAN, 1.7 cm Rt hilar LAN  CT chest 10/07/14 >> decreased LAN CT chest 04/26/16 >> atherosclerosis, borderline LAN, patchy nodularity b/l   Pulmonary function testing PFT 03/13/12>>FEV1 1.65 (57%), FEV1% 58, TLC 4.44 (79%), DLCO 62%, no BD PFT 03/18/13 >> FEV1 1.78 (69%), FEV1% 67, TLC 4.04 (75%), DLCO 92%, no BD PFT 01/13/14 >> FEV1  1.64 (70%), FEV1% 70, TLC 3.69 (68%), DLCO 79%  Past medical history HLD, Anxiety, Depression, Hidradenitis  PSHx, Medications, Allergies, Fhx, Shx reviewed.  Vital signs BP 116/66 (BP Location: Left Arm, Cuff Size: Large)   Pulse 72   Ht 5\' 7"  (1.702 m)   Wt 275 lb 6.4 oz (124.9 kg)   SpO2 97%   BMI 43.13 kg/m   History of Present Illness: Kaitlyn [name] is a 50 y.o. female smoker with sarcoidosis, and COPD.  She is slowly cutting down on her cigarettes.  She has to use dulera only once per day to make inhaler last longer.    She is not having cough, wheeze, sputum, fever, hemoptysis, or chest pain.    She was seen by dermatology and told she might has skin lesions from sarcoidosis.  She was to have biopsy, but then her dermatologist retired and she hasn't set up appointment with new dermatologist.  She also has bump on her wrists b/l.  This has been present for two weeks and showed up while she was at her boyfriend's in [place].  Physical Exam:  General - Obese  HEENT - No sinus tenderness, MP 3, no oral exudate, no LAN Cardiac - s1s2 regular, no murmur  Chest - no wheeze/rales, no dullness Abdomen - soft, + bowel sounds Extremities - no edema Neurologic - normal strength Skin - no rashes  Psychiatric - normal mood, behavior   Impression:  Pulmonary sarcoidosis. - continue MTX 4 pills weekly - check labs to today - continue folic acid and bactrim while on MTX - flu shot today  COPD with chronic bronchitis. - continue dulera, singulair - continue prn duoneb  Tobacco abuse. - emphasized the absolute need to not smoke - she will continue using vapor   Patient Instructions  Lab tests today  Follow up in 3 months    Coralyn HellingVineet Chaitra Mast, MD [facility] Pager:  [phone] 08/29/2016, 3:32 PM

## 2016-08-30 ENCOUNTER — Telehealth: Payer: Self-pay | Admitting: Pulmonary Disease

## 2016-08-30 NOTE — Telephone Encounter (Signed)
Pt in the lobby She is also requesting letter for her employer stating that she did receive her flu vaccine Letter done and given to pt Lab results also relayed to patient - she voiced her understanding  Nothing further needed; will sign off

## 2016-08-30 NOTE — Telephone Encounter (Signed)
lmtcb x1 for pt. 

## 2016-08-30 NOTE — Telephone Encounter (Signed)
CMP Latest Ref Rng & Units 08/29/2016 02/25/2016 12/28/2015  Glucose 70 - 99 mg/dL 95 83 89  BUN 6 - 23 mg/dL 13 14 14   Creatinine 0.40 - 1.20 mg/dL 4.091.14 8.11(B1.11(H) 1.471.08  Sodium 135 - 145 mEq/L 139 140 137  Potassium 3.5 - 5.1 mEq/L 3.8 4.5 4.3  Chloride 96 - 112 mEq/L 101 98 102  CO2 19 - 32 mEq/L 31 26 31   Calcium 8.4 - 10.5 mg/dL 9.8 9.6 9.5  Total Protein 6.0 - 8.3 g/dL 8.2(N8.5(H) 7.6 8.0  Total Bilirubin 0.2 - 1.2 mg/dL 0.4 0.3 0.3  Alkaline Phos 39 - 117 U/L 126(H) 122(H) 102  AST 0 - 37 U/L 23 26 17   ALT 0 - 35 U/L 17 18 12     CBC Latest Ref Rng & Units 08/29/2016 02/25/2016 12/28/2015  WBC 4.0 - 10.5 K/uL 11.0(H) 5.3 6.9  Hemoglobin 12.0 - 15.0 g/dL 56.213.9 - 13.013.0  Hematocrit 36.0 - 46.0 % 40.8 43.0 39.2  Platelets 150.0 - 400.0 K/uL 333.0 237 240.0    Will have my nurse inform pt that lab tests are okay.  Continue current tx plan.

## 2016-09-28 ENCOUNTER — Encounter: Payer: Self-pay | Admitting: Internal Medicine

## 2016-09-30 ENCOUNTER — Other Ambulatory Visit: Payer: Self-pay | Admitting: Internal Medicine

## 2016-09-30 ENCOUNTER — Other Ambulatory Visit: Payer: Self-pay | Admitting: Pulmonary Disease

## 2016-10-07 ENCOUNTER — Other Ambulatory Visit: Payer: Self-pay | Admitting: Internal Medicine

## 2016-10-07 DIAGNOSIS — F3341 Major depressive disorder, recurrent, in partial remission: Secondary | ICD-10-CM

## 2016-10-13 ENCOUNTER — Telehealth: Payer: Self-pay | Admitting: Pulmonary Disease

## 2016-10-13 MED ORDER — MOMETASONE FURO-FORMOTEROL FUM 100-5 MCG/ACT IN AERO
2.0000 | INHALATION_SPRAY | Freq: Two times a day (BID) | RESPIRATORY_TRACT | 0 refills | Status: DC
Start: 2016-10-13 — End: 2016-12-28

## 2016-10-13 NOTE — Telephone Encounter (Signed)
Samples placed up front for pick up. Pt aware and voiced understanding. Nothing further needed.

## 2016-10-13 NOTE — Telephone Encounter (Signed)
LMTCB

## 2016-11-05 ENCOUNTER — Other Ambulatory Visit: Payer: Self-pay | Admitting: Internal Medicine

## 2016-11-05 DIAGNOSIS — F3341 Major depressive disorder, recurrent, in partial remission: Secondary | ICD-10-CM

## 2016-11-06 ENCOUNTER — Telehealth: Payer: Self-pay | Admitting: Pulmonary Disease

## 2016-11-06 NOTE — Telephone Encounter (Signed)
Spoke with pt. States that she is having a flare up of sarcoid. Reports cough, chest congestion, chest discomfort, SOB and wheezing. Cough is non productive at this time. Feels like she might need to adjust her methotrexate. While on the phone, she mentioned needing refills on Duoneb and Albuterol HFA. These have been sent in.  PM - please advise as VS worked 11pm Elink last night. Thanks.

## 2016-11-06 NOTE — Telephone Encounter (Signed)
lmtcb X1 

## 2016-11-06 NOTE — Telephone Encounter (Signed)
Ok to refill Duonebs and albuterol. It would be better for her to be seen in office for eval before adjusting methotrexate dose.

## 2016-11-07 NOTE — Telephone Encounter (Signed)
lmtcb X2 for pt- need to schedule rov to discuss methotrexate dosage.

## 2016-11-08 MED ORDER — METHOTREXATE 2.5 MG PO TABS: ORAL_TABLET | ORAL | 0 refills | Status: DC

## 2016-11-08 MED ORDER — PREDNISONE 10 MG PO TABS: ORAL_TABLET | ORAL | 0 refills | Status: DC

## 2016-11-08 NOTE — Telephone Encounter (Signed)
Spoke with pt, aware of recs.  Pt states she will call back if her s/s don't improve after pred taper.  rx called in to preferred pharmacy.  Nothing further needed at this time.

## 2016-11-08 NOTE — Telephone Encounter (Signed)
Send script for prednisone 10 mg pill >> 3 pills daily for 3 days, 2 pills daily for 3 days, 1 pill daily for 3 days.  #18 pills with no refills.  Advise her to call back if she is still not feeling better after this, but she will then need an ROV.

## 2016-11-08 NOTE — Telephone Encounter (Signed)
Spoke with pt. She became very rude and didn't understand why PM wanted her to have an appointment. Stated that she would need a refill on Methotrexate regardless. Rx has been sent in. Pt wants this message addressed by VS ONLY.  VS - please advise. Thanks.

## 2016-12-01 ENCOUNTER — Ambulatory Visit: Payer: Self-pay | Admitting: Pulmonary Disease

## 2016-12-20 ENCOUNTER — Telehealth: Payer: Self-pay

## 2016-12-20 NOTE — Telephone Encounter (Signed)
I called patient to discuss closing the gap and meeting quality metrics (pap smear) recommended for patient's age group. Patient states she is unable to get a pap smear due to cost.  Patient is interested in referral to [facility] for community resources to help assist in cost.   Dr.Reed please review/complete pending referral if in agreement

## 2016-12-21 ENCOUNTER — Other Ambulatory Visit: Payer: Self-pay | Admitting: Internal Medicine

## 2016-12-21 DIAGNOSIS — Z124 Encounter for screening for malignant neoplasm of cervix: Secondary | ICD-10-CM

## 2016-12-28 ENCOUNTER — Encounter: Payer: Self-pay | Admitting: Internal Medicine

## 2016-12-28 ENCOUNTER — Ambulatory Visit (INDEPENDENT_AMBULATORY_CARE_PROVIDER_SITE_OTHER): Payer: Self-pay | Admitting: Internal Medicine

## 2016-12-28 ENCOUNTER — Other Ambulatory Visit: Payer: Self-pay | Admitting: Pulmonary Disease

## 2016-12-28 VITALS — BP 118/76 | HR 58 | Temp 98.3°F | Ht 67.0 in | Wt 280.4 lb

## 2016-12-28 DIAGNOSIS — Z131 Encounter for screening for diabetes mellitus: Secondary | ICD-10-CM

## 2016-12-28 DIAGNOSIS — M25552 Pain in left hip: Secondary | ICD-10-CM

## 2016-12-28 DIAGNOSIS — T50905A Adverse effect of unspecified drugs, medicaments and biological substances, initial encounter: Secondary | ICD-10-CM

## 2016-12-28 DIAGNOSIS — Z0000 Encounter for general adult medical examination without abnormal findings: Secondary | ICD-10-CM

## 2016-12-28 DIAGNOSIS — I10 Essential (primary) hypertension: Secondary | ICD-10-CM

## 2016-12-28 DIAGNOSIS — E7800 Pure hypercholesterolemia, unspecified: Secondary | ICD-10-CM

## 2016-12-28 DIAGNOSIS — Z23 Encounter for immunization: Secondary | ICD-10-CM

## 2016-12-28 DIAGNOSIS — Z1211 Encounter for screening for malignant neoplasm of colon: Secondary | ICD-10-CM

## 2016-12-28 DIAGNOSIS — Z1239 Encounter for other screening for malignant neoplasm of breast: Secondary | ICD-10-CM

## 2016-12-28 DIAGNOSIS — R635 Abnormal weight gain: Secondary | ICD-10-CM

## 2016-12-28 DIAGNOSIS — Z1231 Encounter for screening mammogram for malignant neoplasm of breast: Secondary | ICD-10-CM

## 2016-12-28 DIAGNOSIS — M25561 Pain in right knee: Secondary | ICD-10-CM

## 2016-12-28 LAB — CBC WITH DIFFERENTIAL/PLATELET
Basophils Absolute: 60 cells/uL (ref 0–200)
Basophils Relative: 1 %
Eosinophils Absolute: 180 cells/uL (ref 15–500)
Eosinophils Relative: 3 %
HCT: 37.4 % (ref 35.0–45.0)
Hemoglobin: 11.9 g/dL (ref 11.7–15.5)
Lymphocytes Relative: 23 %
Lymphs Abs: 1380 cells/uL (ref 850–3900)
MCH: 29.4 pg (ref 27.0–33.0)
MCHC: 31.8 g/dL — ABNORMAL LOW (ref 32.0–36.0)
MCV: 92.3 fL (ref 80.0–100.0)
MPV: 10.9 fL (ref 7.5–12.5)
Monocytes Absolute: 480 cells/uL (ref 200–950)
Monocytes Relative: 8 %
Neutro Abs: 3900 cells/uL (ref 1500–7800)
Neutrophils Relative %: 65 %
Platelets: 232 10*3/uL (ref 140–400)
RBC: 4.05 MIL/uL (ref 3.80–5.10)
RDW: 15.3 % — ABNORMAL HIGH (ref 11.0–15.0)
WBC: 6 10*3/uL (ref 3.8–10.8)

## 2016-12-28 LAB — HEMOGLOBIN A1C
Hgb A1c MFr Bld: 5.4 % (ref <5.7)
Mean Plasma Glucose: 108 mg/dL

## 2016-12-28 LAB — COMPLETE METABOLIC PANEL WITH GFR
ALT: 18 U/L (ref 6–29)
AST: 23 U/L (ref 10–35)
Albumin: 3.2 g/dL — ABNORMAL LOW (ref 3.6–5.1)
Alkaline Phosphatase: 98 U/L (ref 33–130)
BUN: 9 mg/dL (ref 7–25)
CO2: 32 mmol/L — ABNORMAL HIGH (ref 20–31)
Calcium: 8.8 mg/dL (ref 8.6–10.4)
Chloride: 105 mmol/L (ref 98–110)
Creat: 1.02 mg/dL (ref 0.50–1.05)
GFR, Est African American: 74 mL/min (ref >=60)
GFR, Est Non African American: 64 mL/min (ref >=60)
Glucose, Bld: 80 mg/dL (ref 65–99)
Potassium: 4.1 mmol/L (ref 3.5–5.3)
Sodium: 140 mmol/L (ref 135–146)
Total Bilirubin: 0.3 mg/dL (ref 0.2–1.2)
Total Protein: 6.6 g/dL (ref 6.1–8.1)

## 2016-12-28 LAB — LIPID PANEL
Cholesterol: 141 mg/dL (ref <200)
HDL: 47 mg/dL — ABNORMAL LOW (ref >50)
LDL Cholesterol: 75 mg/dL (ref <100)
Total CHOL/HDL Ratio: 3 Ratio (ref <5.0)
Triglycerides: 97 mg/dL (ref <150)
VLDL: 19 mg/dL (ref <30)

## 2016-12-28 MED ORDER — TRAMADOL HCL 50 MG PO TABS: 50.0000 mg | ORAL_TABLET | Freq: Four times a day (QID) | ORAL | 1 refills | Status: DC | PRN

## 2016-12-28 MED ORDER — DICLOFENAC SODIUM 1 % TD GEL: 4.0000 g | Freq: Four times a day (QID) | TRANSDERMAL | 5 refills | Status: DC

## 2016-12-28 MED ORDER — PHENTERMINE HCL 37.5 MG PO TABS: 18.7500 mg | ORAL_TABLET | Freq: Two times a day (BID) | ORAL | 0 refills | Status: DC

## 2016-12-28 NOTE — Progress Notes (Signed)
Provider:  Gwenith Spitziffany L. Renato [name], D.O., C.M.D. Location:   [facility]   Place of Service:   clinic  Previous PCP: Kaitlyn [name], Kaitlyn Slager, Kaitlyn [name] Patient Care Team: Kaitlyn Baloiffany L Norrin Shreffler, Kaitlyn [name] as PCP - General (Geriatric Medicine)  Extended Emergency Contact Information Primary Emergency Contact: Bo McclintockBennett Sr,Jerry Address: [address]          [place], [place] [id number] [place] Home Phone: [phone] Relation: Father Secondary Emergency Contact: Julieta GuttingBennett Jr,Jerry Address: [address]          [place], [place] [id number] [place] Home Phone: [phone] Relation: Brother  Code Status: full code Goals of Care: Advanced Directive information Advanced Directives 12/28/2016  Does Patient Have a Medical Advance Directive? No  Would patient like information on creating a medical advance directive? No - Patient declined  Pre-existing out of facility DNR order (yellow form or pink MOST form) -   Chief Complaint  Patient presents with  . Annual Exam    Yearly Exam    HPI: Patient is a 51 y.o. female seen today for an annual physical exam.    Prevention:   Pap--refused due to not being able to afford, she says.  C3 team referral was placed to figure it out.  She now wants a gyn referral.  Is sexually active now occasionally in relationship with man in [place].  Also needs her mammogram again.    Tobacco cessation:  Still doing this off and on.  Quits, goes down to the blu cigs and then goes back when smelling her brother's cigarettes in the house.  Needs prevnar and td.  Refuses at this time.  She needs to f/u with pulmonary for PFTs for her COPD with bronchitis and sarcoid.   They tried to wean her off methotrexate, but had to go back on prednisone and 4 mtx each Sunday.  She's been eating everything in sight from the prednisone.  Wants phentermine again due to that.   Needs to schedule pulmonary appt--last went in Oct.  Next is 3/20.     Having hip and knee pain when she tries to  get up off the commode (left hip, right knee).  Takes tylenol.  Used some voltaren gel that her mom had.  Helped a little. Also feels she needs the tramadol renewed.    Past Medical History:  Diagnosis Date  . Anxiety   . Asthma    inhalers   . Carpal tunnel syndrome 01/2015   right  . 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 Depression   . Extrinsic asthma, unspecified   . Hidradenitis   . Hyperlipidemia    takes pravachol  . Hypertension   . Keloid scar   . Obesity   . Osteoarthritis   . Osteoarthrosis, unspecified whether generalized or localized, unspecified site   . PONV (postoperative nausea and vomiting)   . 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 Sarcoidosis (HCC)   . Shortness of breath    exertion  . Wears dentures    top   Past Surgical History:  Procedure Laterality Date  . MULTIPLE TOOTH EXTRACTIONS  2010  . NASAL TURBINATE REDUCTION Bilateral 06/29/2009   inferior  . OPEN REDUCTION NASAL FRACTURE  06/29/2009  with exc. bx. left intranasal papilloma  . OTHER SURGICAL HISTORY  2010   cyst removal from right middle finger    . REMOVAL OF GROWTH FROM FINGER     DR Metro Kung  . VIDEO BRONCHOSCOPY WITH ENDOBRONCHIAL ULTRASOUND  03/01/2012    reports that she has been smoking Cigarettes.  She has a 35.00 pack-year smoking history. She has never used smokeless tobacco. She reports that she drinks alcohol. She reports that she does not use drugs.  Functional Status Survey:    Family History  Problem Relation Age of Onset  . Cancer Maternal Aunt     mother's aunt  . Diabetes Maternal Aunt     Health Maintenance  Topic Date Due  . HIV Screening  03/19/1980  . TETANUS/TDAP   03/19/1984  . COLONOSCOPY  03/20/2015  . PAP SMEAR  03/30/2017 (Originally 03/19/1986)  . MAMMOGRAM  01/28/2017  . INFLUENZA VACCINE  Completed    Allergies  Allergen Reactions  . Codeine Other (See Comments)    Reaction unknown  . Cyclinex [Tetracycline Hcl] Other (See Comments)    Reaction unknown, all "cyclines"  . Milk Thistle Hives  . Morphine And Related Other (See Comments)    Reaction unknown    Allergies as of 12/28/2016      Reactions   Codeine Other (See Comments)   Reaction unknown   Cyclinex [tetracycline Hcl] Other (See Comments)   Reaction unknown, all "cyclines"   Milk Thistle Hives   Morphine And Related Other (See Comments)   Reaction unknown      Medication List       Accurate as of 12/28/16  9:32 AM. Always use your most recent med list.          acetaminophen 500 MG tablet Commonly known as:  TYLENOL Take 500 mg by mouth every 6 (six) hours as needed. For pain   albuterol 108 (90 Base) MCG/ACT inhaler Commonly known as:  PROVENTIL HFA;VENTOLIN HFA Inhale 2 puffs into the lungs every 4 (four) hours as needed. For shortness of breath   buPROPion 150 MG 12 hr tablet Commonly known as:  WELLBUTRIN SR Take 1 tablet (150 mg total) by mouth 2 (two) times daily. Patients needs to schedule appointment   calcium carbonate 500 MG chewable tablet Commonly known as:  TUMS - dosed in mg elemental calcium Chew 1 tablet by mouth daily as needed. For indigestion   cephALEXin 500 MG capsule Commonly known as:  KEFLEX Take 500mg  BID QOD-(Tues, thurs, sat, and sun)   clindamycin 1 % lotion Commonly known as:  CLEOCIN T Apply 1 application topically as needed.   folic acid 1 MG tablet Commonly known as:  FOLVITE TAKE 1 TABLET BY MOUTH EVERY DAY EXCEPT ON DAYS WHEN TAKING METHOTREXATE.   GLUCOSAMINE HCL PO Take 1 tablet by mouth 2 (two) times daily.   ipratropium-albuterol 0.5-2.5 (3) MG/3ML Soln Commonly known as:  DUONEB Take 3 mLs by nebulization 2  (two) times daily as needed. For shortness of breath   losartan-hydrochlorothiazide 100-12.5 MG tablet Commonly known as:  HYZAAR TAKE 1 TABLET BY MOUTH DAILY   magnesium oxide 400 MG tablet Commonly known as:  MAG-OX Take 400 mg by mouth daily.   methotrexate 2.5 MG tablet Commonly known as:  RHEUMATREX TAKE 4 TABLETS BY MOUTH ONCE A WEEK   mometasone-formoterol 100-5 MCG/ACT Aero Commonly known as:  DULERA Inhale 2 puffs into the lungs 2 (two) times daily.   mometasone-formoterol 100-5 MCG/ACT Aero Commonly  known as:  DULERA Inhale 2 puffs into the lungs 2 (two) times daily.   montelukast 10 MG tablet Commonly known as:  SINGULAIR TAKE ONE TABLET BY MOUTH EVERY DAY AS NEEDED FOR ALLERGIES   phentermine 37.5 MG tablet Commonly known as:  ADIPEX-P Take 0.5 tablets (18.75 mg total) by mouth 2 (two) times daily.   pravastatin 40 MG tablet Commonly known as:  PRAVACHOL TAKE 1 TABLET BY MOUTH EVERY DAY   predniSONE 10 MG tablet Commonly known as:  DELTASONE 30mg  X3 days, 20mg  X3 days, 10mg X3 days, then stop.   pyridOXINE 50 MG tablet Commonly known as:  VITAMIN B-6 Take 50 mg by mouth daily.   sertraline 25 MG tablet Commonly known as:  ZOLOFT TAKE 1 TABLET(25 MG) BY MOUTH DAILY   sulfamethoxazole-trimethoprim 400-80 MG tablet Commonly known as:  BACTRIM,SEPTRA TAKE ONE TABLET BY MOUTH THREE TIMES A WEEK   traMADol 50 MG tablet Commonly known as:  ULTRAM Take 1 tablet (50 mg total) by mouth every 6 (six) hours as needed for severe pain.   TURMERIC PO Take 538 mg by mouth daily.   VANIQA 13.9 % cream Generic drug:  Eflornithine HCl Apply topically 2 (two) times daily as needed.   vitamin B-12 100 MCG tablet Commonly known as:  CYANOCOBALAMIN Take 100 mcg by mouth daily.   Vitamin D-3 5000 units Tabs Take 5 capsules by mouth once a week.   zinc gluconate 50 MG tablet Take 50 mg by mouth daily.       Review of Systems  Constitutional: Negative for  chills, fever and malaise/fatigue.       Weight gain  HENT: Negative for congestion and hearing loss.   Eyes: Negative for blurred vision.  Respiratory: Positive for cough, sputum production, shortness of breath and wheezing. Negative for hemoptysis.        Smoking  Cardiovascular: Negative for chest pain, palpitations and leg swelling.  Gastrointestinal: Positive for heartburn. Negative for abdominal pain, blood in stool, constipation, diarrhea, melena, nausea and vomiting.       Has bm after each meal  Genitourinary: Negative for dysuria.  Musculoskeletal: Positive for joint pain. Negative for falls.       Left hip and right knee  Skin: Negative for itching and rash.  Neurological: Negative for dizziness, tingling, tremors, loss of consciousness and weakness.  Endo/Heme/Allergies: Does not bruise/bleed easily.  Psychiatric/Behavioral: Negative for depression and memory loss. The patient is not nervous/anxious.        Bipolar disorder    Vitals:   12/28/16 0916  BP: 118/76  Pulse: (!) 58  Temp: 98.3 F (36.8 C)  TempSrc: Oral  SpO2: 96%  Weight: 280 lb 6.4 oz (127.2 kg)  Height: 5\' 7"  (1.702 m)   Body mass index is 43.92 kg/m. Physical Exam  Constitutional: She is oriented to person, place, and time. She appears well-developed and well-nourished. No distress.  HENT:  Head: Normocephalic and atraumatic.  Right Ear: External ear normal.  Left Ear: External ear normal.  Nose: Nose normal.  Mouth/Throat: Oropharynx is clear and moist. No oropharyngeal exudate.  Eyes: Conjunctivae and EOM are normal. Pupils are equal, round, and reactive to light.  Neck: Normal range of motion. Neck supple. No JVD present.  Cardiovascular: Normal rate, regular rhythm, normal heart sounds and intact distal pulses.   Pulmonary/Chest: Effort normal.  Decreased breath sounds on expiration  Abdominal: Soft. Bowel sounds are normal. She exhibits no distension. There is no tenderness.  Musculoskeletal: Normal range of motion.  Tenderness of right anterior knee beneath patella; left groin and lateral hip tenderness  Lymphadenopathy:    She has no cervical adenopathy.  Neurological: She is alert and oriented to person, place, and time. No cranial nerve deficit or sensory deficit. She exhibits normal muscle tone. Coordination normal.  Could not obtain left patellar reflex  Skin: Skin is warm and dry. Capillary refill takes less than 2 seconds.    Labs reviewed: Basic Metabolic Panel:  Recent Labs  81/19/14 0929 08/29/16 1540  NA 140 139  K 4.5 3.8  CL 98 101  CO2 26 31  GLUCOSE 83 95  BUN 14 13  CREATININE 1.11* 1.14  CALCIUM 9.6 9.8   Liver Function Tests:  Recent Labs  02/25/16 0929 08/29/16 1540  AST 26 23  ALT 18 17  ALKPHOS 122* 126*  BILITOT 0.3 0.4  PROT 7.6 8.5*  ALBUMIN 3.8 3.8   No results for input(s): LIPASE, AMYLASE in the last 8760 hours. No results for input(s): AMMONIA in the last 8760 hours. CBC:  Recent Labs  02/25/16 0929 08/29/16 1540  WBC 5.3 11.0*  NEUTROABS 3.6  --   HGB  --  13.9  HCT 43.0 40.8  MCV 94 92.8  PLT 237 333.0   Cardiac Enzymes: No results for input(s): CKTOTAL, CKMB, CKMBINDEX, TROPONINI in the last 8760 hours. BNP: Invalid input(s): POCBNP Lab Results  Component Value Date   HGBA1C 6.0 (H) 02/25/2016   Lab Results  Component Value Date   TSH 1.150 02/25/2016   No results found for: VITAMINB12 No results found for: FOLATE No results found for: IRON, TIBC, FERRITIN  Imaging and Procedures Recently: EKG performed today:  NSR with old septal MI suspected, intraventricular block, rate 60  Assessment/Plan 1. Annual physical exam -performed today, to see gyn for breast exam, pap/pelvic (says she has not had this for decades and has refused here since I've known her) - Lipid panel - CBC with Differential/Platelet - COMPLETE METABOLIC PANEL WITH GFR - Hemoglobin A1c  2. Benign essential  HTN - bp well controlled, also has not taken her med for this for three days by report--she did not seem sure about this so we opted to continue it - EKG 12-Lead was unremarkable for acute changes - CBC with Differential/Platelet - COMPLETE METABOLIC PANEL WITH GFR  3. Screening for breast cancer - MM DIGITAL SCREENING BILATERAL; Future  4. Colon cancer screening - hoping she will follow through with this--referral placed - Ambulatory referral to Gastroenterology for colonoscopy  5. Need for TD vaccine - Td : Tetanus/diphtheria >7yo Preservative  Free given -tdap not done due to cost in office  6. Weight gain due to medication - advised not to take phentermine forever, needs labs to f/u on everything - given Rx w/o refills, will need to call for refills and will see in 6 mos to check back in on her weight--hoping she will not need it longer than that - phentermine (ADIPEX-P) 37.5 MG tablet; Take 0.5 tablets (18.75 mg total) by mouth 2 (two) times daily.  Dispense: 30 tablet; Refill: 0 - Lipid panel - CBC with Differential/Platelet - COMPLETE METABOLIC PANEL WITH GFR - Hemoglobin A1c  7. Left hip pain - new concern--refused imaging to evaluate but does seem consistent with arthritis made worse by her obesity - traMADol (ULTRAM) 50 MG tablet; Take 1 tablet (50 mg total) by mouth every 6 (six) hours as needed for severe pain.  Dispense:  120 tablet; Refill: 1 - diclofenac sodium (VOLTAREN) 1 % GEL; Apply 4 g topically 4 (four) times daily. To left hip, right knee  Dispense: 100 g; Refill: 5  8. Anterior knee pain, right - worse when getting up from a low seat or commode - ?chondromalacia or simply a cartilage damage from years of obesity on her joint - diclofenac sodium (VOLTAREN) 1 % GEL; Apply 4 g topically 4 (four) times daily. To left hip, right knee  Dispense: 100 g; Refill: 5  9. Pure hypercholesterolemia - ongoing, cont pravachol and f/u lipids today - Lipid panel--says she  fasted  10. Encounter for screening examination for impaired glucose regulation and diabetes mellitus - due to weight gain and several prior prednisone uses, f/u sugar average - Hemoglobin A1c  Labs/tests ordered:   Orders Placed This Encounter  Procedures  . MM DIGITAL SCREENING BILATERAL    Standing Status:   Future    Standing Expiration Date:   02/27/2018    Order Specific Question:   Reason for exam:    Answer:   screening for breast cancer    Order Specific Question:   Is the patient pregnant?    Answer:   No    Order Specific Question:   Preferred imaging location?    Answer:   [facility]  . Td : Tetanus/diphtheria >7yo Preservative  free  . Lipid panel    Order Specific Question:   Has the patient fasted?    Answer:   Yes  . CBC with Differential/Platelet  . COMPLETE METABOLIC PANEL WITH GFR    [facility]  . Hemoglobin A1c  . Ambulatory referral to Gastroenterology    Referral Priority:   Routine    Referral Type:   Consultation    Referral Reason:   Specialty Services Required    Number of Visits Requested:   1  . EKG 12-Lead   Kaitlyn [name], D.O. Geriatrics [facility] [address], [place] [address] Cell Phone (Mon-Fri 8am-5pm):  [phone] On Call:  [phone] & follow prompts after 5pm & weekends Office Phone:  [phone] Office Fax:  [phone]

## 2017-01-02 ENCOUNTER — Other Ambulatory Visit: Payer: Self-pay | Admitting: Internal Medicine

## 2017-01-02 DIAGNOSIS — Z1231 Encounter for screening mammogram for malignant neoplasm of breast: Secondary | ICD-10-CM

## 2017-01-03 ENCOUNTER — Other Ambulatory Visit: Payer: Self-pay | Admitting: Pulmonary Disease

## 2017-01-08 NOTE — Telephone Encounter (Signed)
I did this before and then pt actually requested gyn referral at her appointment which was placed.

## 2017-01-10 ENCOUNTER — Other Ambulatory Visit: Payer: Self-pay | Admitting: Internal Medicine

## 2017-01-16 ENCOUNTER — Ambulatory Visit (INDEPENDENT_AMBULATORY_CARE_PROVIDER_SITE_OTHER): Payer: Self-pay | Admitting: Pulmonary Disease

## 2017-01-16 ENCOUNTER — Encounter: Payer: Self-pay | Admitting: Pulmonary Disease

## 2017-01-16 VITALS — BP 124/80 | HR 84 | Ht 67.0 in | Wt 281.0 lb

## 2017-01-16 DIAGNOSIS — Z789 Other specified health status: Secondary | ICD-10-CM

## 2017-01-16 DIAGNOSIS — D860 Sarcoidosis of lung: Secondary | ICD-10-CM

## 2017-01-16 DIAGNOSIS — J449 Chronic obstructive pulmonary disease, unspecified: Secondary | ICD-10-CM

## 2017-01-16 NOTE — Progress Notes (Signed)
Current Outpatient Prescriptions on File Prior to Visit  Medication Sig  . acetaminophen (TYLENOL) 500 MG tablet Take 500 mg by mouth every 6 (six) hours as needed. For pain  . albuterol (PROVENTIL HFA;VENTOLIN HFA) 108 (90 BASE) MCG/ACT inhaler Inhale 2 puffs into the lungs every 4 (four) hours as needed. For shortness of breath  . buPROPion (WELLBUTRIN SR) 150 MG 12 hr tablet TAKE 1 TABLET BY MOUTH TWICE DAILY  . calcium carbonate (TUMS - DOSED IN MG ELEMENTAL CALCIUM) 500 MG chewable tablet Chew 1 tablet by mouth daily as needed. For indigestion  . cephALEXin (KEFLEX) 500 MG capsule Take 500mg  BID QOD-(Tues, thurs, sat, and sun)  . Cholecalciferol (VITAMIN D-3) 5000 units TABS Take 5 capsules by mouth once a week.   . clindamycin (CLEOCIN T) 1 % lotion Apply 1 application topically as needed.   . diclofenac sodium (VOLTAREN) 1 % GEL Apply 4 g topically 4 (four) times daily. To left hip, right knee  . Eflornithine HCl (VANIQA) 13.9 % cream Apply topically 2 (two) times daily as needed.   . folic acid (FOLVITE) 1 MG tablet TAKE 1 TABLET BY MOUTH EVERY DAY EXCEPT ON DAYS WHEN TAKING METHOTREXATE.  Marland Kitchen GLUCOSAMINE HCL PO Take 1 tablet by mouth 2 (two) times daily.  Marland Kitchen ipratropium-albuterol (DUONEB) 0.5-2.5 (3) MG/3ML SOLN Take 3 mLs by nebulization 2 (two) times daily as needed. For shortness of breath  . losartan-hydrochlorothiazide (HYZAAR) 100-12.5 MG tablet TAKE 1 TABLET BY MOUTH DAILY  . methotrexate (RHEUMATREX) 2.5 MG tablet TAKE 4 TABLETS BY MOUTH 1 TIME A WEEK  . mometasone-formoterol (DULERA) 100-5 MCG/ACT AERO Inhale 2 puffs into the lungs 2 (two) times daily.  . montelukast (SINGULAIR) 10 MG tablet TAKE ONE TABLET BY MOUTH EVERY DAY AS NEEDED FOR ALLERGIES  . phentermine (ADIPEX-P) 37.5 MG tablet Take 0.5 tablets (18.75 mg total) by mouth 2 (two) times daily.  . pravastatin (PRAVACHOL) 40 MG tablet TAKE 1 TABLET BY MOUTH EVERY DAY  . pyridOXINE (VITAMIN B-6) 50 MG tablet Take 50 mg by  mouth daily.  . sertraline (ZOLOFT) 25 MG tablet TAKE 1 TABLET(25 MG) BY MOUTH DAILY  . sulfamethoxazole-trimethoprim (BACTRIM,SEPTRA) 400-80 MG tablet TAKE ONE TABLET BY MOUTH THREE TIMES A WEEK  . traMADol (ULTRAM) 50 MG tablet Take 1 tablet (50 mg total) by mouth every 6 (six) hours as needed for severe pain.  . TURMERIC PO Take 538 mg by mouth daily.  . vitamin B-12 (CYANOCOBALAMIN) 100 MCG tablet Take 100 mcg by mouth daily.  Marland Kitchen zinc gluconate 50 MG tablet Take 50 mg by mouth daily.   No current facility-administered medications on file prior to visit.     Chief Complaint  Patient presents with  . Follow-up    Pt notes no change in breathing since last OV. Pt states that she is having some tingling in her right hand x 1 month - pt wants to know if possible side effect of the Methotrexate (pt also has noted carpal tunnel)    Tests/Events EBUS with Tbx 03/01/12>>Streptococcus pneumoniae in BAL, cytology/Tbx negative 04/30/12 start prednisone Labs 04/29/13 >> Hep B S Ag negative, Hep B S Ab reactive, Hep B core Ab negative, Hep B core IgM negative, HCV Ab negative 04/30/13 Start MTX  CT chest CT chest 01/23/12>>bulky mediastinal/hilar LAN.  Scattered ill-defined upper lobe predominate perilymphatic and centrilobular nodules. CT chest 04/30/12>>No change in the appearance of the mediastinal and hilar adenopathy and pulmonary nodularity CT chest 07/31/12>>b/l hilar adenopathy no  change, b/l upper lobe subpleural nodularity, improved LUL GGO CT chest 04/14/13 >> peribronchovascular nodularity b/l upper lobes Rt > Lt mildly progressed, no change to mediastinal/hilar adenopathy CT chest 01/05/14 >> upper lobe predominant prebronchovascular reticulo-nodule opacities, 1.2 cm Rt paratracheal LAN, 1.6 cm subcarinal LAN, 1.7 cm Rt hilar LAN  CT chest 10/07/14 >> decreased LAN CT chest 04/26/16 >> atherosclerosis, borderline LAN, patchy nodularity b/l   Pulmonary function testing PFT 03/13/12>>FEV1  1.65 (57%), FEV1% 58, TLC 4.44 (79%), DLCO 62%, no BD PFT 03/18/13 >> FEV1 1.78 (69%), FEV1% 67, TLC 4.04 (75%), DLCO 92%, no BD PFT 01/13/14 >> FEV1 1.64 (70%), FEV1% 70, TLC 3.69 (68%), DLCO 79%  Past medical history HLD, Anxiety, Depression, Hidradenitis  Past surgical history, Family history, Social history, Allergies reviewed  Vital signs BP 124/80 (BP Location: Left Arm, Cuff Size: Normal)   Pulse 84   Ht 5\' 7"  (1.702 m)   Wt 281 lb (127.5 kg)   LMP 12/20/2016 (Approximate)   SpO2 98%   BMI 44.01 kg/m   History of Present Illness: Kaitlyn [name] is a 51 y.o. female smoker with sarcoidosis, and COPD.  She is not smoking tobacco cigarettes much anymore.  She has been vaping.  She has noticed her breathing is better.  She is not having as much cough, wheeze, or sputum.  She is able to do more activity with less breathing troubles.  She denies headache, blurred vision, sore throat, skin rash, joint pain, leg swelling, abdominal pain, or diarrhea.  She gets tingling in her right hand when she sleeps on her right side or works on Animator.  This affects her first 3 fingers on Rt and her palm.  Physical Exam:  General - pleasant Eyes - pupils reactive HEENT - no sinus tenderness, no oral exudate, wears dentures, no LAN Cardiac - regular, no murmur Chest - better air movement, no wheeze/rales Abdomen - soft, non tender Extremities - no edema Neurologic - normal strength Skin - no rashes Psychiatric - normal mood   CMP Latest Ref Rng & Units 12/28/2016 08/29/2016 02/25/2016  Glucose 65 - 99 mg/dL 80 95 83  BUN 7 - 25 mg/dL 9 13 14   Creatinine 0.50 - 1.05 mg/dL 1.61 0.96 0.45(W)  Sodium 135 - 146 mmol/L 140 139 140  Potassium 3.5 - 5.3 mmol/L 4.1 3.8 4.5  Chloride 98 - 110 mmol/L 105 101 98  CO2 20 - 31 mmol/L 32(H) 31 26  Calcium 8.6 - 10.4 mg/dL 8.8 9.8 9.6  Total Protein 6.1 - 8.1 g/dL 6.6 0.9(W) 7.6  Total Bilirubin 0.2 - 1.2 mg/dL 0.3 0.4 0.3  Alkaline Phos 33 - 130  U/L 98 126(H) 122(H)  AST 10 - 35 U/L 23 23 26   ALT 6 - 29 U/L 18 17 18     CBC Latest Ref Rng & Units 12/28/2016 08/29/2016 02/25/2016  WBC 3.8 - 10.8 K/uL 6.0 11.0(H) 5.3  Hemoglobin 11.7 - 15.5 g/dL 11.9 14.7 -  Hematocrit 35.0 - 45.0 % 37.4 40.8 43.0  Platelets 140 - 400 K/uL 232 333.0 237      Impression:  Pulmonary sarcoidosis. - continue MTX 4 pills weekly - continue folic acid and bactrim while on MTX - if her symptoms are stable to improved at next visit, then consider decreasing MTX dose  COPD with chronic bronchitis. - continue singulair, dulera and prn duoneb  Tobacco abuse and e cigarette use. - discussed how e cigarettes should be viewed as bridge to stop smoking tobacco  cigarettes - she should not fall in to the trap of replacing tobacco cigarettes long term with e cigarettes   Patient Instructions  Follow up in 4 months    Coralyn HellingVineet Kaeleb Emond, MD [facility] Pager:  [phone] 01/16/2017, 10:18 AM

## 2017-01-16 NOTE — Patient Instructions (Signed)
Follow up in 4 months 

## 2017-01-29 ENCOUNTER — Encounter: Payer: Self-pay | Admitting: Internal Medicine

## 2017-02-05 ENCOUNTER — Other Ambulatory Visit: Payer: Self-pay | Admitting: Internal Medicine

## 2017-02-05 DIAGNOSIS — F3341 Major depressive disorder, recurrent, in partial remission: Secondary | ICD-10-CM

## 2017-02-08 ENCOUNTER — Other Ambulatory Visit: Payer: Self-pay | Admitting: Internal Medicine

## 2017-02-15 ENCOUNTER — Other Ambulatory Visit: Payer: Self-pay | Admitting: Pulmonary Disease

## 2017-02-16 ENCOUNTER — Telehealth: Payer: Self-pay | Admitting: Internal Medicine

## 2017-02-16 NOTE — Telephone Encounter (Signed)
Left message for patient regarding community resource referral. VDM (DD)

## 2017-02-19 ENCOUNTER — Encounter: Payer: Self-pay | Admitting: Internal Medicine

## 2017-03-06 ENCOUNTER — Telehealth: Payer: Self-pay | Admitting: *Deleted

## 2017-03-06 DIAGNOSIS — M25552 Pain in left hip: Secondary | ICD-10-CM

## 2017-03-06 NOTE — Telephone Encounter (Signed)
Order written.  [facility] at her convenience.

## 2017-03-06 NOTE — Telephone Encounter (Signed)
Patient called and stated that she was offered an X-ray of her hip at last OV but didn't want it done. Patient stated that the hip pain has worsened and would like to have the X-ray done now. Please Advise.

## 2017-03-06 NOTE — Telephone Encounter (Signed)
Patient notified and agreed.  

## 2017-03-29 ENCOUNTER — Telehealth: Payer: Self-pay

## 2017-03-29 DIAGNOSIS — F321 Major depressive disorder, single episode, moderate: Secondary | ICD-10-CM

## 2017-03-29 MED ORDER — LEVOMILNACIPRAN HCL ER 20 & 40 MG PO C4PK: EXTENDED_RELEASE_CAPSULE | ORAL | 0 refills | Status: DC

## 2017-03-29 NOTE — Telephone Encounter (Signed)
Discussed response with patient, updated medication list (removed zoloft/sertraline)   Patient states if the [place] MedicoFetzima is too expensive she may have to go back on zoloft yet she will call and let us know to comply with keeping medication list accurate for continuity of care

## 2017-03-29 NOTE — Telephone Encounter (Signed)
Pt may stop zoloft 25mg  and then begin fetzima starter pack.  I recommend she avoid the tramadol while taking the fetzima due to possible interactions.

## 2017-03-29 NOTE — Telephone Encounter (Signed)
Patient called requesting to give Fetzima anoter try, patient tried this medication in the past (2016) and discontinued due to cost. Patient would like to see if medication is more affordable now.  Patient is currently taking Zoloft and states it makes her feel blah, not as effective.   Please advise

## 2017-04-04 ENCOUNTER — Other Ambulatory Visit: Payer: Self-pay | Admitting: Internal Medicine

## 2017-04-04 DIAGNOSIS — R635 Abnormal weight gain: Secondary | ICD-10-CM

## 2017-04-04 DIAGNOSIS — T50905A Adverse effect of unspecified drugs, medicaments and biological substances, initial encounter: Principal | ICD-10-CM

## 2017-04-20 ENCOUNTER — Other Ambulatory Visit: Payer: Self-pay | Admitting: Internal Medicine

## 2017-04-20 DIAGNOSIS — F3341 Major depressive disorder, recurrent, in partial remission: Secondary | ICD-10-CM

## 2017-04-25 ENCOUNTER — Other Ambulatory Visit: Payer: Self-pay

## 2017-04-25 NOTE — Telephone Encounter (Signed)
She cannot just switch her antidepressant back and forth so quickly. It's not safe.  Also, I thought she found the zoloft ineffective when she tried it.

## 2017-04-25 NOTE — Telephone Encounter (Signed)
Patient left message on triage voice mail. We had called in [facility] for her at her request, but it cost $350.00. She wants refill on Zoloft. Will forward note to Dr. Renato Gailseed.

## 2017-04-26 ENCOUNTER — Other Ambulatory Visit: Payer: Self-pay | Admitting: Internal Medicine

## 2017-04-26 DIAGNOSIS — F3341 Major depressive disorder, recurrent, in partial remission: Secondary | ICD-10-CM

## 2017-04-26 NOTE — Telephone Encounter (Signed)
Left message on voicemail for patient to return call when available   

## 2017-04-29 ENCOUNTER — Other Ambulatory Visit: Payer: Self-pay | Admitting: Internal Medicine

## 2017-04-29 DIAGNOSIS — F3341 Major depressive disorder, recurrent, in partial remission: Secondary | ICD-10-CM

## 2017-04-30 MED ORDER — SERTRALINE HCL 25 MG PO TABS: 25.0000 mg | ORAL_TABLET | Freq: Every day | ORAL | 1 refills | Status: DC

## 2017-04-30 NOTE — Telephone Encounter (Signed)
Patient aware rx sent to pharmacy.  

## 2017-04-30 NOTE — Telephone Encounter (Signed)
Ok with me then since she never took the fetzima, but I thought we had given samples.

## 2017-04-30 NOTE — Telephone Encounter (Signed)
Message left on clinical intake voicemail:   Patient left message again requesting to switch back to zoloft.   I spoke with patient and informed her of Dr.Reed's response. Patient states she never started Fetzima due to the cost.  Patient [facility], please advise

## 2017-05-01 ENCOUNTER — Other Ambulatory Visit: Payer: Self-pay | Admitting: Internal Medicine

## 2017-05-15 ENCOUNTER — Ambulatory Visit: Payer: Self-pay | Admitting: Pulmonary Disease

## 2017-05-17 ENCOUNTER — Ambulatory Visit (INDEPENDENT_AMBULATORY_CARE_PROVIDER_SITE_OTHER): Payer: Self-pay | Admitting: Pulmonary Disease

## 2017-05-17 ENCOUNTER — Encounter: Payer: Self-pay | Admitting: Pulmonary Disease

## 2017-05-17 ENCOUNTER — Other Ambulatory Visit (INDEPENDENT_AMBULATORY_CARE_PROVIDER_SITE_OTHER): Payer: Self-pay

## 2017-05-17 VITALS — BP 122/66 | HR 68 | Ht 67.0 in | Wt 282.6 lb

## 2017-05-17 DIAGNOSIS — Z720 Tobacco use: Secondary | ICD-10-CM

## 2017-05-17 DIAGNOSIS — J449 Chronic obstructive pulmonary disease, unspecified: Secondary | ICD-10-CM

## 2017-05-17 DIAGNOSIS — D860 Sarcoidosis of lung: Secondary | ICD-10-CM

## 2017-05-17 DIAGNOSIS — Z789 Other specified health status: Secondary | ICD-10-CM

## 2017-05-17 LAB — COMPREHENSIVE METABOLIC PANEL
ALT: 13 U/L (ref 0–35)
AST: 17 U/L (ref 0–37)
Albumin: 3.5 g/dL (ref 3.5–5.2)
Alkaline Phosphatase: 105 U/L (ref 39–117)
BILIRUBIN TOTAL: 0.3 mg/dL (ref 0.2–1.2)
BUN: 9 mg/dL (ref 6–23)
CO2: 32 meq/L (ref 19–32)
CREATININE: 1.15 mg/dL (ref 0.40–1.20)
Calcium: 9.3 mg/dL (ref 8.4–10.5)
Chloride: 105 mEq/L (ref 96–112)
GFR: 63.68 mL/min (ref >60.00)
GLUCOSE: 99 mg/dL (ref 70–99)
Potassium: 3.9 mEq/L (ref 3.5–5.1)
Sodium: 140 mEq/L (ref 135–145)
Total Protein: 7.5 g/dL (ref 6.0–8.3)

## 2017-05-17 LAB — CBC
HCT: 41.6 % (ref 36.0–46.0)
Hemoglobin: 13.5 g/dL (ref 12.0–15.0)
MCHC: 32.5 g/dL (ref 30.0–36.0)
MCV: 95.4 fl (ref 78.0–100.0)
Platelets: 201 10*3/uL (ref 150.0–400.0)
RBC: 4.35 Mil/uL (ref 3.87–5.11)
RDW: 14.8 % (ref 11.5–15.5)
WBC: 7.8 10*3/uL (ref 4.0–10.5)

## 2017-05-17 MED ORDER — MOMETASONE FURO-FORMOTEROL FUM 100-5 MCG/ACT IN AERO: 2.0000 | INHALATION_SPRAY | Freq: Two times a day (BID) | RESPIRATORY_TRACT | 0 refills | Status: DC

## 2017-05-17 MED ORDER — METHOTREXATE 2.5 MG PO TABS: ORAL_TABLET | ORAL | 0 refills | Status: DC

## 2017-05-17 NOTE — Patient Instructions (Signed)
Lab tests today  Call in one week to update status after restarting dulera  Follow up in 3 months

## 2017-05-17 NOTE — Progress Notes (Signed)
Current Outpatient Prescriptions on File Prior to Visit  Medication Sig  . acetaminophen (TYLENOL) 500 MG tablet Take 500 mg by mouth every 6 (six) hours as needed. For pain  . albuterol (PROVENTIL HFA;VENTOLIN HFA) 108 (90 BASE) MCG/ACT inhaler Inhale 2 puffs into the lungs every 4 (four) hours as needed. For shortness of breath  . buPROPion (WELLBUTRIN SR) 150 MG 12 hr tablet TAKE 1 TABLET BY MOUTH TWICE DAILY  . calcium carbonate (TUMS - DOSED IN MG ELEMENTAL CALCIUM) 500 MG chewable tablet Chew 1 tablet by mouth daily as needed. For indigestion  . cephALEXin (KEFLEX) 500 MG capsule Take 500mg  BID QOD-(Tues, thurs, sat, and sun)  . Cholecalciferol (VITAMIN D-3) 5000 units TABS Take 5 capsules by mouth once a week.   . clindamycin (CLEOCIN T) 1 % lotion Apply 1 application topically as needed.   . diclofenac sodium (VOLTAREN) 1 % GEL Apply 4 g topically 4 (four) times daily. To left hip, right knee  . Eflornithine HCl (VANIQA) 13.9 % cream Apply topically 2 (two) times daily as needed.   . folic acid (FOLVITE) 1 MG tablet TAKE 1 TABLET BY MOUTH EVERY DAY EXCEPT ON DAYS WHEN TAKING METHOTREXATE.  Marland Kitchen. GLUCOSAMINE HCL PO Take 1 tablet by mouth 2 (two) times daily.  Marland Kitchen. ipratropium-albuterol (DUONEB) 0.5-2.5 (3) MG/3ML SOLN Take 3 mLs by nebulization 2 (two) times daily as needed. For shortness of breath  . losartan-hydrochlorothiazide (HYZAAR) 100-12.5 MG tablet TAKE 1 TABLET BY MOUTH DAILY  . montelukast (SINGULAIR) 10 MG tablet TAKE ONE TABLET BY MOUTH EVERY DAY AS NEEDED FOR ALLERGIES  . phentermine (ADIPEX-P) 37.5 MG tablet Patient will need appointment for future refills. Take 1/2 tablet by mouth twice daily.  . pravastatin (PRAVACHOL) 40 MG tablet TAKE 1 TABLET BY MOUTH EVERY DAY  . pyridOXINE (VITAMIN B-6) 50 MG tablet Take 50 mg by mouth daily.  . sertraline (ZOLOFT) 25 MG tablet Take 1 tablet (25 mg total) by mouth daily.  Marland Kitchen. sulfamethoxazole-trimethoprim (BACTRIM,SEPTRA) 400-80 MG tablet TAKE  ONE TABLET BY MOUTH THREE TIMES A WEEK  . traMADol (ULTRAM) 50 MG tablet Take 1 tablet (50 mg total) by mouth every 6 (six) hours as needed for severe pain.  . TURMERIC PO Take 538 mg by mouth daily.  . vitamin B-12 (CYANOCOBALAMIN) 100 MCG tablet Take 100 mcg by mouth daily.  Marland Kitchen. zinc gluconate 50 MG tablet Take 50 mg by mouth daily.   No current facility-administered medications on file prior to visit.     Chief Complaint  Patient presents with  . Follow-up    Pt reports no change in breathing since last OV. Pt notes some heaviness in chest x 2 weeks. Pt states that she has not been using her Dulera inhaler d/t not being able to afford another Rx -- been taking PRN. Requests 90-day supply of Methotrexate.     Tests/Events EBUS with Tbx 03/01/12>>Streptococcus pneumoniae in BAL, cytology/Tbx negative 04/30/12 start prednisone Labs 04/29/13 >> Hep B S Ag negative, Hep B S Ab reactive, Hep B core Ab negative, Hep B core IgM negative, HCV Ab negative 04/30/13 Start MTX  CT chest CT chest 01/23/12>>bulky mediastinal/hilar LAN.  Scattered ill-defined upper lobe predominate perilymphatic and centrilobular nodules. CT chest 04/30/12>>No change in the appearance of the mediastinal and hilar adenopathy and pulmonary nodularity CT chest 07/31/12>>b/l hilar adenopathy no change, b/l upper lobe subpleural nodularity, improved LUL GGO CT chest 04/14/13 >> peribronchovascular nodularity b/l upper lobes Rt > Lt mildly progressed, no  change to mediastinal/hilar adenopathy CT chest 01/05/14 >> upper lobe predominant prebronchovascular reticulo-nodule opacities, 1.2 cm Rt paratracheal LAN, 1.6 cm subcarinal LAN, 1.7 cm Rt hilar LAN  CT chest 10/07/14 >> decreased LAN CT chest 04/26/16 >> atherosclerosis, borderline LAN, patchy nodularity b/l   Pulmonary function testing PFT 03/13/12>>FEV1 1.65 (57%), FEV1% 58, TLC 4.44 (79%), DLCO 62%, no BD PFT 03/18/13 >> FEV1 1.78 (69%), FEV1% 67, TLC 4.04 (75%), DLCO 92%, no  BD PFT 01/13/14 >> FEV1 1.64 (70%), FEV1% 70, TLC 3.69 (68%), DLCO 79%  Past medical history HLD, Anxiety, Depression, Hidradenitis  Past surgical history, Family history, Social history, Allergies reviewed  Vital signs BP 122/66 (BP Location: Left Arm, Cuff Size: Normal)   Pulse 68   Ht 5\' 7"  (1.702 m)   Wt 282 lb 9.6 oz (128.2 kg)   SpO2 98%   BMI 44.26 kg/m   History of Present Illness: Kaitlyn [name] is a 51 y.o. female smoker with sarcoidosis, and COPD.  She ran out of [place] about 1 month ago.  Her breathing has been worse.  She gets cough with clear sputum, and chest tightness.  She wasn't having these when using dulera.  Main issue has been her ability to afford therapies.  She still uses e cigarettes intermittently, and smokes an occasional tobacco cigarette when she gets bored.  She has notice intermittent night sweats.  Denies fever, skin rash, leg swelling, or hemoptysis.  Physical Exam:  General - pleasant Eyes - pupils reactive ENT - no sinus tenderness, no oral exudate, no LAN Cardiac - regular, no murmur Chest - faint wheeze Lt > Rt that partially clears with coughing Abd - soft, non tender Ext - no edema Skin - no rashes Neuro - normal strength Psych - normal mood    CMP Latest Ref Rng & Units 12/28/2016 08/29/2016 02/25/2016  Glucose 65 - 99 mg/dL 80 95 83  BUN 7 - 25 mg/dL 9 13 14   Creatinine 0.50 - 1.05 mg/dL 1.61 0.96 0.45(W)  Sodium 135 - 146 mmol/L 140 139 140  Potassium 3.5 - 5.3 mmol/L 4.1 3.8 4.5  Chloride 98 - 110 mmol/L 105 101 98  CO2 20 - 31 mmol/L 32(H) 31 26  Calcium 8.6 - 10.4 mg/dL 8.8 9.8 9.6  Total Protein 6.1 - 8.1 g/dL 6.6 0.9(W) 7.6  Total Bilirubin 0.2 - 1.2 mg/dL 0.3 0.4 0.3  Alkaline Phos 33 - 130 U/L 98 126(H) 122(H)  AST 10 - 35 U/L 23 23 26   ALT 6 - 29 U/L 18 17 18     CBC Latest Ref Rng & Units 12/28/2016 08/29/2016 02/25/2016  WBC 3.8 - 10.8 K/uL 6.0 11.0(H) 5.3  Hemoglobin 11.7 - 15.5 g/dL 11.9 14.7 82.9  Hematocrit  35.0 - 45.0 % 37.4 40.8 43.0  Platelets 140 - 400 K/uL 232 333.0 237      Impression:  Pulmonary sarcoidosis. - will continue MTX 4 pills weekly for now - if her recent symptoms improve after restarting dulera, then might be able to start slowly tapering MTX - continue folic acid an bactrim while on MTX - defer CT chest and PFT unless her symptoms don't improve with dulera - lab tests today  COPD with chronic bronchitis. - resume dulera >> samples given - continue singulair and prn albuterol  Tobacco abuse and e cigarette use. - encouraged her to continue her smoking cessation efforts   Patient Instructions  Lab tests today  Call in one week to update status after restarting  dulera  Follow up in 3 months    [name], MD [facility] Pulmonary/Critical Care Pager:  [phone] 05/17/2017, 12:53 PM

## 2017-05-18 ENCOUNTER — Telehealth: Payer: Self-pay | Admitting: Pulmonary Disease

## 2017-05-18 NOTE — Telephone Encounter (Signed)
lmtcb x1 for pt. 

## 2017-05-18 NOTE — Telephone Encounter (Signed)
CMP Latest Ref Rng & Units 05/17/2017 12/28/2016 08/29/2016  Glucose 70 - 99 mg/dL 99 80 95  BUN 6 - 23 mg/dL 9 9 13   Creatinine 0.40 - 1.20 mg/dL 1.611.15 0.961.02 0.451.14  Sodium 135 - 145 mEq/L 140 140 139  Potassium 3.5 - 5.1 mEq/L 3.9 4.1 3.8  Chloride 96 - 112 mEq/L 105 105 101  CO2 19 - 32 mEq/L 32 32(H) 31  Calcium 8.4 - 10.5 mg/dL 9.3 8.8 9.8  Total Protein 6.0 - 8.3 g/dL 7.5 6.6 4.0(J8.5(H)  Total Bilirubin 0.2 - 1.2 mg/dL 0.3 0.3 0.4  Alkaline Phos 39 - 117 U/L 105 98 126(H)  AST 0 - 37 U/L 17 23 23   ALT 0 - 35 U/L 13 18 17     CBC Latest Ref Rng & Units 05/17/2017 12/28/2016 08/29/2016  WBC 4.0 - 10.5 K/uL 7.8 6.0 11.0(H)  Hemoglobin 12.0 - 15.0 g/dL 81.113.5 91.411.9 78.213.9  Hematocrit 36.0 - 46.0 % 41.6 37.4 40.8  Platelets 150.0 - 400.0 K/uL 201.0 232 333.0     Please inform her that her labs are normal.

## 2017-05-18 NOTE — Telephone Encounter (Signed)
Pt called back and she is aware of results of labs per VS>

## 2017-06-25 ENCOUNTER — Other Ambulatory Visit: Payer: Self-pay | Admitting: *Deleted

## 2017-06-25 MED ORDER — BUPROPION HCL ER (SR) 150 MG PO TB12: 150.0000 mg | ORAL_TABLET | Freq: Two times a day (BID) | ORAL | 1 refills | Status: DC

## 2017-06-25 NOTE — Telephone Encounter (Signed)
[facility] 

## 2017-07-05 ENCOUNTER — Ambulatory Visit: Payer: Self-pay | Admitting: Internal Medicine

## 2017-07-12 ENCOUNTER — Ambulatory Visit: Payer: Self-pay | Admitting: Internal Medicine

## 2017-08-16 ENCOUNTER — Ambulatory Visit: Payer: Self-pay | Admitting: Pulmonary Disease

## 2017-09-06 ENCOUNTER — Ambulatory Visit: Payer: Self-pay | Admitting: Internal Medicine

## 2017-09-19 ENCOUNTER — Other Ambulatory Visit: Payer: Self-pay | Admitting: Pulmonary Disease

## 2017-09-24 ENCOUNTER — Ambulatory Visit (INDEPENDENT_AMBULATORY_CARE_PROVIDER_SITE_OTHER): Payer: Self-pay | Admitting: Pulmonary Disease

## 2017-09-24 ENCOUNTER — Encounter: Payer: Self-pay | Admitting: Pulmonary Disease

## 2017-09-24 ENCOUNTER — Other Ambulatory Visit (INDEPENDENT_AMBULATORY_CARE_PROVIDER_SITE_OTHER): Payer: Self-pay

## 2017-09-24 VITALS — BP 120/74 | HR 56 | Ht 67.0 in | Wt 287.0 lb

## 2017-09-24 DIAGNOSIS — Z789 Other specified health status: Secondary | ICD-10-CM

## 2017-09-24 DIAGNOSIS — Z6841 Body Mass Index (BMI) 40.0 and over, adult: Secondary | ICD-10-CM

## 2017-09-24 DIAGNOSIS — R0609 Other forms of dyspnea: Secondary | ICD-10-CM

## 2017-09-24 DIAGNOSIS — D860 Sarcoidosis of lung: Secondary | ICD-10-CM

## 2017-09-24 DIAGNOSIS — J449 Chronic obstructive pulmonary disease, unspecified: Secondary | ICD-10-CM

## 2017-09-24 DIAGNOSIS — Z23 Encounter for immunization: Secondary | ICD-10-CM

## 2017-09-24 LAB — CBC
HEMATOCRIT: 40.9 % (ref 36.0–46.0)
Hemoglobin: 13.5 g/dL (ref 12.0–15.0)
MCHC: 32.9 g/dL (ref 30.0–36.0)
MCV: 94.1 fl (ref 78.0–100.0)
Platelets: 236 10*3/uL (ref 150.0–400.0)
RBC: 4.35 Mil/uL (ref 3.87–5.11)
RDW: 15.2 % (ref 11.5–15.5)
WBC: 8.4 10*3/uL (ref 4.0–10.5)

## 2017-09-24 LAB — COMPREHENSIVE METABOLIC PANEL
ALT: 16 U/L (ref 0–35)
AST: 21 U/L (ref 0–37)
Albumin: 3.7 g/dL (ref 3.5–5.2)
Alkaline Phosphatase: 124 U/L — ABNORMAL HIGH (ref 39–117)
BUN: 10 mg/dL (ref 6–23)
CHLORIDE: 99 meq/L (ref 96–112)
CO2: 31 mEq/L (ref 19–32)
Calcium: 9.8 mg/dL (ref 8.4–10.5)
Creatinine, Ser: 1.11 mg/dL (ref 0.40–1.20)
GFR: 66.25 mL/min (ref >60.00)
GLUCOSE: 105 mg/dL — AB (ref 70–99)
POTASSIUM: 4 meq/L (ref 3.5–5.1)
SODIUM: 138 meq/L (ref 135–145)
Total Bilirubin: 0.5 mg/dL (ref 0.2–1.2)
Total Protein: 7.7 g/dL (ref 6.0–8.3)

## 2017-09-24 MED ORDER — MOMETASONE FURO-FORMOTEROL FUM 200-5 MCG/ACT IN AERO: 2.0000 | INHALATION_SPRAY | Freq: Two times a day (BID) | RESPIRATORY_TRACT | 11 refills | Status: DC

## 2017-09-24 NOTE — Patient Instructions (Signed)
Will change to higher dose of dulera.  Continue using two puffs twice per day.  Lab tests today.  Follow up in 6 months.

## 2017-09-24 NOTE — Progress Notes (Signed)
Current Outpatient Medications on File Prior to Visit  Medication Sig  . acetaminophen (TYLENOL) 500 MG tablet Take 500 mg by mouth every 6 (six) hours as needed. For pain  . albuterol (PROVENTIL HFA;VENTOLIN HFA) 108 (90 BASE) MCG/ACT inhaler Inhale 2 puffs into the lungs every 4 (four) hours as needed. For shortness of breath  . buPROPion (WELLBUTRIN SR) 150 MG 12 hr tablet Take 1 tablet (150 mg total) by mouth 2 (two) times daily.  . calcium carbonate (TUMS - DOSED IN MG ELEMENTAL CALCIUM) 500 MG chewable tablet Chew 1 tablet by mouth daily as needed. For indigestion  . cephALEXin (KEFLEX) 500 MG capsule Take 500mg  BID QOD-(Tues, thurs, sat, and sun)  . Cholecalciferol (VITAMIN D-3) 5000 units TABS Take 5 capsules by mouth once a week.   . clindamycin (CLEOCIN T) 1 % lotion Apply 1 application topically as needed.   . diclofenac sodium (VOLTAREN) 1 % GEL Apply 4 g topically 4 (four) times daily. To left hip, right knee  . Eflornithine HCl (VANIQA) 13.9 % cream Apply topically 2 (two) times daily as needed.   . folic acid (FOLVITE) 1 MG tablet TAKE 1 TABLET BY MOUTH EVERY DAY EXCEPT ON DAYS WHEN TAKING METHOTREXATE.  Marland Kitchen. GLUCOSAMINE HCL PO Take 1 tablet by mouth 2 (two) times daily.  Marland Kitchen. ipratropium-albuterol (DUONEB) 0.5-2.5 (3) MG/3ML SOLN Take 3 mLs by nebulization 2 (two) times daily as needed. For shortness of breath  . losartan-hydrochlorothiazide (HYZAAR) 100-12.5 MG tablet TAKE 1 TABLET BY MOUTH DAILY  . methotrexate (RHEUMATREX) 2.5 MG tablet TAKE 4 TABLETS BY MOUTH 1 TIME A WEEK  . montelukast (SINGULAIR) 10 MG tablet TAKE ONE TABLET BY MOUTH EVERY DAY AS NEEDED FOR ALLERGIES  . phentermine (ADIPEX-P) 37.5 MG tablet Patient will need appointment for future refills. Take 1/2 tablet by mouth twice daily.  . pravastatin (PRAVACHOL) 40 MG tablet TAKE 1 TABLET BY MOUTH EVERY DAY  . pyridOXINE (VITAMIN B-6) 50 MG tablet Take 50 mg by mouth daily.  . sertraline (ZOLOFT) 25 MG tablet Take 1  tablet (25 mg total) by mouth daily.  Marland Kitchen. sulfamethoxazole-trimethoprim (BACTRIM,SEPTRA) 400-80 MG tablet TAKE 1 TABLET BY MOUTH THREE TIMES A WEEK  . traMADol (ULTRAM) 50 MG tablet Take 1 tablet (50 mg total) by mouth every 6 (six) hours as needed for severe pain.  . TURMERIC PO Take 538 mg by mouth daily.  . vitamin B-12 (CYANOCOBALAMIN) 100 MCG tablet Take 100 mcg by mouth daily.  Marland Kitchen. zinc gluconate 50 MG tablet Take 50 mg by mouth daily.   No current facility-administered medications on file prior to visit.     Chief Complaint  Patient presents with  . Follow-up    Pt states SOB with exertion and wheezing has worsen since last visit. Pt has chest pain and tightness at pain rate of 5 out of 10.     Tests/Events EBUS with Tbx 03/01/12>>Streptococcus pneumoniae in BAL, cytology/Tbx negative 04/30/12 start prednisone Labs 04/29/13 >> Hep B S Ag negative, Hep B S Ab reactive, Hep B core Ab negative, Hep B core IgM negative, HCV Ab negative 04/30/13 Start MTX  CT chest CT chest 01/23/12>>bulky mediastinal/hilar LAN.  Scattered ill-defined upper lobe predominate perilymphatic and centrilobular nodules. CT chest 04/30/12>>No change in the appearance of the mediastinal and hilar adenopathy and pulmonary nodularity CT chest 07/31/12>>b/l hilar adenopathy no change, b/l upper lobe subpleural nodularity, improved LUL GGO CT chest 04/14/13 >> peribronchovascular nodularity b/l upper lobes Rt > Lt mildly progressed,  no change to mediastinal/hilar adenopathy CT chest 01/05/14 >> upper lobe predominant prebronchovascular reticulo-nodule opacities, 1.2 cm Rt paratracheal LAN, 1.6 cm subcarinal LAN, 1.7 cm Rt hilar LAN  CT chest 10/07/14 >> decreased LAN CT chest 04/26/16 >> atherosclerosis, borderline LAN, patchy nodularity b/l   Pulmonary function testing PFT 03/13/12>>FEV1 1.65 (57%), FEV1% 58, TLC 4.44 (79%), DLCO 62%, no BD PFT 03/18/13 >> FEV1 1.78 (69%), FEV1% 67, TLC 4.04 (75%), DLCO 92%, no BD PFT  01/13/14 >> FEV1 1.64 (70%), FEV1% 70, TLC 3.69 (68%), DLCO 79%  Past medical history HLD, Anxiety, Depression, Hidradenitis  Past surgical history, Family history, Social history, Allergies reviewed  Vital signs BP 120/74 (BP Location: Left Arm, Cuff Size: Normal)   Pulse (!) 56   Ht 5\' 7"  (1.702 m)   Wt 287 lb (130.2 kg)   SpO2 100%   BMI 44.95 kg/m   History of Present Illness: Kaitlyn [name] Stetzel is a 51 [name].o. female smoker with sarcoidosis, and COPD.  She has noticed more trouble with dyspnea with activity.  This especially happens at work.  She takes about 20 minutes to recover.  She has to use albuterol frequently and this helps.  She is using singulair and dulera.  She gets occasional cough and wheeze.  She isn't usually bringing up sputum.  She denies fever, hemoptysis, sinus congestion, sore throat, skin rash, or leg swelling.  She gets soreness in her chest with deep breathing.  She isn't smoking tobacco cigarettes, but has been using electronic cigarettes.  She travels to [place] frequently.  She notices her breathing is better there compared to when she is in [place].  Physical Exam:  General - pleasant Eyes - pupils reactive ENT - no sinus tenderness, no oral exudate, no LAN, wears dentures Cardiac - regular, no murmur Chest - no wheeze, rales Abd - soft, non tender Ext - no edema Skin - no rashes Neuro - normal strength Psych - normal mood    CMP Latest Ref Rng & Units 05/17/2017 12/28/2016 08/29/2016  Glucose 70 - 99 mg/dL 99 80 95  BUN 6 - 23 mg/dL 9 9 13   Creatinine 0.40 - 1.20 mg/dL 1.611.15 0.961.02 0.451.14  Sodium 135 - 145 mEq/L 140 140 139  Potassium 3.5 - 5.1 mEq/L 3.9 4.1 3.8  Chloride 96 - 112 mEq/L 105 105 101  CO2 19 - 32 mEq/L 32 32(H) 31  Calcium 8.4 - 10.5 mg/dL 9.3 8.8 9.8  Total Protein 6.0 - 8.3 g/dL 7.5 6.6 4.0(J8.5(H)  Total Bilirubin 0.2 - 1.2 mg/dL 0.3 0.3 0.4  Alkaline Phos 39 - 117 U/L 105 98 126(H)  AST 0 - 37 U/L 17 23 23   ALT 0 - 35 U/L 13  18 17     CBC Latest Ref Rng & Units 05/17/2017 12/28/2016 08/29/2016  WBC 4.0 - 10.5 K/uL 7.8 6.0 11.0(H)  Hemoglobin 12.0 - 15.0 g/dL 81.113.5 91.411.9 78.213.9  Hematocrit 36.0 - 46.0 % 41.6 37.4 40.8  Platelets 150.0 - 400.0 K/uL 201.0 232 333.0    Discussion: She continues to have dyspnea on exertion.  She isn't having as much cough, or wheeze.  She is using e cigarettes.  Impression:  Pulmonary sarcoidosis. - continue MTX 4 pills weekly with folic acid - bactrim prophylaxis  - if her breathing doesn't improve with higher dose dulera, then need to do additional testing >> PFT, CT chest, Echo - she is concerned about expense of additional testing and prefers to limit this as able -  will repeat CMET, CBC today - flu shot, and prevnar today  COPD with chronic bronchitis. - will change to dulera 200 strength - continue singulair and prn albuterol  Tobacco abuse and e cigarette use. - advised her that neither option is good for her, and that she shouldn't use either  Obesity. - certainly her weight could be contributing to her respiratory symptoms   Patient Instructions  Will change to higher dose of dulera.  Continue using two puffs twice per day.  Lab tests today.  Follow up in 6 months.   [name], MD [place] Pulmonary/Critical Care Pager:  [phone] 09/24/2017, 4:17 PM

## 2017-09-26 ENCOUNTER — Telehealth: Payer: Self-pay | Admitting: Pulmonary Disease

## 2017-09-26 NOTE — Telephone Encounter (Signed)
Left voice mail on machine for patient to return phone call back regarding results.  Will follow up again with patient at later date.

## 2017-09-26 NOTE — Telephone Encounter (Signed)
CMP Latest Ref Rng & Units 09/24/2017 05/17/2017 12/28/2016  Glucose 70 - 99 mg/dL 409(W105(H) 99 80  BUN 6 - 23 mg/dL 10 9 9   Creatinine 0.40 - 1.20 mg/dL 1.191.11 1.471.15 8.291.02  Sodium 135 - 145 mEq/L 138 140 140  Potassium 3.5 - 5.1 mEq/L 4.0 3.9 4.1  Chloride 96 - 112 mEq/L 99 105 105  CO2 19 - 32 mEq/L 31 32 32(H)  Calcium 8.4 - 10.5 mg/dL 9.8 9.3 8.8  Total Protein 6.0 - 8.3 g/dL 7.7 7.5 6.6  Total Bilirubin 0.2 - 1.2 mg/dL 0.5 0.3 0.3  Alkaline Phos 39 - 117 U/L 124(H) 105 98  AST 0 - 37 U/L 21 17 23   ALT 0 - 35 U/L 16 13 18     CBC Latest Ref Rng & Units 09/24/2017 05/17/2017 12/28/2016  WBC 4.0 - 10.5 K/uL 8.4 7.8 6.0  Hemoglobin 12.0 - 15.0 g/dL 56.213.5 13.013.5 86.511.9  Hematocrit 36.0 - 46.0 % 40.9 41.6 37.4  Platelets 150.0 - 400.0 K/uL 236.0 201.0 232     Will have my nurse inform pt that labs were okay.

## 2017-09-28 NOTE — Telephone Encounter (Signed)
Left voice mail on machine for patient to return phone call back regarding results.

## 2017-09-30 ENCOUNTER — Other Ambulatory Visit: Payer: Self-pay | Admitting: Internal Medicine

## 2017-09-30 DIAGNOSIS — T50905A Adverse effect of unspecified drugs, medicaments and biological substances, initial encounter: Principal | ICD-10-CM

## 2017-09-30 DIAGNOSIS — R635 Abnormal weight gain: Secondary | ICD-10-CM

## 2017-10-01 NOTE — Telephone Encounter (Signed)
Ok to fill? Next appt 10/25/2017, pt has canceled last appts

## 2017-10-01 NOTE — Telephone Encounter (Signed)
Give enough to get to appointment only.  Needs to be seen.

## 2017-10-01 NOTE — Telephone Encounter (Signed)
Gave 15 tablets pt takes 1/2 tablet daily rx called into pharmacy

## 2017-10-04 NOTE — Telephone Encounter (Signed)
Left voice mail on machine for patient to return phone call back regarding results and recommendations. Mailed letter today.

## 2017-10-05 ENCOUNTER — Telehealth: Payer: Self-pay | Admitting: Pulmonary Disease

## 2017-10-05 NOTE — Telephone Encounter (Signed)
Spoke with pt and advised lab results. Pt understood and nothing further is needed.

## 2017-10-17 ENCOUNTER — Telehealth: Payer: Self-pay | Admitting: Pulmonary Disease

## 2017-10-17 NOTE — Telephone Encounter (Signed)
Spoke with patient. Informed her that Dr. Craige CottaSood placed her on Dulera 200. Advised patient that we currently do not have any samples of Dulera at this time. Advised her to call back next week to check on status.   Patient verbalized understanding. Nothing else needed at time of call.

## 2017-10-18 ENCOUNTER — Emergency Department (HOSPITAL_COMMUNITY)
Admission: EM | Admit: 2017-10-18 | Discharge: 2017-10-18 | Disposition: A | Discharge status: Home / Self Care | Payer: Self-pay | Attending: Emergency Medicine | Admitting: Emergency Medicine

## 2017-10-18 ENCOUNTER — Emergency Department (HOSPITAL_COMMUNITY): Payer: Self-pay

## 2017-10-18 ENCOUNTER — Encounter (HOSPITAL_COMMUNITY): Payer: Self-pay | Admitting: Emergency Medicine

## 2017-10-18 DIAGNOSIS — F17210 Nicotine dependence, cigarettes, uncomplicated: Secondary | ICD-10-CM | POA: Insufficient documentation

## 2017-10-18 DIAGNOSIS — S92514A Nondisplaced fracture of proximal phalanx of right lesser toe(s), initial encounter for closed fracture: Secondary | ICD-10-CM | POA: Insufficient documentation

## 2017-10-18 DIAGNOSIS — Y92013 Bedroom of single-family (private) house as the place of occurrence of the external cause: Secondary | ICD-10-CM | POA: Insufficient documentation

## 2017-10-18 DIAGNOSIS — J449 Chronic obstructive pulmonary disease, unspecified: Secondary | ICD-10-CM | POA: Insufficient documentation

## 2017-10-18 DIAGNOSIS — Y999 Unspecified external cause status: Secondary | ICD-10-CM | POA: Insufficient documentation

## 2017-10-18 DIAGNOSIS — I10 Essential (primary) hypertension: Secondary | ICD-10-CM | POA: Insufficient documentation

## 2017-10-18 DIAGNOSIS — Y939 Activity, unspecified: Secondary | ICD-10-CM | POA: Insufficient documentation

## 2017-10-18 DIAGNOSIS — W2209XA Striking against other stationary object, initial encounter: Secondary | ICD-10-CM | POA: Insufficient documentation

## 2017-10-18 DIAGNOSIS — Z79899 Other long term (current) drug therapy: Secondary | ICD-10-CM | POA: Insufficient documentation

## 2017-10-18 DIAGNOSIS — J45909 Unspecified asthma, uncomplicated: Secondary | ICD-10-CM | POA: Insufficient documentation

## 2017-10-18 NOTE — Discharge Instructions (Signed)
Please read and follow all provided instructions.  You have been seen today for an injury to your right 4th toe.   Tests performed today include: An x-ray of the affected area showed a Nondisplaced fracture involving the proximal shaft of the 4th proximal phalanx, X-ray report is below CLINICAL DATA:  Pain and swelling to right 4th toe following injury on 10/17/2017, history of right 5th digit fracture  EXAM: RIGHT FOOT COMPLETE - 3+ VIEW  COMPARISON:  12/30/2012  FINDINGS: Nondisplaced oblique fracture involving the proximal shaft of the 4th proximal phalanx.  Chronic fracture deformity involving the distal aspect of the 5th proximal phalanx.  The joint spaces are preserved.  Visualized soft tissues are within normal limits.  IMPRESSION: Nondisplaced fracture involving the proximal shaft of the 4th proximal phalanx. Vital signs. See below for your results today.   Home care instructions: -- *PRICE in the first 24-48 hours after injury: Protect (with brace, splint, sling), if given by your provider Rest Ice- Do not apply ice pack directly to your skin, place towel or similar between your skin and ice/ice pack. Apply ice for 20 min, then remove for 40 min while awake Compression- Wear brace, elastic bandage, splint as directed by your provider Elevate affected extremity above the level of your heart when not walking around for the first 24-48 hours   Use Ibuprofen (Motrin/Advil) 600mg  every 6 hours as needed for pain (do not exceed max dose in 24 hours, 2400mg )  This fracture will likely take 4-6 weeks to heal.  Follow-up instructions: Please follow-up with your the provided orthopedic physician (bone specialist) if you continue to have significant pain in 1 week. In this case you may have a more severe injury that requires further care.   Return instructions:  Please return if your toes or feet are numb or tingling, appear gray or blue, or you have severe pain (also  elevate the leg and loosen splint or wrap if you were given one) Please return to the Emergency Department if you experience worsening symptoms.  Please return if you have any other emergent concerns. Additional Information:  Your vital signs today were: BP (!) 167/80 (BP Location: Right Arm)    Pulse 64    Temp 98.5 F (36.9 C) (Oral)    Resp 16    Ht 5\' 7"  (1.702 m)    Wt 131.5 kg (290 lb)    SpO2 97%    BMI 45.42 kg/m  If your blood pressure (BP) was elevated above 135/85 this visit, please have this repeated by your doctor within one month. ---------------

## 2017-10-18 NOTE — ED Triage Notes (Signed)
Patient reports bumping right foot into bedside commonde last night. Patient having pain on 4th toe. Pt has boot on from prior injury. Pt ambulatory. Pain 1/10.

## 2017-10-18 NOTE — ED Provider Notes (Signed)
[facility]-EMERGENCY DEPT Provider Note   CSN: [id number] Arrival date & time: 10/18/17  1423     History   Chief Complaint Chief Complaint  Patient presents with  . Toe Injury    HPI Kaitlyn [name] is a 51 y.o. female who presents to the emergency department with complaints of right fourth toe pain status post injury last night.  Patient states that she bumped her right foot on a bedside commode last night.  Had immediate pain to the right fourth toe, this has been improving.  States at present she is not having any pain unless she moves the toe, bears weight, or someone presses on it.  Describes the pain as achy.  Has not tried any at home interventions.  No other alleviating or aggravating factors.  States she has had multiple broken toes in the past, this feels similar.  Denies numbness, weakness, open wound, or any other injury.  HPI  Past Medical History:  Diagnosis Date  . Anxiety   . Asthma    inhalers   . Carpal tunnel syndrome 01/2015   right  . 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 Depression   . Extrinsic asthma, unspecified   . Hidradenitis   . Hyperlipidemia    takes pravachol  . Hypertension   . Keloid scar   . Obesity   . Osteoarthritis   . Osteoarthrosis, unspecified whether generalized or localized, unspecified site   . PONV (postoperative nausea and vomiting)   . 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 Sarcoidosis   . Shortness of breath    exertion  . Wears dentures    top    Patient Active Problem List   Diagnosis Date Noted  . Recurrent major depressive disorder, in partial remission (HCC) 02/28/2016  . Morbid obesity due to excess  calories (HCC) 02/28/2016  . Benign essential HTN 02/28/2016  . Leg pain, left 12/04/2013  . Onycholysis 04/07/2013  . Hyperlipidemia   . COPD with chronic bronchitis 03/13/2012  . Pulmonary sarcoidosis 02/22/2012    Past Surgical History:  Procedure Laterality Date  . MULTIPLE TOOTH EXTRACTIONS  2010  . NASAL TURBINATE REDUCTION Bilateral 06/29/2009   inferior  . OPEN REDUCTION NASAL FRACTURE  06/29/2009   with exc. bx. left intranasal papilloma  . OTHER SURGICAL HISTORY  2010   cyst removal from right middle finger    . REMOVAL OF GROWTH FROM FINGER     DR Metro Kung  . VIDEO BRONCHOSCOPY WITH ENDOBRONCHIAL ULTRASOUND  03/01/2012    OB History    No data available       Home Medications    Prior to Admission medications   Medication Sig Start Date End Date Taking? Authorizing Provider  acetaminophen (TYLENOL) 500 MG tablet Take 500 mg by mouth every 6 (six) hours as needed. For pain    [provider]  albuterol (PROVENTIL HFA;VENTOLIN HFA) 108 (90 BASE) MCG/ACT inhaler Inhale 2 puffs into the lungs every 4 (four) hours as needed. For shortness of breath 11/10/14   Reed, Tiffany L, DO  buPROPion (WELLBUTRIN SR) 150 MG 12 hr tablet Take 1 tablet (150 mg total) by mouth 2 (two) times daily. 06/25/17   Kermit Balo,  DO  calcium carbonate (TUMS - DOSED IN MG ELEMENTAL CALCIUM) 500 MG chewable tablet Chew 1 tablet by mouth daily as needed. For indigestion    [provider]  cephALEXin (KEFLEX) 500 MG capsule Take 500mg  BID QOD-(Tues, thurs, sat, and sun)    [provider]  Cholecalciferol (VITAMIN D-3) 5000 units TABS Take 5 capsules by mouth once a week.     [provider]  clindamycin (CLEOCIN T) 1 % lotion Apply 1 application topically as needed.     [provider]  diclofenac sodium (VOLTAREN) 1 % GEL Apply 4 g topically 4 (four) times daily. To left hip, right knee 12/28/16   Reed, Tiffany L, DO  Eflornithine HCl (VANIQA) 13.9 %  cream Apply topically 2 (two) times daily as needed.     [provider]  folic acid (FOLVITE) 1 MG tablet TAKE 1 TABLET BY MOUTH EVERY DAY EXCEPT ON DAYS WHEN TAKING METHOTREXATE. 10/02/16   Reed, Tiffany L, DO  GLUCOSAMINE HCL PO Take 1 tablet by mouth 2 (two) times daily.    [provider]  ipratropium-albuterol (DUONEB) 0.5-2.5 (3) MG/3ML SOLN Take 3 mLs by nebulization 2 (two) times daily as needed. For shortness of breath 09/30/14   Coralyn HellingSood, Vineet, MD  losartan-hydrochlorothiazide [facility](HYZAAR) 100-12.5 MG tablet TAKE 1 TABLET BY MOUTH DAILY 10/02/16   Reed, Tiffany L, DO  methotrexate (RHEUMATREX) 2.5 MG tablet TAKE 4 TABLETS BY MOUTH 1 TIME A WEEK 05/17/17   Coralyn HellingSood, Vineet, MD  mometasone-formoterol [facility](DULERA) 200-5 MCG/ACT AERO Inhale 2 puffs into the lungs 2 (two) times daily. 09/24/17   Coralyn HellingSood, Vineet, MD  montelukast (SINGULAIR) 10 MG tablet TAKE ONE TABLET BY MOUTH EVERY DAY AS NEEDED FOR ALLERGIES 05/01/17   Reed, Tiffany L, DO  phentermine (ADIPEX-P) 37.5 MG tablet TAKE 1/2 TABLET BY MOUTH TWICE DAILY 10/01/17   Reed, Tiffany L, DO  pravastatin (PRAVACHOL) 40 MG tablet TAKE 1 TABLET BY MOUTH EVERY DAY 05/01/17   Reed, Tiffany L, DO  pyridOXINE (VITAMIN B-6) 50 MG tablet Take 50 mg by mouth daily.    [provider]  sertraline (ZOLOFT) 25 MG tablet Take 1 tablet (25 mg total) by mouth daily. 04/30/17   Reed, Tiffany L, DO  sulfamethoxazole-trimethoprim (BACTRIM,SEPTRA) 400-80 MG tablet TAKE 1 TABLET BY MOUTH THREE TIMES A WEEK 09/19/17   Coralyn HellingSood, Vineet, MD  traMADol (ULTRAM) 50 MG tablet Take 1 tablet (50 mg total) by mouth every 6 (six) hours as needed for severe pain. 12/28/16   Reed, Tiffany L, DO  TURMERIC PO Take 538 mg by mouth daily.    [provider]  vitamin B-12 (CYANOCOBALAMIN) 100 MCG tablet Take 100 mcg by mouth daily.    [provider]  zinc gluconate 50 MG tablet Take 50 mg by mouth daily.    [provider]    Family History Family  History  Problem Relation Age of Onset  . Cancer Maternal Aunt        mother's aunt  . Diabetes Maternal Aunt     Social History Social History   Tobacco Use  . Smoking status: Current Every Day Smoker    Packs/day: 1.00    Years: 35.00    Pack years: 35.00    Types: Cigarettes, E-cigarettes    Last attempt to quit: 01/22/2012    Years since quitting: 5.7  . Smokeless tobacco: Never Used  . Tobacco comment: using ecig daily--06/21/16  Substance Use Topics  . Alcohol use: Yes  Comment: occasionally  . Drug use: No     Allergies   Codeine; Cyclinex [tetracycline hcl]; Milk thistle; and Morphine and related   Review of Systems Review of Systems  Constitutional: Negative for chills and fever.  Eyes: Negative for visual disturbance.  Respiratory: Negative for chest tightness and shortness of breath.   Cardiovascular: Negative for chest pain and palpitations.  Musculoskeletal:       R 4th toe pain  Neurological: Negative for weakness, numbness and headaches.     Physical Exam Updated Vital Signs BP (!) 167/80 (BP Location: Right Arm)   Pulse 64   Temp 98.5 F (36.9 C) (Oral)   Resp 16   Ht 5\' 7"  (1.702 m)   Wt 131.5 kg (290 lb)   SpO2 97%   BMI 45.42 kg/m   Physical Exam  Constitutional: She appears well-developed and well-nourished. No distress.  HENT:  Head: Normocephalic and atraumatic.  Eyes: Conjunctivae are normal. Right eye exhibits no discharge. Left eye exhibits no discharge.  Cardiovascular:  Pulses:      Popliteal pulses are 2+ on the right side, and 2+ on the left side.  Musculoskeletal:  Lower extremities: No obvious deformity, erythema, ecchymosis, open wound, or warmth.  Patient has full range of motion of the lower extremity digits and ankles.  Tenderness to palpation over right fourth phalange, DIP, PIP, and extending back to metatarsal.  No other bony tenderness, no malleolar tenderness, no navicular tenderness, no tenderness at the base of  the fifth.  Neurological: She is alert.  Clear speech.Bilateral lower extremities' sensation intact to sharp and dull touch.  5/5 plantar and dorsi flexion strength bilaterally. Gait is intact, antalgic.   Skin: Capillary refill takes less than 2 seconds.  Psychiatric: She has a normal mood and affect. Her behavior is normal. Thought content normal.  Nursing note and vitals reviewed.    ED Treatments / Results  Labs (all labs ordered are listed, but only abnormal results are displayed) Labs Reviewed - No data to display  EKG  EKG Interpretation None       Radiology Dg Foot Complete Right  Result Date: 10/18/2017 CLINICAL DATA:  Pain and swelling to right 4th toe following injury on 10/17/2017, history of right 5th digit fracture EXAM: RIGHT FOOT COMPLETE - 3+ VIEW COMPARISON:  12/30/2012 FINDINGS: Nondisplaced oblique fracture involving the proximal shaft of the 4th proximal phalanx. Chronic fracture deformity involving the distal aspect of the 5th proximal phalanx. The joint spaces are preserved. Visualized soft tissues are within normal limits. IMPRESSION: Nondisplaced fracture involving the proximal shaft of the 4th proximal phalanx. Electronically Signed   By: Charline Bills M.D.   On: 10/18/2017 16:21    Procedures Procedures (including critical care time)  Medications Ordered in ED Medications - No data to display   Initial Impression / Assessment and Plan / ED Course  I have reviewed the triage vital signs and the nursing notes.  Pertinent labs & imaging results that were available during my care of the patient were reviewed by me and considered in my medical decision making (see chart for details).    Patient presents with right fourth toe pain following an injury last night.  Patient is nontoxic-appearing in no apparent distress with stable vital signs-blood pressure initially elevated in the emergency department, this somewhat improved.  X-ray revealed a  nondisplaced fracture of the proximal shaft of the fourth proximal phalanx of the right foot.  Patient is neurovascularly intact distally.  Affected  digit was buddy taped.  Patient in postop shoe which she had brought with her from previous toe fracture.  Recommended Tylenol and ibuprofen for pain, patient also has prescription for tramadol due to chronic knee problems.  I discussed results, treatment plan, need for orthopedic follow-up, and return precautions with the patient.  Explained that this injury would likely take 4-6 weeks to heal.  Also instructed patient to take her blood pressure medication as prescribed and follow-up with her primary care provider for a recheck of this to see if her blood pressure medication needs to be adjusted.  Discussed symptoms of hypertensive emergency and reasons to return to the emergency department, patient asymptomatic at time of discharge (No HA, numbness, weakness, visual changes, CP, or dyspnea). Provided opportunity for questions, patient confirmed understanding and is in agreement with plan.   Vitals:   10/18/17 1431 10/18/17 1706  BP: (!) 167/80 (!) 143/95  Pulse: 64 71  Resp: 16 18  Temp: 98.5 F (36.9 C)   SpO2: 97% 99%    Final Clinical Impressions(s) / ED Diagnoses   Final diagnoses:  Nondisplaced fracture of proximal phalanx of right lesser toe(s), initial encounter for closed fracture    ED Discharge Orders    None       Cherly Andersonetrucelli, Brighid Koch R, PA-C 10/18/17 1724    Doug SouJacubowitz, Sam, MD 10/18/17 1726

## 2017-10-25 ENCOUNTER — Ambulatory Visit: Payer: Self-pay | Admitting: Internal Medicine

## 2017-11-06 ENCOUNTER — Telehealth: Payer: Self-pay | Admitting: Pulmonary Disease

## 2017-11-06 NOTE — Telephone Encounter (Signed)
lmtcb x1 for pt. 

## 2017-11-07 MED ORDER — AZITHROMYCIN 250 MG PO TABS: ORAL_TABLET | ORAL | 0 refills | Status: DC

## 2017-11-07 NOTE — Telephone Encounter (Signed)
Pt is calling back [phone]

## 2017-11-07 NOTE — Telephone Encounter (Signed)
lmtcb x2 for pt. 

## 2017-11-07 NOTE — Telephone Encounter (Signed)
Okay to send script for zpak. 

## 2017-11-07 NOTE — Telephone Encounter (Signed)
Spoke with pt, aware of recs.  rx sent to preferred pharmacy.  Nothing further needed.  

## 2017-11-07 NOTE — Telephone Encounter (Signed)
Called and spoke with pt.  Pt is requesting Zpak.  Pt states she developed a chest cold on Friday.  Pt reports of prod cough with light green mucus, increased sob, sweats, chills, chest tightness, sore throat, headache and back soreness, but feels back soreness caused from coughing.  Pt taking ventolin bid with  Pt feels that's she has had a fever but has no measured temp. Preferred pharmacy is [facility].  VS please advise. Thanks.

## 2017-12-03 ENCOUNTER — Ambulatory Visit: Payer: Self-pay | Admitting: Internal Medicine

## 2017-12-03 ENCOUNTER — Other Ambulatory Visit: Payer: Self-pay | Admitting: Internal Medicine

## 2017-12-03 DIAGNOSIS — T50905A Adverse effect of unspecified drugs, medicaments and biological substances, initial encounter: Principal | ICD-10-CM

## 2017-12-03 DIAGNOSIS — R635 Abnormal weight gain: Secondary | ICD-10-CM

## 2017-12-04 ENCOUNTER — Other Ambulatory Visit: Payer: Self-pay | Admitting: Pulmonary Disease

## 2017-12-26 ENCOUNTER — Telehealth: Payer: Self-pay | Admitting: Pulmonary Disease

## 2017-12-26 ENCOUNTER — Other Ambulatory Visit: Payer: Self-pay | Admitting: Pulmonary Disease

## 2017-12-26 NOTE — Telephone Encounter (Signed)
Left message for patient to call back. We do not have samples of Dulera at this time. Per her chart, the folic acid was prescribed by Dr.Tiffany Reed, thus the refill request needs to go to her.   Labs have printed and left in triage for patient to call back.

## 2017-12-27 NOTE — Telephone Encounter (Signed)
lmtcb for pt.  

## 2017-12-28 MED ORDER — FOLIC ACID 1 MG PO TABS: ORAL_TABLET | ORAL | 1 refills | Status: DC

## 2017-12-28 NOTE — Telephone Encounter (Signed)
Pt is calling back [phone]

## 2017-12-28 NOTE — Telephone Encounter (Signed)
Called pt letting her know we had no samples of Dulera and stated to her the last time Folic Acid was prescribed was by Dr. Bufford Spikesiffany Reed.  Pt stated Dr. Craige CottaSood was the one who had originally prescribed that med that it was a mistake that Dr. Renato Gailseed prescribed it the last time.  Sent Rx of folic acid to pt's pharmacy.  Have printed out labwork and placed it up front in the brown accordion folder for pt.  Nothing further needed at this current time.

## 2018-01-18 ENCOUNTER — Other Ambulatory Visit: Payer: Self-pay | Admitting: Internal Medicine

## 2018-01-21 ENCOUNTER — Other Ambulatory Visit: Payer: Self-pay | Admitting: *Deleted

## 2018-01-21 NOTE — Telephone Encounter (Signed)
Per Dr.Reed give only enough until appointment

## 2018-01-24 ENCOUNTER — Encounter: Payer: Self-pay | Admitting: *Deleted

## 2018-01-24 ENCOUNTER — Ambulatory Visit: Payer: Self-pay | Admitting: Internal Medicine

## 2018-01-30 ENCOUNTER — Other Ambulatory Visit: Payer: Self-pay | Admitting: Internal Medicine

## 2018-01-31 LAB — CBC AND DIFFERENTIAL
HEMATOCRIT: 38 (ref 36–46)
HEMOGLOBIN: 12.6 (ref 12.0–16.0)
Neutrophils Absolute: 6
Platelets: 234 (ref 150–399)
WBC: 8.3

## 2018-01-31 LAB — LIPID PANEL
CHOLESTEROL: 154 (ref 0–200)
HDL: 54 (ref 35–70)
LDL Cholesterol: 89
TRIGLYCERIDES: 57 (ref 40–160)

## 2018-01-31 LAB — HEPATIC FUNCTION PANEL
AST: 25 (ref 13–35)
Alkaline Phosphatase: 126 — AB (ref 25–125)
Bilirubin, Total: 0.5

## 2018-01-31 LAB — BASIC METABOLIC PANEL
BUN: 8 (ref 4–21)
CREATININE: 1.1 (ref 0.5–1.1)
Glucose: 84
POTASSIUM: 5.2 (ref 3.4–5.3)
Sodium: 146 (ref 137–147)

## 2018-01-31 LAB — TSH: TSH: 1.52 (ref 0.41–5.90)

## 2018-01-31 LAB — IRON,TIBC AND FERRITIN PANEL: IRON: 46

## 2018-02-03 ENCOUNTER — Other Ambulatory Visit: Payer: Self-pay | Admitting: Internal Medicine

## 2018-02-07 ENCOUNTER — Encounter: Payer: Self-pay | Admitting: Internal Medicine

## 2018-02-07 ENCOUNTER — Ambulatory Visit (INDEPENDENT_AMBULATORY_CARE_PROVIDER_SITE_OTHER): Payer: Self-pay | Admitting: Internal Medicine

## 2018-02-07 VITALS — BP 130/80 | HR 55 | Temp 97.9°F | Ht 67.0 in | Wt 297.2 lb

## 2018-02-07 DIAGNOSIS — E7800 Pure hypercholesterolemia, unspecified: Secondary | ICD-10-CM

## 2018-02-07 DIAGNOSIS — T50905A Adverse effect of unspecified drugs, medicaments and biological substances, initial encounter: Secondary | ICD-10-CM

## 2018-02-07 DIAGNOSIS — F321 Major depressive disorder, single episode, moderate: Secondary | ICD-10-CM

## 2018-02-07 DIAGNOSIS — D860 Sarcoidosis of lung: Secondary | ICD-10-CM

## 2018-02-07 DIAGNOSIS — I10 Essential (primary) hypertension: Secondary | ICD-10-CM

## 2018-02-07 DIAGNOSIS — J4489 Other specified chronic obstructive pulmonary disease: Secondary | ICD-10-CM

## 2018-02-07 DIAGNOSIS — J449 Chronic obstructive pulmonary disease, unspecified: Secondary | ICD-10-CM

## 2018-02-07 DIAGNOSIS — R635 Abnormal weight gain: Secondary | ICD-10-CM

## 2018-02-07 MED ORDER — BUPROPION HCL ER (SR) 150 MG PO TB12: 150.0000 mg | ORAL_TABLET | Freq: Two times a day (BID) | ORAL | 0 refills | Status: DC

## 2018-02-07 MED ORDER — SERTRALINE HCL 25 MG PO TABS: 25.0000 mg | ORAL_TABLET | Freq: Every day | ORAL | 0 refills | Status: DC

## 2018-02-07 MED ORDER — LOSARTAN POTASSIUM-HCTZ 100-12.5 MG PO TABS: 1.0000 | ORAL_TABLET | Freq: Every day | ORAL | 0 refills | Status: DC

## 2018-02-07 MED ORDER — PRAVASTATIN SODIUM 40 MG PO TABS: 40.0000 mg | ORAL_TABLET | Freq: Every day | ORAL | 0 refills | Status: DC

## 2018-02-07 MED ORDER — VITAMIN D3 50 MCG (2000 UT) PO CAPS: 2000.0000 [IU] | ORAL_CAPSULE | Freq: Every day | ORAL | 11 refills | Status: DC

## 2018-02-07 MED ORDER — MONTELUKAST SODIUM 10 MG PO TABS: 10.0000 mg | ORAL_TABLET | Freq: Every day | ORAL | 0 refills | Status: DC

## 2018-02-07 NOTE — Progress Notes (Signed)
Location:  [facility] clinic Provider:  Gawain Crombie L. Renato Gails, D.O., C.M.D.  Code Status: full code Goals of Care:  Advanced Directives 10/18/2017  Does Patient Have a Medical Advance Directive? No  Would patient like information on creating a medical advance directive? No - Patient declined  Pre-existing out of facility DNR order (yellow form or pink MOST form) -   Chief Complaint  Patient presents with  . Medical Management of Chronic Issues    Pt stated weight was up; discuss diets; creantine up .2 pts  . Medication Refill    All medications    HPI: Patient is a 52 y.o. female seen today for medical management of chronic diseases.    Says she's doing pretty good.  Having more breathing issues and cannot afford inhalers--dulera is $300 for one month.    Also having stress incontinence when walking for the past 2 mos.  Has to walk longer distances and she was not thrilled.  Discussed kegels to work on this.  Knows weight gain didn't help.  Using her albuterol right before walking in helps prevent it, also.    Works Scientist, forensic as needed part time.  Discussed need for mammogram, 3D.  Also due for cscope, pap smear--was given info for this before.  Hasn't had one since her 22s or 30s.    [facility].  Says she has not been on the phentermine for 3-4 months.  Her dad has cancer and she's been running him to the doctor and was doing a lot of takeout food.  That has not helped her.  She is starting back with the bariatric clinic.  Discussed renal function and drinking water and her boyfriend is a coke drinker.  Drinks water with crystal lite--can't even get 48 oz in her.  4/4 had labs at the bariatric clinic.  I need their records.  Got a treadmill to use and also plans to walk the dogs.    Says "not this year" for her colonoscopy.   Says she will do the mammogram and pap smear if she can find a gyn with a payment plan option.  Past Medical  History:  Diagnosis Date  . Anxiety   . Asthma    inhalers   . Carpal tunnel syndrome 01/2015   right  . 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 Depression   . Extrinsic asthma, unspecified   . Hidradenitis   . Hyperlipidemia    takes pravachol  . Hypertension   . Keloid scar   . Obesity   . Osteoarthritis   . Osteoarthrosis, unspecified whether generalized or localized, unspecified site   . PONV (postoperative nausea and vomiting)   . 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 Sarcoidosis   . Shortness of breath    exertion  . Wears dentures    top    Past Surgical History:  Procedure Laterality Date  . MULTIPLE TOOTH EXTRACTIONS  2010  . NASAL TURBINATE REDUCTION Bilateral 06/29/2009   inferior  . OPEN REDUCTION NASAL FRACTURE  06/29/2009   with exc. bx. left intranasal papilloma  . OTHER SURGICAL HISTORY  2010   cyst removal from right middle finger    .  REMOVAL OF GROWTH FROM FINGER     DR Metro Kung  . VIDEO BRONCHOSCOPY WITH ENDOBRONCHIAL ULTRASOUND  03/01/2012    Allergies  Allergen Reactions  . Codeine Other (See Comments)    Reaction unknown  . Cyclinex [Tetracycline Hcl] Other (See Comments)    Reaction unknown, all "cyclines"  . Milk Thistle Hives  . Morphine And Related Other (See Comments)    Reaction unknown    Outpatient Encounter Medications as of 02/07/2018  Medication Sig  . acetaminophen (TYLENOL) 500 MG tablet Take 500 mg by mouth every 6 (six) hours as needed. For pain  . buPROPion (WELLBUTRIN SR) 150 MG 12 hr tablet TAKE 1 TABLET BY MOUTH TWICE DAILY  . calcium carbonate (TUMS - DOSED IN MG ELEMENTAL CALCIUM) 500 MG chewable tablet Chew 1 tablet by mouth daily as  needed. For indigestion  . cephALEXin (KEFLEX) 500 MG capsule Take 500mg  BID QOD-(Tues, thurs, sat, and sun)  . Cholecalciferol (VITAMIN D-3) 5000 units TABS Take 5 capsules by mouth once a week.   . clindamycin (CLEOCIN T) 1 % lotion Apply 1 application topically as needed.   . diclofenac sodium (VOLTAREN) 1 % GEL Apply 4 g topically 4 (four) times daily. To left hip, right knee  . Eflornithine HCl (VANIQA) 13.9 % cream Apply topically 2 (two) times daily as needed.   . folic acid (FOLVITE) 1 MG tablet TAKE 1 TABLET BY MOUTH EVERY DAY EXCEPT ON DAYS WHEN TAKING METHOTREXATE.  Marland Kitchen GLUCOSAMINE HCL PO Take 1 tablet by mouth 2 (two) times daily.  Marland Kitchen ipratropium-albuterol (DUONEB) 0.5-2.5 (3) MG/3ML SOLN Take 3 mLs by nebulization 2 (two) times daily as needed. For shortness of breath  . losartan-hydrochlorothiazide (HYZAAR) 100-12.5 MG tablet TAKE 1 TABLET BY MOUTH DAILY  . methotrexate (RHEUMATREX) 2.5 MG tablet TAKE 4 TABLETS BY MOUTH 1 TIME A WEEK  . methotrexate (RHEUMATREX) 2.5 MG tablet TAKE 4 TABLETS BY MOUTH 1 TIME A WEEK  . mometasone-formoterol (DULERA) 200-5 MCG/ACT AERO Inhale 2 puffs into the lungs 2 (two) times daily.  . montelukast (SINGULAIR) 10 MG tablet TAKE ONE TABLET BY MOUTH EVERY DAY AS NEEDED FOR ALLERGIES  . phentermine (ADIPEX-P) 37.5 MG tablet TAKE 1/2 TABLET BY MOUTH TWICE DAILY  . pravastatin (PRAVACHOL) 40 MG tablet TAKE 1 TABLET BY MOUTH EVERY DAY  . pyridOXINE (VITAMIN B-6) 50 MG tablet Take 50 mg by mouth daily.  . sertraline (ZOLOFT) 25 MG tablet Take 1 tablet (25 mg total) by mouth daily.  Marland Kitchen sulfamethoxazole-trimethoprim (BACTRIM,SEPTRA) 400-80 MG tablet TAKE 1 TABLET BY MOUTH THREE TIMES A WEEK  . traMADol (ULTRAM) 50 MG tablet Take 1 tablet (50 mg total) by mouth every 6 (six) hours as needed for severe pain.  . TURMERIC PO Take 538 mg by mouth daily.  . vitamin B-12 (CYANOCOBALAMIN) 100 MCG tablet Take 100 mcg by mouth daily.  Marland Kitchen zinc gluconate 50 MG tablet Take 50  mg by mouth daily.  . [DISCONTINUED] albuterol (PROVENTIL HFA;VENTOLIN HFA) 108 (90 BASE) MCG/ACT inhaler Inhale 2 puffs into the lungs every 4 (four) hours as needed. For shortness of breath  . [DISCONTINUED] azithromycin (ZITHROMAX) 250 MG tablet Take 2 today, then 1 daily until gone.   No facility-administered encounter medications on file as of 02/07/2018.     Review of Systems:  Review of Systems  Constitutional: Negative for chills, fever and malaise/fatigue.  HENT: Negative for hearing loss.   Eyes: Negative for blurred vision.  Respiratory: Positive for cough, shortness of breath  and wheezing.        Can't afford inhalers--says she's going to contact company  Cardiovascular: Negative for chest pain, palpitations and leg swelling.  Gastrointestinal: Negative for abdominal pain, blood in stool, constipation, diarrhea and melena.  Genitourinary: Negative for dysuria.       Some stress incontinence  Musculoskeletal: Negative for falls and joint pain.  Skin: Negative for itching and rash.  Neurological: Negative for dizziness and loss of consciousness.  Endo/Heme/Allergies: Does not bruise/bleed easily.  Psychiatric/Behavioral: Negative for depression and memory loss.    Health Maintenance  Topic Date Due  . HIV Screening  03/19/1980  . PAP SMEAR  03/19/1986  . COLONOSCOPY  03/20/2015  . MAMMOGRAM  01/28/2017  . INFLUENZA VACCINE  05/30/2018  . TETANUS/TDAP  12/29/2026    Physical Exam: Vitals:   02/07/18 1039  BP: 130/80  Pulse: (!) 55  Temp: 97.9 F (36.6 C)  TempSrc: Oral  SpO2: 98%  Weight: 297 lb 3.2 oz (134.8 kg)  Height: 5\' 7"  (1.702 m)   Body mass index is 46.55 kg/m. Physical Exam  Constitutional: She is oriented to person, place, and time. She appears well-developed and well-nourished. No distress.  HENT:  Head: Normocephalic and atraumatic.  Cardiovascular: Normal rate, regular rhythm, normal heart sounds and intact distal pulses.  Pulmonary/Chest:  Effort normal. She has wheezes.  Abdominal: Bowel sounds are normal.  Musculoskeletal: Normal range of motion.  Neurological: She is alert and oriented to person, place, and time.  Skin: Skin is warm and dry.  Psychiatric: She has a normal mood and affect.    Labs reviewed: Basic Metabolic Panel: Recent Labs    05/17/17 1223 09/24/17 1626  NA 140 138  K 3.9 4.0  CL 105 99  CO2 32 31  GLUCOSE 99 105*  BUN 9 10  CREATININE 1.15 1.11  CALCIUM 9.3 9.8   Liver Function Tests: Recent Labs    05/17/17 1223 09/24/17 1626  AST 17 21  ALT 13 16  ALKPHOS 105 124*  BILITOT 0.3 0.5  PROT 7.5 7.7  ALBUMIN 3.5 3.7   No results for input(s): LIPASE, AMYLASE in the last 8760 hours. No results for input(s): AMMONIA in the last 8760 hours. CBC: Recent Labs    05/17/17 1223 09/24/17 1626  WBC 7.8 8.4  HGB 13.5 13.5  HCT 41.6 40.9  MCV 95.4 94.1  PLT 201.0 236.0   Lipid Panel: No results for input(s): CHOL, HDL, LDLCALC, TRIG, CHOLHDL, LDLDIRECT in the last 8760 hours. Lab Results  Component Value Date   HGBA1C 5.4 12/28/2016   Assessment/Plan 1. Weight gain  -pt not eating right, drink cokes, takes steroid meds -is now going back to the bariatric clinic  -ok with me for her to take phentermine for her weight loss if they recommend it over there--got records and creatinine stable over the past 5 years--would recommend monitoring of this every 4 months -encouraged more physical activity and improved diet  2. Pulmonary sarcoidosis - should f/u with pulmonary, but says she can't afford inhaler especially her dulera--she will call drug company to see if she can apply for any special programs since her job does not offer drug insurance - montelukast (SINGULAIR) 10 MG tablet; Take 1 tablet (10 mg total) by mouth at bedtime.  Dispense: 90 tablet; Refill: 0 -advised against vaping just like pulmonary did  3. Depression, major, single episode, moderate (HCC) -continue current  antidepressants - sertraline (ZOLOFT) 25 MG tablet; Take 1 tablet (25 mg  total) by mouth daily.  Dispense: 90 tablet; Refill: 0 - buPROPion (WELLBUTRIN SR) 150 MG 12 hr tablet; Take 1 tablet (150 mg total) by mouth 2 (two) times daily.  Dispense: 180 tablet; Refill: 0  4. Benign essential HTN - bp controlled with current meds, cont same and work on diet and exercise so hopefully she won't need bp meds - losartan-hydrochlorothiazide (HYZAAR) 100-12.5 MG tablet; Take 1 tablet by mouth daily.  Dispense: 90 tablet; Refill: 0  5. Pure hypercholesterolemia -work on diet and exercise and attend bariatric clinic - pravastatin (PRAVACHOL) 40 MG tablet; Take 1 tablet (40 mg total) by mouth daily.  Dispense: 90 tablet; Refill: 0  6. Morbid obesity due to excess calories (HCC) -needs to lose weight which has trended up again due to poor eating habits and lack of exercise  7. COPD with chronic bronchitis (HCC) -worse lately but b/c dulera too expensive--must return to pulmonary if does not qualify for a discount thru the drug company - montelukast (SINGULAIR) 10 MG tablet; Take 1 tablet (10 mg total) by mouth at bedtime.  Dispense: 90 tablet; Refill: 0  Labs/tests ordered: no new today, but will check labs at 4 mo f/u Next appt:  02/07/2018  Avayah Raffety L. Darrol Brandenburg, D.O. Geriatrics [facility] [address], [place] [address] Cell Phone (Mon-Fri 8am-5pm):  [phone] On Call:  [phone] & follow prompts after 5pm & weekends Office Phone:  [phone] Office Fax:  [phone]

## 2018-04-16 ENCOUNTER — Telehealth: Payer: Self-pay | Admitting: *Deleted

## 2018-04-16 NOTE — Telephone Encounter (Signed)
Patient called and stated that she is retaining fluid in her feet and legs. Patient wants to know if the Losartan/HCTZ can be increased or if a lasix can be added. No other complaint. Please Advise.

## 2018-04-17 NOTE — Telephone Encounter (Signed)
She should be seen because this is new.

## 2018-04-17 NOTE — Telephone Encounter (Signed)
Appointment scheduled.

## 2018-04-18 ENCOUNTER — Ambulatory Visit: Payer: Self-pay | Admitting: Internal Medicine

## 2018-05-01 ENCOUNTER — Other Ambulatory Visit: Payer: Self-pay | Admitting: Pulmonary Disease

## 2018-05-06 ENCOUNTER — Other Ambulatory Visit: Payer: Self-pay | Admitting: Pulmonary Disease

## 2018-05-24 ENCOUNTER — Other Ambulatory Visit: Payer: Self-pay | Admitting: Pulmonary Disease

## 2018-05-30 ENCOUNTER — Encounter: Payer: Self-pay | Admitting: Internal Medicine

## 2018-05-30 ENCOUNTER — Ambulatory Visit (INDEPENDENT_AMBULATORY_CARE_PROVIDER_SITE_OTHER): Payer: Self-pay | Admitting: Internal Medicine

## 2018-05-30 VITALS — BP 118/60 | HR 62 | Temp 97.7°F | Ht 67.0 in | Wt 304.0 lb

## 2018-05-30 DIAGNOSIS — I2510 Atherosclerotic heart disease of native coronary artery without angina pectoris: Secondary | ICD-10-CM

## 2018-05-30 DIAGNOSIS — D860 Sarcoidosis of lung: Secondary | ICD-10-CM

## 2018-05-30 DIAGNOSIS — R600 Localized edema: Secondary | ICD-10-CM

## 2018-05-30 MED ORDER — FUROSEMIDE 40 MG PO TABS: 40.0000 mg | ORAL_TABLET | Freq: Every day | ORAL | 0 refills | Status: DC

## 2018-05-30 NOTE — Progress Notes (Signed)
Location:  [facility] clinic Provider:  Cele Mote L. Renato Gails, D.O., C.M.D.  Code Status: full code Goals of Care:  Advanced Directives 10/18/2017  Does Patient Have a Medical Advance Directive? No  Would patient like information on creating a medical advance directive? No - Patient declined  Pre-existing out of facility DNR order (yellow form or pink MOST form) -   Chief Complaint  Patient presents with  . Acute Visit    both feet swelling few months    HPI: Patient is a 52 y.o. female with h/o morbid obesity, sarcoidosis, hyperlipidemia, chronic bronchitis, recurrent depression, osteoarthritis, smoking and hypertension seen today for an acute visit for her feet swelling for a few months.  She had some kidney function issues at first at the bariatric clinic.  That resolved, but it got better.  She keeps getting more and more fluid on the scales there.  She does not add extra salt, but does still eat foods with salt--has takeout a lot b/c of taking care of her parents.   CT of her chest showed aortic and left anterior descending plaque.  Is eating the berry salad at [employer].  She is avoiding cheese.  She is short of breath--doing pursed lip breathing rather than using inhalers due to cost and no insurance.  Stepmom also cooks a lot of fatty foods.  She's thinking about one of the mail order prepared food services.  Pt is very dyspneic going up stairs in the home of her patient (caregiver).  She is vaping not smoking cigarettes.  Is trying to stop it.  Her brother smokes in the house.  She has not had a CT chest with Dr. Craige Cotta since 2017.  She is not using her inhalers.    Past Medical History:  Diagnosis Date  . Anxiety   . Asthma    inhalers   . Carpal tunnel syndrome 01/2015   right  . 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 Depression   . Extrinsic asthma, unspecified   . Hidradenitis   . Hyperlipidemia      takes pravachol  . Hypertension   . Keloid scar   . Obesity   . Osteoarthritis   . Osteoarthrosis, unspecified whether generalized or localized, unspecified site   . PONV (postoperative nausea and vomiting)   . 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 Sarcoidosis   . Shortness of breath    exertion  . Wears dentures    top    Past Surgical History:  Procedure Laterality Date  . MULTIPLE TOOTH EXTRACTIONS  2010  . NASAL TURBINATE REDUCTION Bilateral 06/29/2009   inferior  . OPEN REDUCTION NASAL FRACTURE  06/29/2009   with exc. bx. left intranasal papilloma  . OTHER SURGICAL HISTORY  2010   cyst removal from right middle finger    . REMOVAL OF GROWTH FROM FINGER     DR Metro Kung  . VIDEO BRONCHOSCOPY WITH ENDOBRONCHIAL ULTRASOUND  03/01/2012    Allergies  Allergen Reactions  . Codeine Other (See Comments)    Reaction unknown  . Cyclinex [Tetracycline Hcl] Other (See Comments)    Reaction unknown, all "cyclines"  . Milk Thistle Hives  . Morphine And Related Other (See Comments)    Reaction unknown  Outpatient Encounter Medications as of 05/30/2018  Medication Sig  . acetaminophen (TYLENOL) 500 MG tablet Take 500 mg by mouth every 6 (six) hours as needed. For pain  . buPROPion (WELLBUTRIN SR) 150 MG 12 hr tablet Take 1 tablet (150 mg total) by mouth 2 (two) times daily.  . calcium carbonate (TUMS - DOSED IN MG ELEMENTAL CALCIUM) 500 MG chewable tablet Chew 1 tablet by mouth daily as needed. For indigestion  . Cholecalciferol (VITAMIN D3) 2000 units capsule Take 1 capsule (2,000 Units total) by mouth daily.  . folic acid (FOLVITE) 1 MG tablet TAKE 1 TABLET BY MOUTH EVERY DAY EXCEPT ON DAYS WHEN TAKING METHOTREXATE  . GLUCOSAMINE HCL PO Take 1 tablet by mouth 2 (two) times  daily.  Marland Kitchen losartan-hydrochlorothiazide (HYZAAR) 100-12.5 MG tablet Take 1 tablet by mouth daily.  . methotrexate (RHEUMATREX) 2.5 MG tablet TAKE 4 TABLETS BY MOUTH 1 TIME A WEEK  . methotrexate (RHEUMATREX) 2.5 MG tablet TAKE 4 TABLETS BY MOUTH 1 TIME A WEEK  . mometasone-formoterol (DULERA) 200-5 MCG/ACT AERO Inhale 2 puffs into the lungs 2 (two) times daily.  . montelukast (SINGULAIR) 10 MG tablet Take 1 tablet (10 mg total) by mouth at bedtime.  . phentermine 37.5 MG capsule Take 37.5 mg by mouth every morning. PATIENT GETTING THIS FILLED BY BARIATRIC CLINIC  . pravastatin (PRAVACHOL) 40 MG tablet Take 1 tablet (40 mg total) by mouth daily.  . sertraline (ZOLOFT) 25 MG tablet Take 1 tablet (25 mg total) by mouth daily.  Marland Kitchen sulfamethoxazole-trimethoprim (BACTRIM,SEPTRA) 400-80 MG tablet TAKE 1 TABLET BY MOUTH THREE TIMES A WEEK  . TURMERIC PO Take 538 mg by mouth daily.  . vitamin B-12 (CYANOCOBALAMIN) 100 MCG tablet Take 100 mcg by mouth daily.  Marland Kitchen zinc gluconate 50 MG tablet Take 50 mg by mouth daily.   No facility-administered encounter medications on file as of 05/30/2018.     Review of Systems:  Review of Systems  Constitutional: Negative for chills, fever and weight loss.       Weight gain  HENT: Negative for congestion.   Eyes: Negative for blurred vision.  Respiratory: Positive for cough, shortness of breath and wheezing. Negative for sputum production.   Cardiovascular: Positive for leg swelling. Negative for chest pain, palpitations, orthopnea, claudication and PND.  Gastrointestinal: Negative for abdominal pain.  Genitourinary: Negative for dysuria.  Musculoskeletal: Positive for joint pain. Negative for falls.  Neurological: Negative for dizziness.  Psychiatric/Behavioral: Negative for depression.    Health Maintenance  Topic Date Due  . HIV Screening  03/19/1980  . PAP SMEAR  03/19/1986  . COLONOSCOPY  03/20/2015  . MAMMOGRAM  01/28/2017  . INFLUENZA VACCINE   05/30/2018  . TETANUS/TDAP  12/29/2026    Physical Exam: Vitals:   05/30/18 0853  Weight: (!) 304 lb (137.9 kg)  Height: 5\' 7"  (1.702 m)   Body mass index is 47.61 kg/m. Physical Exam  Constitutional: She is oriented to person, place, and time. She appears well-developed and well-nourished. No distress.  Obese female  Cardiovascular: Normal rate, regular rhythm, normal heart sounds and intact distal pulses.  Nonpitting edema of bilateral feet and ankles  Pulmonary/Chest: Effort normal. She has wheezes.  End-expiratory wheezes  Abdominal: Bowel sounds are normal.  Musculoskeletal: Normal range of motion.  Neurological: She is alert and oriented to person, place, and time.  Skin: Skin is warm and dry.    Labs reviewed: Basic Metabolic Panel: Recent Labs    09/24/17 1626 01/31/18  NA  138 146  K 4.0 5.2  CL 99  --   CO2 31  --   GLUCOSE 105*  --   BUN 10 8  CREATININE 1.11 1.1  CALCIUM 9.8  --   TSH  --  1.52   Liver Function Tests: Recent Labs    09/24/17 1626 01/31/18  AST 21 25  ALT 16  --   ALKPHOS 124* 126*  BILITOT 0.5  --   PROT 7.7  --   ALBUMIN 3.7  --    No results for input(s): LIPASE, AMYLASE in the last 8760 hours. No results for input(s): AMMONIA in the last 8760 hours. CBC: Recent Labs    09/24/17 1626 01/31/18  WBC 8.4 8.3  NEUTROABS  --  6  HGB 13.5 12.6  HCT 40.9 38  MCV 94.1  --   PLT 236.0 234   Lipid Panel: Recent Labs    01/31/18  CHOL 154  HDL 54  LDLCALC 89  TRIG 57   Lab Results  Component Value Date   HGBA1C 5.4 12/28/2016    Assessment/Plan 1. Localized edema - suspect primarily due to her sodium intake from fast foods and other family members' cooking -encouraged healthier eating habits--low sodium diet - Brain Natriuretic Peptide; Future to eval for CHF, but no rales on exam, no orthopnea, PND--sob likely pulmonary from not using inhalers and vaping - Basic metabolic panel; Future - Ambulatory referral to  Cardiology - furosemide (LASIX) 40 MG tablet; Take 1 tablet (40 mg total) by mouth daily. X 3 days  Dispense: 6 tablet; Refill: 0  2. Coronary artery disease involving native coronary artery of native heart without angina pectoris -noted on CT chest with pulmonary in 2017, high risk for MI  -needs cardiac eval - Brain Natriuretic Peptide; Future - Basic metabolic panel; Future - Ambulatory referral to Cardiology  3. Pulmonary sarcoidosis -f/u with Dr. Craige CottaSood, cont methotrexate therapy, should be using inhalers but cannot afford, continues vaping and "trying to quit"  4. Morbid obesity due to excess calories (HCC) -weight trending up likely due to high sodium fast foods  -counseled on dietary changes   Labs/tests ordered:   Orders Placed This Encounter  Procedures  . Brain Natriuretic Peptide    Standing Status:   Future    Standing Expiration Date:   06/30/2018  . Basic metabolic panel    Standing Status:   Future    Standing Expiration Date:   06/30/2018    Order Specific Question:   Has the patient fasted?    Answer:   Yes  . Ambulatory referral to Cardiology    Referral Priority:   Routine    Referral Type:   Consultation    Referral Reason:   Specialty Services Required    Requested Specialty:   Cardiology    Number of Visits Requested:   1    Next appt:  Visit date not found   Sevrin Sally L. Rashel Okeefe, D.O. Geriatrics [facility] [address] [place], [place] [address] Cell Phone (Mon-Fri 8am-5pm):  [phone] On Call:  [phone] & follow prompts after 5pm & weekends Office Phone:  [phone] Office Fax:  [phone]

## 2018-06-03 ENCOUNTER — Other Ambulatory Visit: Payer: Self-pay

## 2018-06-03 DIAGNOSIS — R600 Localized edema: Secondary | ICD-10-CM

## 2018-06-03 DIAGNOSIS — I2510 Atherosclerotic heart disease of native coronary artery without angina pectoris: Secondary | ICD-10-CM

## 2018-06-04 LAB — BASIC METABOLIC PANEL
BUN/Creatinine Ratio: 12 (calc) (ref 6–22)
BUN: 13 mg/dL (ref 7–25)
CO2: 29 mmol/L (ref 20–32)
Calcium: 9.1 mg/dL (ref 8.6–10.4)
Chloride: 102 mmol/L (ref 98–110)
Creat: 1.12 mg/dL — ABNORMAL HIGH (ref 0.50–1.05)
Glucose, Bld: 94 mg/dL (ref 65–139)
Potassium: 3.9 mmol/L (ref 3.5–5.3)
Sodium: 138 mmol/L (ref 135–146)

## 2018-06-04 LAB — BRAIN NATRIURETIC PEPTIDE: Brain Natriuretic Peptide: 16 pg/mL (ref <100)

## 2018-06-13 ENCOUNTER — Ambulatory Visit: Payer: Self-pay | Admitting: Internal Medicine

## 2018-07-12 ENCOUNTER — Telehealth: Payer: Self-pay

## 2018-07-12 NOTE — Telephone Encounter (Signed)
Great, I'm glad to hear this.

## 2018-07-12 NOTE — Telephone Encounter (Signed)
FYI:   Patient called to let Dr. Renato Gailseed know that she was able to get an earlier appointment with cardiology than next year. Patient will see Dietrich Patesaula Ross on 09/02/18.

## 2018-07-25 ENCOUNTER — Encounter: Payer: Self-pay | Admitting: Internal Medicine

## 2018-07-25 ENCOUNTER — Ambulatory Visit (INDEPENDENT_AMBULATORY_CARE_PROVIDER_SITE_OTHER): Payer: Self-pay | Admitting: Internal Medicine

## 2018-07-25 VITALS — BP 136/88 | HR 73 | Ht 67.0 in | Wt 303.6 lb

## 2018-07-25 DIAGNOSIS — I2510 Atherosclerotic heart disease of native coronary artery without angina pectoris: Secondary | ICD-10-CM

## 2018-07-25 DIAGNOSIS — E785 Hyperlipidemia, unspecified: Secondary | ICD-10-CM

## 2018-07-25 DIAGNOSIS — R609 Edema, unspecified: Secondary | ICD-10-CM

## 2018-07-25 DIAGNOSIS — I10 Essential (primary) hypertension: Secondary | ICD-10-CM

## 2018-07-25 MED ORDER — ASPIRIN EC 81 MG PO TBEC: 81.0000 mg | DELAYED_RELEASE_TABLET | Freq: Every day | ORAL | 3 refills | Status: DC

## 2018-07-25 MED ORDER — ATORVASTATIN CALCIUM 40 MG PO TABS: 40.0000 mg | ORAL_TABLET | Freq: Every day | ORAL | 3 refills | Status: DC

## 2018-07-25 NOTE — Patient Instructions (Addendum)
Your physician has recommended you make the following change in your medication:  1.) start aspirin 81 mg once a day (use enteric coated --EC) 2.) after completing current bottle of pravastatin, please start atorvastatin 40 mg once daily   Your physician wants you to follow-up in: Jan/Feb 2020 with Dr. Tenny Craw.  You will receive a reminder letter in the mail two months in advance. If you don't receive a letter, please call our office to schedule the follow-up appointment. We will plan to check lipids at next visit.

## 2018-07-25 NOTE — Progress Notes (Signed)
Cardiology Office Note   Date:  07/25/2018   ID:  Kaitlyn [name], DOB 04-06-1966, MRN [id number]  PCP:  Kaitlyn Balo, DO  Cardiologist:   Kaitlyn Pates, MD   Pt referred by Kaitlyn [name] for edema, abnorma chest CT    History of Present Illness: Kaitlyn [name] is a 52 y.o. female with a history of morbid obesity, sarcoidosis, HL, brhonchitis, dperession, OA, tobacco use and HTN   Followed by Kaitlyn [name].  Seen in August   Complaints of swelling in legs and SOB   Pursed lip breathing   Does not use inhalers due to cost and no insurance.  Eats salt and fatty food She denies CP    CT of chest with plaquing of coronary arteries       Pt does admit to vaping  She was started on lasix 40 in august      LPipds in APril 2019 LDL 89  HDL 54    Current Meds  Medication Sig  . acetaminophen (TYLENOL) 500 MG tablet Take 500 mg by mouth every 6 (six) hours as needed. For pain  . ALBUTEROL IN Inhale 2 puffs into the lungs every 4 (four) hours as needed (shortness of breath).  Marland Kitchen buPROPion (WELLBUTRIN SR) 150 MG 12 hr tablet Take 1 tablet (150 mg total) by mouth 2 (two) times daily.  . calcium carbonate (TUMS - DOSED IN MG ELEMENTAL CALCIUM) 500 MG chewable tablet Chew 1 tablet by mouth daily as needed. For indigestion  . cephALEXin (KEFLEX) 500 MG capsule Take one (1) capsule (500 mg) by mouth twice daily four (4) times per week.  . Cholecalciferol (VITAMIN D PO) Take 1 tablet by mouth once a week.  . clindamycin (CLEOCIN T) 1 % lotion Apply 1 application topically 2 (two) times daily as needed (affected areas).  . folic acid (FOLVITE) 1 MG tablet TAKE 1 TABLET BY MOUTH EVERY DAY EXCEPT ON DAYS WHEN TAKING METHOTREXATE  . GLUCOSAMINE HCL PO Take 1 tablet by mouth 2 (two) times daily.  Marland Kitchen ipratropium-albuterol (DUONEB) 0.5-2.5 (3) MG/3ML SOLN Take 3 mLs by nebulization every 4 (four) hours as needed (shortness of breath).  . losartan-hydrochlorothiazide (HYZAAR) 100-12.5 MG tablet Take 1 tablet by mouth  daily.  . methotrexate (RHEUMATREX) 2.5 MG tablet TAKE 4 TABLETS BY MOUTH 1 TIME A WEEK  . mometasone-formoterol (DULERA) 200-5 MCG/ACT AERO Inhale 2 puffs into the lungs 2 (two) times daily.  . montelukast (SINGULAIR) 10 MG tablet Take 1 tablet (10 mg total) by mouth at bedtime.  . pravastatin (PRAVACHOL) 40 MG tablet Take 1 tablet (40 mg total) by mouth daily.  . sertraline (ZOLOFT) 25 MG tablet Take 1 tablet (25 mg total) by mouth daily.  Marland Kitchen sulfamethoxazole-trimethoprim (BACTRIM,SEPTRA) 400-80 MG tablet TAKE 1 TABLET BY MOUTH THREE TIMES A WEEK  . TURMERIC PO Take 538 mg by mouth daily.  . vitamin B-12 (CYANOCOBALAMIN) 100 MCG tablet Take 100 mcg by mouth daily.  Marland Kitchen zinc gluconate 50 MG tablet Take 50 mg by mouth daily.     Allergies:   Codeine; Cyclinex [tetracycline hcl]; Milk thistle; and Morphine and related   Past Medical History:  Diagnosis Date  . Anxiety   . Asthma    inhalers   . Carpal tunnel syndrome 01/2015   right  . 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 Depression   . Extrinsic  asthma, unspecified   . Hidradenitis   . Hyperlipidemia    takes pravachol  . Hypertension   . Keloid scar   . Obesity   . Osteoarthritis   . Osteoarthrosis, unspecified whether generalized or localized, unspecified site   . PONV (postoperative nausea and vomiting)   . 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 Sarcoidosis   . Shortness of breath    exertion  . Wears dentures    top    Past Surgical History:  Procedure Laterality Date  . MULTIPLE TOOTH EXTRACTIONS  2010  . NASAL TURBINATE REDUCTION Bilateral 06/29/2009   inferior  . OPEN REDUCTION NASAL FRACTURE  06/29/2009   with exc. bx. left  intranasal papilloma  . OTHER SURGICAL HISTORY  2010   cyst removal from right middle finger    . REMOVAL OF GROWTH FROM FINGER     DR Metro Kung  . VIDEO BRONCHOSCOPY WITH ENDOBRONCHIAL ULTRASOUND  03/01/2012     Social History:  The patient  reports that she has been smoking cigarettes and e-cigarettes. She has a 35.00 pack-year smoking history. She has never used smokeless tobacco. She reports that she drinks alcohol. She reports that she does not use drugs.   Family History:  The patient's family history includes Arthritis in her mother; Cancer in her father and maternal aunt; Depression in her mother; Diabetes in her maternal aunt; Heart disease in her father; Hyperlipidemia in her mother; Hypertension in her father; Stroke in her mother.    ROS:  Please see the history of present illness. All other systems are reviewed and  Negative to the above problem except as noted.    PHYSICAL EXAM: VS:  BP 136/88   Pulse 73   Ht 5\' 7"  (1.702 m)   Wt (!) 303 lb 9.6 oz (137.7 kg)   BMI 47.55 kg/m   GEN: Morbidly obese 52 yo in no acute distress  HEENT: normal  Neck: no JVD, carotid bruits, or masses Cardiac: RRR; no murmurs, rubs, or gallops  Tr edema  Respiratory: Wheezes GI: soft, nontender, nondistended, + BS  No hepatomegaly  MS: no deformity Moving all extremities   Skin: warm and dry, no rash Neuro:  Strength and sensation are intact Psych: euthymic mood, full affect   EKG:  EKG is ordered today. SR 73bpm   PACs   LVH  Anteorseptal MI     Lipid Panel    Component Value Date/Time   CHOL 154 01/31/2018   CHOL 168 02/25/2016 0929   TRIG 57 01/31/2018   HDL 54 01/31/2018   HDL 54 02/25/2016 0929   CHOLHDL 3.0 12/28/2016 1013   VLDL 19 12/28/2016 1013   LDLCALC 89 01/31/2018   LDLCALC 99 02/25/2016 0929      Wt Readings from Last 3 Encounters:  07/25/18 (!) 303 lb 9.6 oz (137.7 kg)  05/30/18 (!) 304 lb (137.9 kg)  02/07/18 297 lb 3.2 oz (134.8 kg)      ASSESSMENT  AND PLAN:  1  CAD   Pt with atherosclerosis on CT scan in 2017 (aorta and coronary arteries)She has signif reason for SOB with lung problems   I am not convinced of active angina I would recomm ASA 81 mg daily   She should also be on a statin  2   Edema   Pt  admits to eating salty foods   Discussed Na restriction, fluid restriction   She does have tr edema    Can use lasix as needed  3   Pulm   Pt with signif pulm probl  COntinues to vape   Discussed quitting    4   Dyslipidemia  Pt on pravastatin   Needs tighter control with atherosclerosis   I would switch to lipitor 40    Will need f/u lpids  5   Substance abuse   Discussed the importance of quitting vaping     6   Morbid obesity   Disucssed wt loss, how it would improve symptoms.    Would like to see her in Jan / Feb 2020   Current medicines are reviewed at length with the patient today.  The patient does not have concerns regarding medicines.  Signed, Kaitlyn Pates, MD  07/25/2018 8:47 AM    [facility] [address] [place], [place], [place]  [address] Phone: [phone]; Fax: [phone]

## 2018-08-05 ENCOUNTER — Encounter: Payer: Self-pay | Admitting: Pulmonary Disease

## 2018-08-05 ENCOUNTER — Ambulatory Visit (INDEPENDENT_AMBULATORY_CARE_PROVIDER_SITE_OTHER): Payer: Self-pay | Admitting: Pulmonary Disease

## 2018-08-05 VITALS — BP 124/84 | HR 72 | Ht 67.0 in | Wt 298.0 lb

## 2018-08-05 DIAGNOSIS — Z23 Encounter for immunization: Secondary | ICD-10-CM

## 2018-08-05 DIAGNOSIS — J449 Chronic obstructive pulmonary disease, unspecified: Secondary | ICD-10-CM

## 2018-08-05 DIAGNOSIS — Z720 Tobacco use: Secondary | ICD-10-CM

## 2018-08-05 DIAGNOSIS — D860 Sarcoidosis of lung: Secondary | ICD-10-CM

## 2018-08-05 DIAGNOSIS — Z6841 Body Mass Index (BMI) 40.0 and over, adult: Secondary | ICD-10-CM

## 2018-08-05 MED ORDER — MONTELUKAST SODIUM 10 MG PO TABS: 10.0000 mg | ORAL_TABLET | Freq: Every day | ORAL | 0 refills | Status: DC

## 2018-08-05 MED ORDER — MOMETASONE FURO-FORMOTEROL FUM 200-5 MCG/ACT IN AERO: 2.0000 | INHALATION_SPRAY | Freq: Two times a day (BID) | RESPIRATORY_TRACT | 5 refills | Status: DC

## 2018-08-05 MED ORDER — METHOTREXATE 2.5 MG PO TABS: ORAL_TABLET | ORAL | 0 refills | Status: DC

## 2018-08-05 MED ORDER — SULFAMETHOXAZOLE-TRIMETHOPRIM 400-80 MG PO TABS: ORAL_TABLET | ORAL | 0 refills | Status: DC

## 2018-08-05 MED ORDER — IPRATROPIUM-ALBUTEROL 0.5-2.5 (3) MG/3ML IN SOLN: 3.0000 mL | RESPIRATORY_TRACT | 3 refills | Status: DC | PRN

## 2018-08-05 NOTE — Progress Notes (Signed)
[facility]  Chief Complaint  Patient presents with  . Follow-up    Pt has increase SOB with exertion, wheezing and trouble breathing in last 2 months. Pt would like flu shot today.    Constitutional: BP 124/84 (BP Location: Left Arm, Cuff Size: Normal)   Pulse 72   Ht _0  (1.702 m)   Wt 298 lb (135.2 kg)   SpO2 98%   BMI 46.67 kg/m   History of Present Illness: Kaitlyn [name] is a 52 y.o. female smoker with sarcoidosis, and COPD.  She ran out of [place].  Breathing got worse.  She has more cough, wheeze, and chest congestion.  Not having sinus congestion, sore throat, gland swelling, skin rash, hemoptysis, fever.  Leg swelling is better.  Remains on MTX, folic acid, bactrim.  She still smokes cigarettes.  Plans to quit this weekend.  Comprehensive Respiratory Exam:  Appearance - well kempt  ENMT - nasal mucosa moist, turbinates clear, midline nasal septum, wears dentures, no gingival bleeding, no oral exudates, no tonsillar hypertrophy Neck - no masses, trachea midline, no thyromegaly, no elevation in JVP Respiratory - normal appearance of chest wall, normal respiratory effort w/o accessory muscle use, no dullness on percussion, no wheezing or rales CV - s1s2 regular rate and rhythm, no murmurs, no peripheral edema, no varicosities, radial pulses symmetric GI - soft, non tender, no masses, no hepatosplenomegaly Lymph - no adenopathy noted in neck and axillary areas MSK - normal muscle strength and tone, normal gait Ext - no cyanosis, clubbing, or joint inflammation noted Skin - no rashes, lesions, or ulcers Neuro - oriented to person, place, and time Psych - normal mood and affect   BMP Latest Ref Rng & Units 06/03/2018 01/31/2018 09/24/2017  Glucose 65 - 139 mg/dL 94 - 105(H)  BUN 7 - 25 mg/dL _1 Creatinine 0.50 - 1.05 mg/dL 1.12(H) 1.1 1.11  BUN/Creat Ratio 6 - 22 (calc) 12 - -  Sodium 135 - 146 mmol/L 138 146 138  Potassium  3.5 - 5.3 mmol/L 3.9 5.2 4.0  Chloride 98 - 110 mmol/L 102 - 99  CO2 20 - 32 mmol/L 29 - 31  Calcium 8.6 - 10.4 mg/dL 9.1 - 9.8    CBC Latest Ref Rng & Units 01/31/2018 09/24/2017 05/17/2017  WBC - 8.3 8.4 7.8  Hemoglobin 12.0 - 16.0 12.6 13.5 13.5  Hematocrit 36 - 46 38 40.9 41.6  Platelets 150 - 399 234 236.0 201.0    Hepatic Function Latest Ref Rng & Units 01/31/2018 09/24/2017 05/17/2017  Total Protein 6.0 - 8.3 g/dL - 7.7 7.5  Albumin 3.5 - 5.2 g/dL - 3.7 3.5  AST 13 - 35 _2 ALT 0 - 35 U/L - 16 13  Alk Phosphatase 25 - 125 126(A) 124(H) 105  Total Bilirubin 0.2 - 1.2 mg/dL - 0.5 0.3    Assessment/Plan:  Pulmonary sarcoidosis. - continue MTX 4 pills weekly with folic acid - bactrim prophylaxis - she is getting insurance again starting in January >> will plan to repeat testing after she has insurance - flu shot today  COPD with chronic bronchitis. - refill dulera - continue singulair and prn albuterol  Tobacco abuse. - didn't like e cigarettes - plans to quit on her own - discussed options to assist with smoking cessation  Obesity. - discussed how her weight could be impacting her breathing   Patient Instructions  Flu shot today  Follow up  in 4 months    Chesley Mires, MD [facility] 08/05/2018, 10:39 AM  Flow Sheet  Pulmonary tests: EBUS with Tbx 03/01/12>>Streptococcus pneumoniae in BAL, cytology/Tbx negative 04/30/12 start prednisone Labs 04/29/13 >> Hep B S Ag negative, Hep B S Ab reactive, Hep B core Ab negative, Hep B core IgM negative, HCV Ab negative 04/30/13 Start MTX  CT chest CT chest 01/23/12>>bulky mediastinal/hilar LAN.  Scattered ill-defined upper lobe predominate perilymphatic and centrilobular nodules. CT chest 04/30/12>>No change in the appearance of the mediastinal and hilar adenopathy and pulmonary nodularity CT chest 07/31/12>>b/l hilar adenopathy no change, b/l upper lobe subpleural nodularity, improved LUL  GGO CT chest 04/14/13 >> peribronchovascular nodularity b/l upper lobes Rt > Lt mildly progressed, no change to mediastinal/hilar adenopathy CT chest 01/05/14 >> upper lobe predominant prebronchovascular reticulo-nodule opacities, 1.2 cm Rt paratracheal LAN, 1.6 cm subcarinal LAN, 1.7 cm Rt hilar LAN  CT chest 10/07/14 >> decreased LAN CT chest 04/26/16 >> atherosclerosis, borderline LAN, patchy nodularity b/l   Pulmonary function testing PFT 03/13/12>>FEV1 1.65 (57%), FEV1% 58, TLC 4.44 (79%), DLCO 62%, no BD PFT 03/18/13 >> FEV1 1.78 (69%), FEV1% 67, TLC 4.04 (75%), DLCO 92%, no BD PFT 01/13/14 >> FEV1 1.64 (70%), FEV1% 70, TLC 3.69 (68%), DLCO 79%  Past Medical History: She  has a past medical history of Anxiety, Asthma, Carpal tunnel syndrome (01/2015), COPD with chronic bronchitis ([place]) (03/13/2012), Depression, Extrinsic asthma, unspecified, Hidradenitis, Hyperlipidemia, Hypertension, Keloid scar, Obesity, Osteoarthritis, Osteoarthrosis, unspecified whether generalized or localized, unspecified site, PONV (postoperative nausea and vomiting), Pulmonary sarcoidosis ([place]) (02/22/2012), Sarcoidosis, Shortness of breath, and Wears dentures.  Past Surgical History: She  has a past surgical history that includes Other surgical history (2010); Multiple tooth extractions (2010); REMOVAL OF GROWTH FROM FINGER; Video bronchoscopy with endobronchial ultrasound (03/01/2012); Open reduction nasal fracture (06/29/2009); and Nasal turbinate reduction (Bilateral, 06/29/2009).  Family History: Her family history includes Arthritis in her mother; Cancer in her father and maternal aunt; Depression in her mother; Diabetes in her maternal aunt; Heart disease in her father; Hyperlipidemia in her mother; Hypertension in her father; Stroke in her mother.  Social History: She  reports that she has been smoking cigarettes and e-cigarettes. She has a 35.00 pack-year smoking history. She has never used smokeless tobacco. She  reports that she drinks alcohol. She reports that she does not use drugs.  Medications: Allergies as of 08/05/2018      Reactions   Codeine Other (See Comments)   Reaction unknown   Cyclinex [tetracycline Hcl] Other (See Comments)   Reaction unknown, all "cyclines"   Milk Thistle Hives   Morphine And Related Other (See Comments)   Reaction unknown      Medication List        Accurate as of 08/05/18 10:39 AM. Always use your most recent med list.          acetaminophen 500 MG tablet Commonly known as:  TYLENOL Take 500 mg by mouth every 6 (six) hours as needed. For pain   ALBUTEROL IN Inhale 2 puffs into the lungs every 4 (four) hours as needed (shortness of breath).   aspirin EC 81 MG tablet Take 1 tablet (81 mg total) by mouth daily.   atorvastatin 40 MG tablet Commonly known as:  LIPITOR Take 1 tablet (40 mg total) by mouth daily.   buPROPion 150 MG 12 hr tablet Commonly known as:  WELLBUTRIN SR Take 1 tablet (150 mg total) by mouth 2 (two) times daily.  calcium carbonate 500 MG chewable tablet Commonly known as:  TUMS - dosed in mg elemental calcium Chew 1 tablet by mouth daily as needed. For indigestion   cephALEXin 500 MG capsule Commonly known as:  KEFLEX Take one (1) capsule (500 mg) by mouth twice daily four (4) times per week.   clindamycin 1 % lotion Commonly known as:  CLEOCIN T Apply 1 application topically 2 (two) times daily as needed (affected areas).   folic acid 1 MG tablet Commonly known as:  FOLVITE TAKE 1 TABLET BY MOUTH EVERY DAY EXCEPT ON DAYS WHEN TAKING METHOTREXATE   GLUCOSAMINE HCL PO Take 1 tablet by mouth 2 (two) times daily.   ipratropium-albuterol 0.5-2.5 (3) MG/3ML Soln Commonly known as:  DUONEB Take 3 mLs by nebulization every 4 (four) hours as needed (shortness of breath).   losartan-hydrochlorothiazide 100-12.5 MG tablet Commonly known as:  HYZAAR Take 1 tablet by mouth daily.   methotrexate 2.5 MG tablet Commonly  known as:  RHEUMATREX Caution:Chemotherapy. Protect from light.   mometasone-formoterol 200-5 MCG/ACT Aero Commonly known as:  DULERA Inhale 2 puffs into the lungs 2 (two) times daily.   montelukast 10 MG tablet Commonly known as:  SINGULAIR Take 1 tablet (10 mg total) by mouth at bedtime.   phentermine 37.5 MG capsule Take 37.5 mg by mouth every morning. PATIENT GETTING THIS FILLED BY BARIATRIC CLINIC   sertraline 25 MG tablet Commonly known as:  ZOLOFT Take 1 tablet (25 mg total) by mouth daily.   sulfamethoxazole-trimethoprim 400-80 MG tablet Commonly known as:  BACTRIM,SEPTRA TAKE 1 TABLET BY MOUTH THREE TIMES A WEEK   TURMERIC PO Take 538 mg by mouth daily.   vitamin B-12 100 MCG tablet Commonly known as:  CYANOCOBALAMIN Take 100 mcg by mouth daily.   VITAMIN D PO Take 1 tablet by mouth once a week.   zinc gluconate 50 MG tablet Take 50 mg by mouth daily.

## 2018-08-05 NOTE — Patient Instructions (Signed)
Flu shot today ° °Follow up in 4 months °

## 2018-08-26 ENCOUNTER — Telehealth: Payer: Self-pay | Admitting: Pulmonary Disease

## 2018-08-26 NOTE — Telephone Encounter (Signed)
ATC pt, no answer. Left message for pt to call back.  

## 2018-08-27 NOTE — Telephone Encounter (Signed)
Called and spoke with pt who stated her mother has shingles and she wants to know if she needs to get the shingles vaccine.  Dr. Craige Cotta, please advise on this for pt. Thanks!

## 2018-08-28 NOTE — Telephone Encounter (Signed)
Called and left message saying patient can get vaccine.   Nothing further needed

## 2018-08-28 NOTE — Telephone Encounter (Signed)
She can get the recombinant zoster vaccine (shingrix).

## 2018-08-29 ENCOUNTER — Telehealth: Payer: Self-pay | Admitting: Pulmonary Disease

## 2018-08-29 NOTE — Telephone Encounter (Signed)
Forms have been filled out and placed in Dr. Evlyn Courier look at. Will route to [facility] for follow up.

## 2018-09-02 ENCOUNTER — Ambulatory Visit: Payer: Self-pay | Admitting: Internal Medicine

## 2018-09-02 ENCOUNTER — Other Ambulatory Visit: Payer: Self-pay | Admitting: Internal Medicine

## 2018-09-02 DIAGNOSIS — I10 Essential (primary) hypertension: Secondary | ICD-10-CM

## 2018-09-02 NOTE — Telephone Encounter (Signed)
VS please advise once forms have been signed and completed. Thank you.

## 2018-09-04 NOTE — Telephone Encounter (Signed)
Forms are completed and signed by VS Forms are placed in the mail today to [employer] Patient Assistance program today. Nothing further needed.

## 2018-09-04 NOTE — Telephone Encounter (Signed)
VS please advise once forms have been completed, thank you.

## 2018-09-04 NOTE — Telephone Encounter (Signed)
This was completed earlier this week.  I believe Kelli picked it up from my office.

## 2018-10-03 ENCOUNTER — Other Ambulatory Visit: Payer: Self-pay | Admitting: Internal Medicine

## 2018-10-03 DIAGNOSIS — F321 Major depressive disorder, single episode, moderate: Secondary | ICD-10-CM

## 2018-10-04 ENCOUNTER — Telehealth: Payer: Self-pay | Admitting: *Deleted

## 2018-10-04 DIAGNOSIS — F321 Major depressive disorder, single episode, moderate: Secondary | ICD-10-CM

## 2018-10-04 MED ORDER — BUPROPION HCL 75 MG PO TABS: 75.0000 mg | ORAL_TABLET | Freq: Two times a day (BID) | ORAL | 3 refills | Status: DC

## 2018-10-04 NOTE — Telephone Encounter (Signed)
Spoke with patient she is aware of the medication change that [facility]

## 2018-10-04 NOTE — Telephone Encounter (Signed)
Patient called and stated that the company she gets her medication, Bupropion SR 150mg  is no longer making the SR or the XL. Patient is requesting to switch to Bupropion 75mg  bid or qid. Please Advise.

## 2018-10-04 NOTE — Telephone Encounter (Signed)
I have sent a new prescription to [facility] for the bupropion 75mg  po bid in place of the 150mg  SR.

## 2018-10-07 ENCOUNTER — Ambulatory Visit: Payer: Self-pay | Admitting: Internal Medicine

## 2018-10-31 ENCOUNTER — Other Ambulatory Visit: Payer: Self-pay | Admitting: Internal Medicine

## 2018-10-31 DIAGNOSIS — Z1231 Encounter for screening mammogram for malignant neoplasm of breast: Secondary | ICD-10-CM

## 2018-11-04 ENCOUNTER — Other Ambulatory Visit: Payer: Self-pay | Admitting: Pulmonary Disease

## 2018-11-04 ENCOUNTER — Other Ambulatory Visit: Payer: Self-pay | Admitting: Internal Medicine

## 2018-11-04 DIAGNOSIS — F321 Major depressive disorder, single episode, moderate: Secondary | ICD-10-CM

## 2018-11-07 ENCOUNTER — Ambulatory Visit: Payer: Self-pay | Admitting: Internal Medicine

## 2018-11-29 ENCOUNTER — Ambulatory Visit
Admission: RE | Admit: 2018-11-29 | Discharge: 2018-11-29 | Disposition: A | Discharge status: Home / Self Care | Payer: BLUE CROSS/BLUE SHIELD | Source: Ambulatory Visit | Attending: Internal Medicine | Admitting: Internal Medicine

## 2018-11-29 ENCOUNTER — Other Ambulatory Visit: Payer: Self-pay | Admitting: Internal Medicine

## 2018-11-29 DIAGNOSIS — Z1231 Encounter for screening mammogram for malignant neoplasm of breast: Secondary | ICD-10-CM

## 2018-12-02 ENCOUNTER — Encounter: Payer: Self-pay | Admitting: *Deleted

## 2018-12-09 ENCOUNTER — Ambulatory Visit (INDEPENDENT_AMBULATORY_CARE_PROVIDER_SITE_OTHER): Payer: BLUE CROSS/BLUE SHIELD | Admitting: Internal Medicine

## 2018-12-09 ENCOUNTER — Encounter: Payer: Self-pay | Admitting: Internal Medicine

## 2018-12-09 VITALS — BP 142/82 | HR 68 | Temp 97.6°F | Resp 10 | Ht 67.0 in | Wt 307.8 lb

## 2018-12-09 DIAGNOSIS — R600 Localized edema: Secondary | ICD-10-CM

## 2018-12-09 DIAGNOSIS — J449 Chronic obstructive pulmonary disease, unspecified: Secondary | ICD-10-CM

## 2018-12-09 DIAGNOSIS — F321 Major depressive disorder, single episode, moderate: Secondary | ICD-10-CM | POA: Diagnosis not present

## 2018-12-09 DIAGNOSIS — R635 Abnormal weight gain: Secondary | ICD-10-CM | POA: Diagnosis not present

## 2018-12-09 DIAGNOSIS — Z124 Encounter for screening for malignant neoplasm of cervix: Secondary | ICD-10-CM

## 2018-12-09 DIAGNOSIS — I10 Essential (primary) hypertension: Secondary | ICD-10-CM

## 2018-12-09 DIAGNOSIS — N393 Stress incontinence (female) (male): Secondary | ICD-10-CM

## 2018-12-09 DIAGNOSIS — D860 Sarcoidosis of lung: Secondary | ICD-10-CM

## 2018-12-09 DIAGNOSIS — J4489 Other specified chronic obstructive pulmonary disease: Secondary | ICD-10-CM

## 2018-12-09 DIAGNOSIS — Z1211 Encounter for screening for malignant neoplasm of colon: Secondary | ICD-10-CM

## 2018-12-09 DIAGNOSIS — E7800 Pure hypercholesterolemia, unspecified: Secondary | ICD-10-CM

## 2018-12-09 MED ORDER — PHENTERMINE HCL 37.5 MG PO CAPS: 37.5000 mg | ORAL_CAPSULE | ORAL | 0 refills | Status: DC

## 2018-12-09 MED ORDER — FUROSEMIDE 20 MG PO TABS: 20.0000 mg | ORAL_TABLET | Freq: Every day | ORAL | 0 refills | Status: DC

## 2018-12-09 NOTE — Patient Instructions (Addendum)
Start to wear your compression hose or socks regularly.  Kegel Exercises Kegel exercises help strengthen the muscles that support the rectum, vagina, small intestine, bladder, and uterus. Doing Kegel exercises can help:  Improve bladder and bowel control.  Improve sexual response.  Reduce problems and discomfort during pregnancy. Kegel exercises involve squeezing your pelvic floor muscles, which are the same muscles you squeeze when you try to stop the flow of urine. The exercises can be done while sitting, standing, or lying down, but it is best to vary your position. Exercises 1. Squeeze your pelvic floor muscles tight. You should feel a tight lift in your rectal area. If you are a female, you should also feel a tightness in your vaginal area. Keep your stomach, buttocks, and legs relaxed. 2. Hold the muscles tight for up to 10 seconds. 3. Relax your muscles. Repeat this exercise 50 times a day or as many times as told by your health care provider. Continue to do this exercise for at least 4-6 weeks or for as long as told by your health care provider. This information is not intended to replace advice given to you by your health care provider. Make sure you discuss any questions you have with your health care provider. Document Released: 10/02/2012 Document Revised: 02/26/2017 Document Reviewed: 09/05/2015 Elsevier Interactive Patient Education  2019 [employer].

## 2018-12-15 MED ORDER — ATORVASTATIN CALCIUM 40 MG PO TABS: 40.0000 mg | ORAL_TABLET | Freq: Every day | ORAL | 3 refills | Status: DC

## 2018-12-15 NOTE — Progress Notes (Signed)
Location:  [facility] clinic Provider:  Arminta Gamm L. Renato Gails, D.O., C.M.D.   Goals of Care:  Advanced Directives 12/09/2018  Does Patient Have a Medical Advance Directive? No  Would patient like information on creating a medical advance directive? No - Patient declined  Pre-existing out of facility DNR order (yellow form or pink MOST form) -     Chief Complaint  Patient presents with  . Medical Management of Chronic Issues    4 month follow-up, discuss weight gain   . Best Practice Recommendations    Discuss need for Pap and Colonoscopy   . Medication Management    Discuss Wellbutrin and what time to take B/P medication     HPI: Patient is a 53 y.o. female seen today for medical management of chronic diseases.    Weight gain--she is working third shift so she is eating at night.  She's gaining weight and both ankles are quite swollen.  Has to sit at work and there's nothing to elevate feet on.  Wants lasix.  Wants to get back on phentermine.  Wants to do this one month at a time.  She's not eating a lot.  Thinks she needs to eat more--eats breakfast, then eats around 2pm.  Then takes nap before work and then eating at work.  Stopped this weekend--got chicken salad or tuna salad with crackers.  Is trying to stop sodas.  She's drinking plenty of water.  Stopped going to previous bariatric center b/c her dad was sick.  Does not want to go to a new bariatric program/weight loss program anymore.  Discussed compression socks--says the sizes weren't staying up.    Then she comes home after work, is tired and sleeping all the time.  Thinks she has some depression.  Has bupropion 75mg  and not helping as much.  Feels like XR was doing better.    Pap smear:  Wants to get done.    Did get her mammogram which was wnl .    Cscope still needs to be done.  Agrees to go.  Did not have insurance at one point and since getting it, has not made arrangements.  Wellbutrin SR--pt has this already instead of  short-acting.   There is one brand that does not seem to work at all--purple Dr. Betti Cruz brand.  BP meds:  Ok to take at any point when she will remember.    She's having stress incontinence.  Has to go up a long hill to work.  Didn't have her dulera for a year and when she got to the top of the hill, was sob and peed on herself--leakage.    Past Medical History:  Diagnosis Date  . Anxiety   . Asthma    inhalers   . Carpal tunnel syndrome 01/2015   right  . 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 Depression   . Extrinsic asthma, unspecified   . Hidradenitis   . Hyperlipidemia    takes pravachol  . Hypertension   . Keloid scar   . Obesity   . Osteoarthritis   . Osteoarthrosis, unspecified whether generalized or localized, unspecified site   . PONV (postoperative nausea and vomiting)   . 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  Sarcoidosis   . Shortness of breath    exertion  . Wears dentures    top    Past Surgical History:  Procedure Laterality Date  . MULTIPLE TOOTH EXTRACTIONS  2010  . NASAL TURBINATE REDUCTION Bilateral 06/29/2009   inferior  . OPEN REDUCTION NASAL FRACTURE  06/29/2009   with exc. bx. left intranasal papilloma  . OTHER SURGICAL HISTORY  2010   cyst removal from right middle finger    . REMOVAL OF GROWTH FROM FINGER     DR Metro Kung  . VIDEO BRONCHOSCOPY WITH ENDOBRONCHIAL ULTRASOUND  03/01/2012    Allergies  Allergen Reactions  . Codeine Other (See Comments)    Reaction unknown  . Cyclinex [Tetracycline Hcl] Other (See Comments)    Reaction unknown, all "cyclines"  . Milk Thistle Hives  . Morphine And Related Other (See Comments)    Reaction unknown     Outpatient Encounter Medications as of 12/09/2018  Medication Sig  . acetaminophen (TYLENOL) 500 MG tablet Take 500 mg by mouth every 6 (six) hours as needed. For pain  . ALBUTEROL IN Inhale 2 puffs into the lungs every 4 (four) hours as needed (shortness of breath).  Marland Kitchen aspirin EC 81 MG tablet Take 1 tablet (81 mg total) by mouth daily.  Marland Kitchen buPROPion (WELLBUTRIN SR) 150 MG 12 hr tablet Take 150 mg by mouth 2 (two) times daily.  . calcium carbonate (TUMS - DOSED IN MG ELEMENTAL CALCIUM) 500 MG chewable tablet Chew 1 tablet by mouth daily as needed. For indigestion  . cephALEXin (KEFLEX) 500 MG capsule Take one (1) capsule (500 mg) by mouth twice daily four (4) times per week.  . Cholecalciferol (VITAMIN D PO) Take 1 tablet by mouth once a week.  . clindamycin (CLEOCIN T) 1 % lotion Apply 1 application topically 2 (two) times daily as needed (affected areas).  . folic acid (FOLVITE) 1 MG tablet TAKE 1 TABLET BY MOUTH EVERY DAY EXCEPT ON DAYS WHEN TAKING METHOTREXATE  . GLUCOSAMINE HCL PO Take 1 tablet by mouth 2 (two) times daily.  Marland Kitchen ipratropium-albuterol (DUONEB) 0.5-2.5 (3) MG/3ML SOLN Take 3 mLs by nebulization every 4 (four) hours as needed (shortness of breath).  . losartan-hydrochlorothiazide (HYZAAR) 100-12.5 MG tablet TAKE 1 TABLET BY MOUTH DAILY  . methotrexate (RHEUMATREX) 2.5 MG tablet TAKE 4 TABLETS BY MOUTH WEEKLY( CAUTION CHEMOTHERAPY PROTECT FROM LIGHT)  . mometasone-formoterol (DULERA) 200-5 MCG/ACT AERO Inhale 2 puffs into the lungs 2 (two) times daily.  . montelukast (SINGULAIR) 10 MG tablet Take 1 tablet (10 mg total) by mouth at bedtime.  . phentermine 37.5 MG capsule Take 1 capsule (37.5 mg total) by mouth every morning.  . sertraline (ZOLOFT) 25 MG tablet TAKE 1 TABLET(25 MG) BY MOUTH DAILY  . sulfamethoxazole-trimethoprim (BACTRIM,SEPTRA) 400-80 MG tablet TAKE 1 TABLET BY MOUTH THREE TIMES A WEEK  . TURMERIC PO Take 538 mg by mouth daily.  . vitamin B-12 (CYANOCOBALAMIN)  100 MCG tablet Take 100 mcg by mouth daily.  . [DISCONTINUED] phentermine 37.5 MG capsule Take 37.5 mg by mouth every morning.   Marland Kitchen atorvastatin (LIPITOR) 40 MG tablet Take 1 tablet (40 mg total) by mouth daily. (Patient not taking: Reported on 12/09/2018)  . furosemide (LASIX) 20 MG tablet Take 1 tablet (20 mg total) by mouth daily for 3 days.  . [DISCONTINUED] buPROPion (WELLBUTRIN) 75 MG tablet Take 1 tablet (75 mg total) by mouth 2 (two) times daily. (Patient not taking: Reported on 12/09/2018)  . [DISCONTINUED] zinc gluconate  50 MG tablet Take 50 mg by mouth daily.   No facility-administered encounter medications on file as of 12/09/2018.     Review of Systems:  Review of Systems  Constitutional: Negative for chills, diaphoresis, fever and malaise/fatigue.       Wt gain--eating unhealthy food at work and apparently high sodium foods  HENT: Negative for hearing loss.   Eyes: Negative for blurred vision.  Respiratory: Positive for shortness of breath and wheezing. Negative for cough and sputum production.   Cardiovascular: Positive for leg swelling. Negative for chest pain, palpitations, orthopnea, claudication and PND.  Gastrointestinal: Negative for abdominal pain, blood in stool, constipation, diarrhea and melena.  Genitourinary: Positive for urgency. Negative for dysuria, flank pain, frequency and hematuria.       Stress incontinence  Musculoskeletal: Positive for joint pain. Negative for falls.  Skin: Negative for itching and rash.  Neurological: Negative for loss of consciousness and headaches.  Endo/Heme/Allergies: Does not bruise/bleed easily.  Psychiatric/Behavioral: Positive for depression. Negative for memory loss. The patient is not nervous/anxious and does not have insomnia.     Health Maintenance  Topic Date Due  . HIV Screening  03/19/1980  . PAP SMEAR-Modifier  03/19/1986  . COLONOSCOPY  03/20/2015  . MAMMOGRAM  11/29/2020  . TETANUS/TDAP  12/29/2026  . INFLUENZA  VACCINE  Completed    Physical Exam: Vitals:   12/09/18 0838  BP: (!) 142/82  Pulse: 68  Resp: 10  Temp: 97.6 F (36.4 C)  TempSrc: Oral  SpO2: 97%  Weight: (!) 307 lb 12.8 oz (139.6 kg)  Height: 5\' 7"  (1.702 m)   Body mass index is 48.21 kg/m. Physical Exam Vitals signs reviewed.  Constitutional:      General: She is not in acute distress.    Appearance: Normal appearance. She is obese. She is not ill-appearing or toxic-appearing.  HENT:     Head: Normocephalic and atraumatic.  Cardiovascular:     Rate and Rhythm: Normal rate and regular rhythm.     Pulses: Normal pulses.     Heart sounds: Normal heart sounds.  Pulmonary:     Effort: Pulmonary effort is normal. No respiratory distress.     Breath sounds: Rhonchi present. No wheezing or rales.     Comments: Continues to vape Abdominal:     General: Bowel sounds are normal.     Palpations: Abdomen is soft. There is no mass.     Tenderness: There is no abdominal tenderness.  Musculoskeletal: Normal range of motion.  Skin:    General: Skin is warm.  Neurological:     General: No focal deficit present.     Mental Status: She is alert and oriented to person, place, and time.     Cranial Nerves: No cranial nerve deficit.  Psychiatric:     Comments: Quieter and not smiling and nearly giddy as she sometimes is at visits     Labs reviewed: Basic Metabolic Panel: Recent Labs    01/31/18 06/03/18 1027  NA 146 138  K 5.2 3.9  CL  --  102  CO2  --  29  GLUCOSE  --  94  BUN 8 13  CREATININE 1.1 1.12*  CALCIUM  --  9.1  TSH 1.52  --    Liver Function Tests: Recent Labs    01/31/18  AST 25  ALKPHOS 126*   No results for input(s): LIPASE, AMYLASE in the last 8760 hours. No results for input(s): AMMONIA in the last 8760 hours. CBC:  Recent Labs    01/31/18  WBC 8.3  NEUTROABS 6  HGB 12.6  HCT 38  PLT 234   Lipid Panel: Recent Labs    01/31/18  CHOL 154  HDL 54  LDLCALC 89  TRIG 57   Lab Results   Component Value Date   HGBA1C 5.4 12/28/2016    Procedures since last visit: Mm 3d Screen Breast Bilateral  Result Date: 11/29/2018 CLINICAL DATA:  Screening. EXAM: DIGITAL SCREENING BILATERAL MAMMOGRAM WITH TOMO AND CAD COMPARISON:  Previous exam(s). ACR Breast Density Category b: There are scattered areas of fibroglandular density. FINDINGS: There are no findings suspicious for malignancy. Images were processed with CAD. IMPRESSION: No mammographic evidence of malignancy. A result letter of this screening mammogram will be mailed directly to the patient. RECOMMENDATION: Screening mammogram in one year. (Code:SM-B-01Y) BI-RADS CATEGORY  1: Negative. Electronically Signed   By: Ted Mcalpineobrinka  Dimitrova M.D.   On: 11/29/2018 08:44    Assessment/Plan 1. Cervical cancer screening -agrees to return to gyn for a pap smear now that she has insurance coverage--importance of following through on preventative care strategies emphasized - Ambulatory referral to Gynecology  2. Weight gain - ongoing issue--weight is way back up with recent job change and eating habits - agreed to prescribe phentermine for her now that she had a cardiology eval for her dyspnea on exertion and since she agrees to come in monthly for weights and visits with [facility] to keep her on task - phentermine 37.5 MG capsule; Take 1 capsule (37.5 mg total) by mouth every morning.  Dispense: 30 capsule; Refill: 0  3. Depression, major, single episode, moderate (HCC) -return to prior wellbutrin as she did not tolerate the new version (see hpi for details)--has been more down since change  4. Localized edema -likely due to poor food choices, not wearing compression hose recommended and not exercising - as edema was pitting and cardiac workup was negative for cardiac etiology, did give just 3 days of lasix again to help with diuresis, but must change diet and wear hose to make a difference, also needs exercise, but start with the first two  items  - furosemide (LASIX) 20 MG tablet; Take 1 tablet (20 mg total) by mouth daily for 3 days.  Dispense: 3 tablet; Refill: 0 -already on regular diuretic in her hyzaar so this should not be continued  5. Pulmonary sarcoidosis -followed by pulmonary, but continues to vape also which is not helping her respiratory status and her brother and mother still smoke cigarettes some -cont methotrexate, folic acid  6. Morbid obesity due to excess calories (HCC) - will do phentermine, but MUST adhere with monthly visits for weight checks and see NPs to ensure she's on task -does not want to go to a weight loss center--if she's going to go she wants to go back where she went before not to the [facility] weight loss center - phentermine 37.5 MG capsule; Take 1 capsule (37.5 mg total) by mouth every morning.  Dispense: 30 capsule; Refill: 0  7. COPD with chronic bronchitis (HCC) -again, compounding her sarcoid, must use inhalers as directed and stop vaping, lose weight  8. Pure hypercholesterolemia -ongoing, resume lipitor   9. Benign essential HTN -cont current bp regimen, decrease salt intake in prepared foods and fast food  10. Stress incontinence, female -due in part to deconditioning and weight gain -kegel exercises explained and handout given -again, weight loss strategies discussed  11. Screening for colon cancer - Ambulatory referral to  Gastroenterology--says she will go this time due to having insurance  Labs/tests ordered:   Orders Placed This Encounter  Procedures  . Ambulatory referral to Gynecology    Referral Priority:   Routine    Referral Type:   Consultation    Referral Reason:   Specialty Services Required    Requested Specialty:   Gynecology    Number of Visits Requested:   1  . Ambulatory referral to Gastroenterology    Referral Priority:   Routine    Referral Type:   Consultation    Referral Reason:   Specialty Services Required    Number of Visits Requested:   1    Next appt:  01/09/2019   Ivone Licht L. Aryianna Earwood, D.O. Geriatrics [facility] [address], [place] [address] Cell Phone (Mon-Fri 8am-5pm):  [phone] On Call:  [phone] & follow prompts after 5pm & weekends Office Phone:  [phone] Office Fax:  [phone]

## 2018-12-16 ENCOUNTER — Telehealth: Payer: Self-pay | Admitting: *Deleted

## 2018-12-16 NOTE — Telephone Encounter (Signed)
appt moved to NP schedule

## 2019-01-09 ENCOUNTER — Ambulatory Visit: Payer: BLUE CROSS/BLUE SHIELD | Admitting: Internal Medicine

## 2019-01-09 ENCOUNTER — Ambulatory Visit: Payer: BLUE CROSS/BLUE SHIELD | Admitting: Nurse Practitioner

## 2019-01-15 ENCOUNTER — Ambulatory Visit: Payer: BLUE CROSS/BLUE SHIELD | Admitting: Internal Medicine

## 2019-01-16 ENCOUNTER — Ambulatory Visit (INDEPENDENT_AMBULATORY_CARE_PROVIDER_SITE_OTHER): Payer: BLUE CROSS/BLUE SHIELD | Admitting: Nurse Practitioner

## 2019-01-16 ENCOUNTER — Encounter: Payer: Self-pay | Admitting: Nurse Practitioner

## 2019-01-16 ENCOUNTER — Other Ambulatory Visit: Payer: Self-pay

## 2019-01-16 DIAGNOSIS — D860 Sarcoidosis of lung: Secondary | ICD-10-CM | POA: Diagnosis not present

## 2019-01-16 DIAGNOSIS — I10 Essential (primary) hypertension: Secondary | ICD-10-CM | POA: Diagnosis not present

## 2019-01-16 NOTE — Progress Notes (Signed)
Careteam: Patient Care Team: Kermit Balo, DO as PCP - General (Geriatric Medicine)  Advanced Directive information Does Patient Have a Medical Advance Directive?: No, Would patient like information on creating a medical advance directive?: No - Patient declined  Allergies  Allergen Reactions  . Codeine Other (See Comments)    Reaction unknown  . Cyclinex [Tetracycline Hcl] Other (See Comments)    Reaction unknown, all "cyclines"  . Milk Thistle Hives  . Morphine And Related Other (See Comments)    Reaction unknown    Chief Complaint  Patient presents with  . Medical Management of Chronic Issues    1 month follow up with weight   . Quality Metric Gaps    colonoscopy, pap smear     HPI: Patient is a 53 y.o. female seen in the office today for follow up weight.  She was prescribed phentermine by Dr Renato Gails, PCP and agreed to monthly checks to keep her accountable.   Reports she was doing her diet plan until she saw her boyfriend last week. She traveled to [place]. She went out to eat a lot.  Generally reports she eats 1200 calories but does not check labels routinely   Not doing any exercise. 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   States she was down 8 lbs and now gained 3 lbs back.  States she is using old phentermine Rx, still has one on file.   She was previously seen at a bariatric clinic and if she decides to go anywhere this is where she will go.   htn- not taking blood pressure medication as she should.   Has been referred to GYN for PAP Review of Systems:  Review of Systems  Constitutional: Negative for chills, diaphoresis, fever and malaise/fatigue.       Has had Wt gain--eating unhealthy food at work and apparently high sodium foods- now with some weight loss  HENT: Negative for hearing loss.   Eyes: Negative for blurred vision.  Respiratory: Positive for shortness of breath and wheezing. Negative for cough and sputum production.    Worsening shortness of breath/wheezing following with pulmonary  Cardiovascular: Positive for leg swelling. Negative for chest pain, palpitations, orthopnea, claudication and PND.  Genitourinary: Negative for dysuria, flank pain, frequency and hematuria.  Musculoskeletal: Positive for joint pain. Negative for falls.  Skin: Negative for itching and rash.  Neurological: Negative for loss of consciousness and headaches.  Endo/Heme/Allergies: Does not bruise/bleed easily.  Psychiatric/Behavioral: Negative for memory loss. The patient is not nervous/anxious and does not have insomnia.     Past Medical History:  Diagnosis Date  . Anxiety   . Asthma    inhalers   . Carpal tunnel syndrome 01/2015   right  . 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 Depression   . Extrinsic asthma, unspecified   . Hidradenitis   . Hyperlipidemia    takes pravachol  . Hypertension   . Keloid scar   . Obesity   . Osteoarthritis   . Osteoarthrosis, unspecified whether generalized or localized, unspecified site   . PONV (postoperative nausea and vomiting)   . 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 Sarcoidosis   . Shortness of breath  exertion  . Wears dentures    top   Past Surgical History:  Procedure Laterality Date  . MULTIPLE TOOTH EXTRACTIONS  2010  . NASAL TURBINATE REDUCTION Bilateral 06/29/2009   inferior  . OPEN REDUCTION NASAL FRACTURE  06/29/2009   with exc. bx. left intranasal papilloma  . OTHER SURGICAL HISTORY  2010   cyst removal from right middle finger    . REMOVAL OF GROWTH FROM FINGER     DR Metro Kung  . VIDEO BRONCHOSCOPY WITH ENDOBRONCHIAL ULTRASOUND  03/01/2012   Social History:   reports that  she has been smoking cigarettes and e-cigarettes. She has a 35.00 pack-year smoking history. She has never used smokeless tobacco. She reports current alcohol use. She reports that she does not use drugs.  Family History  Problem Relation Age of Onset  . Hyperlipidemia Mother   . Stroke Mother   . Arthritis Mother   . Depression Mother   . Heart disease Father   . Hypertension Father   . Cancer Father   . Cancer Maternal Aunt        mother's aunt  . Diabetes Maternal Aunt     Medications: Patient's Medications  New Prescriptions   No medications on file  Previous Medications   ACETAMINOPHEN (TYLENOL) 500 MG TABLET    Take 500 mg by mouth every 6 (six) hours as needed. For pain   ALBUTEROL IN    Inhale 2 puffs into the lungs every 4 (four) hours as needed (shortness of breath).   ASPIRIN EC 81 MG TABLET    Take 1 tablet (81 mg total) by mouth daily.   ATORVASTATIN (LIPITOR) 40 MG TABLET    Take 1 tablet (40 mg total) by mouth daily.   BUPROPION (WELLBUTRIN SR) 150 MG 12 HR TABLET    Take 150 mg by mouth 2 (two) times daily.   CALCIUM CARBONATE (TUMS - DOSED IN MG ELEMENTAL CALCIUM) 500 MG CHEWABLE TABLET    Chew 1 tablet by mouth daily as needed. For indigestion   CEPHALEXIN (KEFLEX) 500 MG CAPSULE    Take one (1) capsule (500 mg) by mouth twice daily four (4) times per week.   CHOLECALCIFEROL (VITAMIN D PO)    Take 1 tablet by mouth once a week.   CLINDAMYCIN (CLEOCIN T) 1 % LOTION    Apply 1 application topically 2 (two) times daily as needed (affected areas).   FOLIC ACID (FOLVITE) 1 MG TABLET    TAKE 1 TABLET BY MOUTH EVERY DAY EXCEPT ON DAYS WHEN TAKING METHOTREXATE   FUROSEMIDE (LASIX) 20 MG TABLET    Take 1 tablet (20 mg total) by mouth daily for 3 days.   GLUCOSAMINE HCL PO    Take 1 tablet by mouth 2 (two) times daily.   IPRATROPIUM-ALBUTEROL (DUONEB) 0.5-2.5 (3) MG/3ML SOLN    Take 3 mLs by nebulization every 4 (four) hours as needed (shortness of breath).    LOSARTAN-HYDROCHLOROTHIAZIDE (HYZAAR) 100-12.5 MG TABLET    TAKE 1 TABLET BY MOUTH DAILY   METHOTREXATE (RHEUMATREX) 2.5 MG TABLET    TAKE 4 TABLETS BY MOUTH WEEKLY( CAUTION CHEMOTHERAPY PROTECT FROM LIGHT)   MOMETASONE-FORMOTEROL (DULERA) 200-5 MCG/ACT AERO    Inhale 2 puffs into the lungs 2 (two) times daily.   MONTELUKAST (SINGULAIR) 10 MG TABLET    Take 1 tablet (10 mg total) by mouth at bedtime.   PHENTERMINE 37.5 MG CAPSULE    Take 1 capsule (37.5 mg total) by mouth every morning.  SERTRALINE (ZOLOFT) 25 MG TABLET    TAKE 1 TABLET(25 MG) BY MOUTH DAILY   SULFAMETHOXAZOLE-TRIMETHOPRIM (BACTRIM,SEPTRA) 400-80 MG TABLET    TAKE 1 TABLET BY MOUTH THREE TIMES A WEEK   TURMERIC PO    Take 538 mg by mouth daily.   VITAMIN B-12 (CYANOCOBALAMIN) 100 MCG TABLET    Take 100 mcg by mouth daily.  Modified Medications   No medications on file  Discontinued Medications   No medications on file     Physical Exam:  Vitals:   01/16/19 0841  BP: 130/86  Pulse: 66  Temp: 98 F (36.7 C)  TempSrc: Oral  SpO2: 98%  Weight: (!) 302 lb 3.2 oz (137.1 kg)  Height:  (1.702 m)   Body mass index is 47.33 kg/m.  Physical Exam Vitals signs reviewed.  Constitutional:      General: She is not in acute distress.    Appearance: Normal appearance. She is obese. She is not ill-appearing or toxic-appearing.  HENT:     Head: Normocephalic and atraumatic.  Cardiovascular:     Rate and Rhythm: Normal rate and regular rhythm.     Pulses: Normal pulses.     Heart sounds: Normal heart sounds.  Pulmonary:     Effort: Pulmonary effort is normal. No respiratory distress.     Breath sounds: No wheezing or rales.     Comments: diminished throughout Abdominal:     General: Bowel sounds are normal.     Palpations: Abdomen is soft. There is no mass.     Tenderness: There is no abdominal tenderness.  Musculoskeletal: Normal range of motion.  Skin:    General: Skin is warm.  Neurological:     General: No  focal deficit present.     Mental Status: She is alert and oriented to person, place, and time.     Cranial Nerves: No cranial nerve deficit.    Labs reviewed: Basic Metabolic Panel: Recent Labs    01/31/18 06/03/18 1027  NA 146 138  K 5.2 3.9  CL  --  102  CO2  --  29  GLUCOSE  --  94  BUN 8 13  CREATININE 1.1 1.12*  CALCIUM  --  9.1  TSH 1.52  --    Liver Function Tests: Recent Labs    01/31/18  AST 25  ALKPHOS 126*   No results for input(s): LIPASE, AMYLASE in the last 8760 hours. No results for input(s): AMMONIA in the last 8760 hours. CBC: Recent Labs    01/31/18  WBC 8.3  NEUTROABS 6  HGB 12.6  HCT 38  PLT 234   Lipid Panel: Recent Labs    01/31/18  CHOL 154  HDL 54  LDLCALC 89  TRIG 57   TSH: Recent Labs    01/31/18  TSH 1.52   A1C: Lab Results  Component Value Date   HGBA1C 5.4 12/28/2016     Assessment/Plan 1. Morbid obesity due to excess calories (HCC) Continues on phentermine. Has had some benefit, not following meal plan. Discussed weight loss strategies such as meal planning (low fat/DASH diet), looking at labels, avoiding fasting foods and sodas. Encouraged to increase physical activity as tolerates. Encouraged support group/educational group to help assist with positive weight loss.   2. Benign essential HTN Stable, states she just restarted her medications. To continue medication with dietary modifications.   3. Pulmonary sarcoidosis Ongoing, plans to follow up with pulmonary for better management of her symptoms.  Total time 25  mins:  time greater than 50% of total time spent doing pt counseling and coordination of care for weight loss.   Next appt: 1 month for weight loss  Clayborne Divis K. Biagio BorgEubanks, AGNP  [facility] [phone]

## 2019-01-17 ENCOUNTER — Other Ambulatory Visit: Payer: Self-pay | Admitting: Pulmonary Disease

## 2019-01-17 DIAGNOSIS — J449 Chronic obstructive pulmonary disease, unspecified: Secondary | ICD-10-CM

## 2019-01-17 DIAGNOSIS — D860 Sarcoidosis of lung: Secondary | ICD-10-CM

## 2019-02-06 ENCOUNTER — Other Ambulatory Visit: Payer: Self-pay | Admitting: Internal Medicine

## 2019-02-14 ENCOUNTER — Telehealth: Payer: Self-pay | Admitting: Pulmonary Disease

## 2019-02-14 MED ORDER — SULFAMETHOXAZOLE-TRIMETHOPRIM 400-80 MG PO TABS: ORAL_TABLET | ORAL | 0 refills | Status: DC

## 2019-02-14 MED ORDER — ALBUTEROL SULFATE HFA 108 (90 BASE) MCG/ACT IN AERS: 2.0000 | INHALATION_SPRAY | Freq: Four times a day (QID) | RESPIRATORY_TRACT | 2 refills | Status: DC | PRN

## 2019-02-14 MED ORDER — FOLIC ACID 1 MG PO TABS: 1.0000 mg | ORAL_TABLET | Freq: Every day | ORAL | 2 refills | Status: DC

## 2019-02-14 MED ORDER — MOMETASONE FURO-FORMOTEROL FUM 200-5 MCG/ACT IN AERO: 2.0000 | INHALATION_SPRAY | Freq: Two times a day (BID) | RESPIRATORY_TRACT | 5 refills | Status: DC

## 2019-02-14 MED ORDER — METHOTREXATE 2.5 MG PO TABS: ORAL_TABLET | ORAL | 1 refills | Status: DC

## 2019-02-14 NOTE — Telephone Encounter (Signed)
All requested prescriptions have been filled for pt and sent to pt's preferred pharmacy. Called and spoke with pt letting her know this had been done. Pt expressed understanding. Nothing further needed.

## 2019-02-17 ENCOUNTER — Other Ambulatory Visit: Payer: Self-pay

## 2019-02-17 ENCOUNTER — Encounter: Payer: Self-pay | Admitting: Internal Medicine

## 2019-02-17 ENCOUNTER — Ambulatory Visit (INDEPENDENT_AMBULATORY_CARE_PROVIDER_SITE_OTHER): Payer: BLUE CROSS/BLUE SHIELD | Admitting: Internal Medicine

## 2019-02-17 DIAGNOSIS — Z1211 Encounter for screening for malignant neoplasm of colon: Secondary | ICD-10-CM | POA: Diagnosis not present

## 2019-02-17 DIAGNOSIS — F321 Major depressive disorder, single episode, moderate: Secondary | ICD-10-CM | POA: Diagnosis not present

## 2019-02-17 DIAGNOSIS — Z124 Encounter for screening for malignant neoplasm of cervix: Secondary | ICD-10-CM | POA: Diagnosis not present

## 2019-02-17 MED ORDER — PHENTERMINE HCL 37.5 MG PO CAPS: 37.5000 mg | ORAL_CAPSULE | ORAL | 0 refills | Status: DC

## 2019-02-17 MED ORDER — LEVOMILNACIPRAN HCL ER 20 & 40 MG PO C4PK: EXTENDED_RELEASE_CAPSULE | ORAL | 0 refills | Status: DC

## 2019-02-17 NOTE — Progress Notes (Signed)
Patient ID: Kaitlyn [name], female   DOB: Sep 07, 1966, 53 y.o.   MRN: [id number] This service is provided via telemedicine  No vital signs collected/recorded due to the encounter was a telemedicine visit.   Location of patient (ex: home, work):  home  Patient consents to a telephone visit:  yes  Location of the provider (ex: office, home):  office  Name of any referring provider:  Dr. Bufford Spikesiffany Nareg Breighner, DO  Names of all persons participating in the telemedicine service and their role in the encounter:  Patient, Mervin HackeShannon Smith, CMA, Dr. Bufford Spikesiffany Chrisha Vogel, DO  Time spent on call:  7:15    Provider:  Juvenal Umar L. Renato Gailseed, D.O., C.M.D.  Goals of Care:  Advanced Directives 01/16/2019  Does Patient Have a Medical Advance Directive? No  Would patient like information on creating a medical advance directive? No - Patient declined  Pre-existing out of facility DNR order (yellow form or pink MOST form) -     Chief Complaint  Patient presents with  . telephone visit    4 week follow-up, weight    HPI: Patient is a 53 y.o. female seen today for medical management of chronic diseases.    She says she tried to decrease her weight on her own w/o phentermine.  Asks about foskolin supplement for weight loss--I advised that given it is not an FDA-approved med, we cannot be certain it will not interact with her other meds..    She did drink alcohol on two occasions before the covid business started.  She eats one meal per day.  Will have popcorn, a slim jim.  She thinks she is more depressed.  Having to stay in really sucks.  She's had to cancel trips she had planned.    At one time she reports I prescribed a med for depression that was newer.  It did not cause symptoms of decreased libido.  It had been $300.  She now has insurance.    She also plans to go to gyn we referred her to before-- was c/o stress incontinence last time and still had not had routine pap.     lipitor is giving her bad shoulder and  muscle pain and it makes her urine smell.  It was prescribed by cardiology and she's going to call them back.    Not having leg swelling now.  Counseled on slim jims having high sodium.    Goal is to lose 2 lbs per week over the next 4 weeks.  Weight today was 298 lbs at home.    Past Medical History:  Diagnosis Date  . Anxiety   . Asthma    inhalers   . Carpal tunnel syndrome 01/2015   right  . 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 Depression   . Extrinsic asthma, unspecified   . Hidradenitis   . Hyperlipidemia    takes pravachol  . Hypertension   . Keloid scar   . Obesity   . Osteoarthritis   . Osteoarthrosis, unspecified whether generalized or localized, unspecified site   . PONV (postoperative nausea and vomiting)   . 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 Sarcoidosis   . Shortness of breath    exertion  . Wears dentures  top    Past Surgical History:  Procedure Laterality Date  . MULTIPLE TOOTH EXTRACTIONS  2010  . NASAL TURBINATE REDUCTION Bilateral 06/29/2009   inferior  . OPEN REDUCTION NASAL FRACTURE  06/29/2009   with exc. bx. left intranasal papilloma  . OTHER SURGICAL HISTORY  2010   cyst removal from right middle finger    . REMOVAL OF GROWTH FROM FINGER     DR Metro Kung  . VIDEO BRONCHOSCOPY WITH ENDOBRONCHIAL ULTRASOUND  03/01/2012    Allergies  Allergen Reactions  . Codeine Other (See Comments)    Reaction unknown  . Cyclinex [Tetracycline Hcl] Other (See Comments)    Reaction unknown, all "cyclines"  . Milk Thistle Hives  . Morphine And Related Other (See Comments)    Reaction unknown    Outpatient Encounter Medications as of 02/17/2019  Medication Sig  .  acetaminophen (TYLENOL) 500 MG tablet Take 500 mg by mouth every 6 (six) hours as needed. For pain  . albuterol (VENTOLIN HFA) 108 (90 Base) MCG/ACT inhaler Inhale 2 puffs into the lungs every 6 (six) hours as needed for wheezing or shortness of breath.  Marland Kitchen aspirin EC 81 MG tablet Take 1 tablet (81 mg total) by mouth daily.  Marland Kitchen atorvastatin (LIPITOR) 40 MG tablet Take 1 tablet (40 mg total) by mouth daily.  Marland Kitchen buPROPion (WELLBUTRIN SR) 150 MG 12 hr tablet TAKE 1 TABLET BY MOUTH TWICE DAILY  . calcium carbonate (TUMS - DOSED IN MG ELEMENTAL CALCIUM) 500 MG chewable tablet Chew 1 tablet by mouth daily as needed. For indigestion  . cephALEXin (KEFLEX) 500 MG capsule Take one (1) capsule (500 mg) by mouth twice daily four (4) times per week.  . Cholecalciferol (VITAMIN D PO) Take 1 tablet by mouth once a week.  . clindamycin (CLEOCIN T) 1 % lotion Apply 1 application topically 2 (two) times daily as needed (affected areas).  . folic acid (FOLVITE) 1 MG tablet Take 1 tablet (1 mg total) by mouth daily.  Marland Kitchen GLUCOSAMINE HCL PO Take 1 tablet by mouth 2 (two) times daily.  Marland Kitchen ipratropium-albuterol (DUONEB) 0.5-2.5 (3) MG/3ML SOLN Take 3 mLs by nebulization every 4 (four) hours as needed (shortness of breath).  . losartan-hydrochlorothiazide (HYZAAR) 100-12.5 MG tablet TAKE 1 TABLET BY MOUTH DAILY  . methotrexate (RHEUMATREX) 2.5 MG tablet TAKE 4 TABLETS BY MOUTH WEEKLY( CAUTION CHEMOTHERAPY PROTECT FROM LIGHT)  . mometasone-formoterol (DULERA) 200-5 MCG/ACT AERO Inhale 2 puffs into the lungs 2 (two) times daily.  . montelukast (SINGULAIR) 10 MG tablet TAKE 1 TABLET(10 MG) BY MOUTH AT BEDTIME  . phentermine 37.5 MG capsule Take 1 capsule (37.5 mg total) by mouth every morning.  . sertraline (ZOLOFT) 25 MG tablet TAKE 1 TABLET(25 MG) BY MOUTH DAILY  . sulfamethoxazole-trimethoprim (BACTRIM) 400-80 MG tablet TAKE 1 TABLET BY MOUTH THREE TIMES A WEEK  . TURMERIC PO Take 538 mg by mouth daily.  . vitamin B-12  (CYANOCOBALAMIN) 100 MCG tablet Take 200 mcg by mouth daily.   . [DISCONTINUED] ALBUTEROL IN Inhale 2 puffs into the lungs every 4 (four) hours as needed (shortness of breath).  . [DISCONTINUED] furosemide (LASIX) 20 MG tablet Take 1 tablet (20 mg total) by mouth daily for 3 days.   No facility-administered encounter medications on file as of 02/17/2019.     Review of Systems:  Review of Systems  Constitutional: Negative for chills, fever and malaise/fatigue.  HENT: Negative for hearing loss.   Eyes: Negative for blurred vision.  Respiratory: Positive for  cough and shortness of breath. Negative for sputum production and wheezing.   Cardiovascular: Negative for chest pain, palpitations and leg swelling.  Gastrointestinal: Negative for abdominal pain.  Genitourinary: Negative for dysuria.  Musculoskeletal: Positive for joint pain. Negative for falls.  Skin: Negative for rash.  Neurological: Negative for dizziness and loss of consciousness.  Endo/Heme/Allergies: Does not bruise/bleed easily.  Psychiatric/Behavioral: Positive for depression. Negative for memory loss. The patient is not nervous/anxious and does not have insomnia.     Health Maintenance  Topic Date Due  . HIV Screening  03/19/1980  . PAP SMEAR-Modifier  03/19/1986  . COLONOSCOPY  03/20/2015  . INFLUENZA VACCINE  05/31/2019  . MAMMOGRAM  11/29/2020  . TETANUS/TDAP  12/29/2026    Physical Exam: Could not be performed as visit non face-to-face via phone   Labs reviewed: Basic Metabolic Panel: Recent Labs    06/03/18 1027  NA 138  K 3.9  CL 102  CO2 29  GLUCOSE 94  BUN 13  CREATININE 1.12*  CALCIUM 9.1   Liver Function Tests: No results for input(s): AST, ALT, ALKPHOS, BILITOT, PROT, ALBUMIN in the last 8760 hours. No results for input(s): LIPASE, AMYLASE in the last 8760 hours. No results for input(s): AMMONIA in the last 8760 hours. CBC: No results for input(s): WBC, NEUTROABS, HGB, HCT, MCV, PLT in the  last 8760 hours. Lipid Panel: No results for input(s): CHOL, HDL, LDLCALC, TRIG, CHOLHDL, LDLDIRECT in the last 8760 hours. Lab Results  Component Value Date   HGBA1C 5.4 12/28/2016    Procedures since last visit: No results found.  Assessment/Plan 1. Morbid obesity due to excess calories (HCC) - weight down 4.2 lbs reportedly w/o phentermine--says she did not fill it last time -continue to work on dietary changes and needs to exercise -counseled to increase veggies and fruits and eat on a regular schedule not skipping meals and then making unhealthy choices - phentermine 37.5 MG capsule; Take 1 capsule (37.5 mg total) by mouth every morning.  Dispense: 30 capsule; Refill: 0  2. Cervical cancer screening -gave her name of gyn practice where we referred her long ago for pap and pelvic examination   3. Screening for colon cancer -has been referred to [facility] but has not heard yet about appt for colonoscopy--explained that they are not doing routine ones at present, but she can call and get on the schedule for future perhaps when things begin to open up  4. Depression, major, single episode, moderate (HCC) -d/c zoloft which can cause weight gain and she does not feel it's been effective -cont wellbutrin -add fetzima in place of zoloft-hopefully insurance covers well  - Levomilnacipran HCl ER (FETZIMA TITRATION) 20 & 40 MG C4PK; As directed on titration pack  Dispense: 28 each; Refill: 0  Labs/tests ordered:  No new Next appt:  03/17/2019 Non face-to-face time spent on televisit:  23 minutes  Asmar Brozek L. Lorell Thibodaux, D.O. Geriatrics [facility] [address] [place], [place] [address] Cell Phone (Mon-Fri 8am-5pm):  [phone] On Call:  [phone] & follow prompts after 5pm & weekends Office Phone:  [phone] Office Fax:  [phone]

## 2019-02-19 ENCOUNTER — Telehealth: Payer: Self-pay | Admitting: *Deleted

## 2019-02-19 NOTE — Telephone Encounter (Signed)
Received Prior Authorization for Phentermine. Initiated through [employer].  Key: [id number] BIN: [id number] Grp: [id number] ID: [id number]  Awaiting Determination.

## 2019-02-20 ENCOUNTER — Telehealth: Payer: Self-pay

## 2019-02-20 NOTE — Telephone Encounter (Signed)
Received fax from [facility] stating Phentermine was DENIED due to using an interacting medication. Reference #[id number] Placed denial in Dr. Ernest Mallick folder to review.

## 2019-02-20 NOTE — Telephone Encounter (Signed)
Virtual Visit Pre-Appointment Phone Call  "(Name), I am calling you today to discuss your upcoming appointment. We are currently trying to limit exposure to the virus that causes COVID-19 by seeing patients at home rather than in the office."  1. "What is the BEST phone number to call the day of the visit?" - include this in appointment notes  2. "Do you have or have access to (through a family member/friend) a smartphone with video capability that we can use for your visit?" a. If yes - list this number in appt notes as "cell" (if different from BEST phone #) and list the appointment type as a VIDEO visit in appointment notes b. If no - list the appointment type as a PHONE visit in appointment notes  3. Confirm consent - "In the setting of the current Covid19 crisis, you are scheduled for a (phone or video) visit with your provider on (date) at (time).  Just as we do with many in-office visits, in order for you to participate in this visit, we must obtain consent.  If you'd like, I can send this to your mychart (if signed up) or email for you to review.  Otherwise, I can obtain your verbal consent now.  All virtual visits are billed to your insurance company just like a normal visit would be.  By agreeing to a virtual visit, we'd like you to understand that the technology does not allow for your provider to perform an examination, and thus may limit your provider's ability to fully assess your condition. If your provider identifies any concerns that need to be evaluated in person, we will make arrangements to do so.  Finally, though the technology is pretty good, we cannot assure that it will always work on either your or our end, and in the setting of a video visit, we may have to convert it to a phone-only visit.  In either situation, we cannot ensure that we have a secure connection.  Are you willing to proceed?" STAFF: Did the patient verbally acknowledge consent to telehealth visit? Document  YES/NO here: YES  4. Advise patient to be prepared - "Two hours prior to your appointment, go ahead and check your blood pressure, pulse, oxygen saturation, and your weight (if you have the equipment to check those) and write them all down. When your visit starts, your provider will ask you for this information. If you have an Apple Watch or Kardia device, please plan to have heart rate information ready on the day of your appointment. Please have a pen and paper handy nearby the day of the visit as well."  5. Give patient instructions for MyChart download to smartphone OR Doximity/Doxy.me as below if video visit (depending on what platform provider is using)  6. Inform patient they will receive a phone call 15 minutes prior to their appointment time (may be from unknown caller ID) so they should be prepared to answer    TELEPHONE CALL NOTE  Kaitlyn [name] has been deemed a candidate for a follow-up tele-health visit to limit community exposure during the Covid-19 pandemic. I spoke with the patient via phone to ensure availability of phone/video source, confirm preferred email & phone number, and discuss instructions and expectations.  I reminded Michael LitterCheri Y Kesling to be prepared with any vital sign and/or heart rhythm information that could potentially be obtained via home monitoring, at the time of her visit. I reminded Michael LitterCheri Y Slifer to expect a phone call prior to  her visit.  Lajoyce Corners, CMA 02/20/2019 4:58 PM   INSTRUCTIONS FOR DOWNLOADING THE MYCHART APP TO SMARTPHONE  - The patient must first make sure to have activated MyChart and know their login information - If Apple, go to [employer] and type in MyChart in the search bar and download the app. If Android, ask patient to go to [employer] and type in [place] in the search bar and download the app. The app is free but as with any other app downloads, their phone may require them to verify saved payment information or  Apple/Android password.  - The patient will need to then log into the app with their MyChart username and password, and select [facility] as their healthcare provider to link the account. When it is time for your visit, go to the MyChart app, find appointments, and click Begin Video Visit. Be sure to Select Allow for your device to access the Microphone and Camera for your visit. You will then be connected, and your provider will be with you shortly.  **If they have any issues connecting, or need assistance please contact MyChart service desk [phone])**  **If using a computer, in order to ensure the best quality for their visit they will need to use either of the following Internet Browsers: [employer] or Google Chrome**  IF USING DOXIMITY or DOXY.ME - The patient will receive a link just prior to their visit by text.     FULL LENGTH CONSENT FOR TELE-HEALTH VISIT   I hereby voluntarily request, consent and authorize [facility] and its employed or contracted physicians, physician assistants, nurse practitioners or other licensed health care professionals (the Practitioner), to provide me with telemedicine health care services (the "Services") as deemed necessary by the treating Practitioner. I acknowledge and consent to receive the Services by the Practitioner via telemedicine. I understand that the telemedicine visit will involve communicating with the Practitioner through live audiovisual communication technology and the disclosure of certain medical information by electronic transmission. I acknowledge that I have been given the opportunity to request an in-person assessment or other available alternative prior to the telemedicine visit and am voluntarily participating in the telemedicine visit.  I understand that I have the right to withhold or withdraw my consent to the use of telemedicine in the course of my care at any time, without affecting my right to future care  or treatment, and that the Practitioner or I may terminate the telemedicine visit at any time. I understand that I have the right to inspect all information obtained and/or recorded in the course of the telemedicine visit and may receive copies of available information for a reasonable fee.  I understand that some of the potential risks of receiving the Services via telemedicine include:  Marland Kitchen Delay or interruption in medical evaluation due to technological equipment failure or disruption; . Information transmitted may not be sufficient (e.g. poor resolution of images) to allow for appropriate medical decision making by the Practitioner; and/or  . In rare instances, security protocols could fail, causing a breach of personal health information.  Furthermore, I acknowledge that it is my responsibility to provide information about my medical history, conditions and care that is complete and accurate to the best of my ability. I acknowledge that Practitioner's advice, recommendations, and/or decision may be based on factors not within their control, such as incomplete or inaccurate data provided by me or distortions of diagnostic images or specimens that may result from electronic transmissions.  I understand that the practice of medicine is not an exact science and that Practitioner makes no warranties or guarantees regarding treatment outcomes. I acknowledge that I will receive a copy of this consent concurrently upon execution via email to the email address I last provided but may also request a printed copy by calling the office of [facility].    I understand that my insurance will be billed for this visit.   I have read or had this consent read to me. . I understand the contents of this consent, which adequately explains the benefits and risks of the Services being provided via telemedicine.  . I have been provided ample opportunity to ask questions regarding this consent and the Services and have had  my questions answered to my satisfaction. . I give my informed consent for the services to be provided through the use of telemedicine in my medical care  By participating in this telemedicine visit I agree to the above.

## 2019-02-21 ENCOUNTER — Encounter: Payer: Self-pay | Admitting: Internal Medicine

## 2019-02-21 ENCOUNTER — Other Ambulatory Visit: Payer: Self-pay

## 2019-02-21 ENCOUNTER — Telehealth (INDEPENDENT_AMBULATORY_CARE_PROVIDER_SITE_OTHER): Payer: BLUE CROSS/BLUE SHIELD | Admitting: Internal Medicine

## 2019-02-21 VITALS — Ht 67.0 in | Wt 301.0 lb

## 2019-02-21 DIAGNOSIS — I2510 Atherosclerotic heart disease of native coronary artery without angina pectoris: Secondary | ICD-10-CM

## 2019-02-21 DIAGNOSIS — E782 Mixed hyperlipidemia: Secondary | ICD-10-CM

## 2019-02-21 DIAGNOSIS — D860 Sarcoidosis of lung: Secondary | ICD-10-CM

## 2019-02-21 DIAGNOSIS — I10 Essential (primary) hypertension: Secondary | ICD-10-CM

## 2019-02-21 MED ORDER — ROSUVASTATIN CALCIUM 5 MG PO TABS: 5.0000 mg | ORAL_TABLET | Freq: Every day | ORAL | 3 refills | Status: DC

## 2019-02-21 NOTE — Patient Instructions (Signed)
Medication Instructions:  Your physician has recommended you make the following change in your medication:   1.) stop atorvastatin  2.) start rosuvastatin (Crestor) 5mg  once a day for cholesterol--sent to [facility]  If you need a refill on your cardiac medications before your next appointment, please call your pharmacy.   Lab work: We will check your labs in about 2-3 months. (bmet, cbc, tsh, lipids) If you have labs (blood work) drawn today and your tests are completely normal, you will receive your results only by: Marland Kitchen MyChart Message (if you have MyChart) OR . A paper copy in the mail If you have any lab test that is abnormal or we need to change your treatment, we will call you to review the results.  Testing/Procedures: none  Follow-Up: At [facility], you and your health needs are our priority.  As part of our continuing mission to provide you with exceptional heart care, we have created designated Provider Care Teams.  These Care Teams include your primary Cardiologist (physician) and Advanced Practice Providers (APPs -  Physician Assistants and Nurse Practitioners) who all work together to provide you with the care you need, when you need it. You will need a follow up appointment in: November- 6 months.  Please call our office 2 months in advance to schedule this appointment.  You may see Dietrich Pates, MD or one of the following Advanced Practice Providers on your designated Care Team: Tereso Newcomer, PA-C [place], [place] . Berton Bon, NP  Any Other Special Instructions Will Be Listed Below (If Applicable).

## 2019-02-21 NOTE — Progress Notes (Signed)
Virtual Visit via Telephone Note   This visit type was conducted due to national recommendations for restrictions regarding the COVID-19 Pandemic (e.g. social distancing) in an effort to limit this patient's exposure and mitigate transmission in our community.  Due to her co-morbid illnesses, this patient is at least at moderate risk for complications without adequate follow up.  This format is felt to be most appropriate for this patient at this time.  The patient did not have access to video technology/had technical difficulties with video requiring transitioning to audio format only (telephone).  All issues noted in this document were discussed and addressed.  No physical exam could be performed with this format.  Please refer to the patient's chart for her  consent to telehealth for [facility].   Evaluation Performed:  Follow-up visit  Date:  02/21/2019   ID:  Kaitlyn [name], DOB August 08, 1966, MRN [id number]  Patient Location: Home Provider Location: Home  PCP:  Kermit Balo, DO Cardiologist:  Tenny Craw Electrophysiologist:  None   Chief Complaint:  F/U of HTN and CAD   History of Present Illness:    Kaitlyn [name] is a 53 y.o. female with hx of morbid obesity, sarcoid, HL, bronchitis, depression, OA, HTN and tob use CT with plaquing in coronary arteries    Seen in Sept   Pt says she is doing pretty good    She denies CP   Breathing is OK   No swelling   She says she tried lipitor   Got achy, joints hurt and she stopped  The patient does not:[id number]} have symptoms concerning for COVID-19 infection (fever, chills, cough, or new shortness of breath).    Past Medical History:  Diagnosis Date  . Anxiety   . Asthma    inhalers   . Carpal tunnel syndrome 01/2015   right  . 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 Depression   . Extrinsic asthma, unspecified   . Hidradenitis   . Hyperlipidemia     takes pravachol  . Hypertension   . Keloid scar   . Obesity   . Osteoarthritis   . Osteoarthrosis, unspecified whether generalized or localized, unspecified site   . PONV (postoperative nausea and vomiting)   . 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 Sarcoidosis   . Shortness of breath    exertion  . Wears dentures    top   Past Surgical History:  Procedure Laterality Date  . MULTIPLE TOOTH EXTRACTIONS  2010  . NASAL TURBINATE REDUCTION Bilateral 06/29/2009   inferior  . OPEN REDUCTION NASAL FRACTURE  06/29/2009   with exc. bx. left intranasal papilloma  . OTHER SURGICAL HISTORY  2010   cyst removal from right middle finger    . REMOVAL OF GROWTH FROM FINGER     DR Metro Kung  . VIDEO BRONCHOSCOPY WITH ENDOBRONCHIAL ULTRASOUND  03/01/2012     Current Meds  Medication Sig  . acetaminophen (TYLENOL) 500 MG tablet Take 500 mg by mouth every 6 (six) hours as needed. For pain  . albuterol (VENTOLIN HFA) 108 (90 Base) MCG/ACT inhaler Inhale 2 puffs into the lungs every 6 (six) hours as needed for wheezing or shortness of breath.  Marland Kitchen aspirin EC 81 MG  tablet Take 1 tablet (81 mg total) by mouth daily.  Marland Kitchen. atorvastatin (LIPITOR) 40 MG tablet Take 1 tablet (40 mg total) by mouth daily.  Marland Kitchen. buPROPion (WELLBUTRIN SR) 150 MG 12 hr tablet TAKE 1 TABLET BY MOUTH TWICE DAILY  . calcium carbonate (TUMS - DOSED IN MG ELEMENTAL CALCIUM) 500 MG chewable tablet Chew 1 tablet by mouth daily as needed. For indigestion  . cephALEXin (KEFLEX) 500 MG capsule Take one (1) capsule (500 mg) by mouth twice daily four (4) times per week.  . Cholecalciferol (VITAMIN D PO) Take 1 tablet by mouth once a week.  . clindamycin (CLEOCIN T) 1 % lotion Apply 1 application topically 2 (two) times daily as needed (affected  areas).  . folic acid (FOLVITE) 1 MG tablet Take 1 tablet (1 mg total) by mouth daily.  Marland Kitchen. GLUCOSAMINE HCL PO Take 1 tablet by mouth 2 (two) times daily.  Marland Kitchen. ipratropium-albuterol (DUONEB) 0.5-2.5 (3) MG/3ML SOLN Take 3 mLs by nebulization every 4 (four) hours as needed (shortness of breath).  . Levomilnacipran HCl ER (FETZIMA TITRATION) 20 & 40 MG C4PK As directed on titration pack  . losartan-hydrochlorothiazide (HYZAAR) 100-12.5 MG tablet TAKE 1 TABLET BY MOUTH DAILY  . methotrexate (RHEUMATREX) 2.5 MG tablet TAKE 4 TABLETS BY MOUTH WEEKLY( CAUTION CHEMOTHERAPY PROTECT FROM LIGHT)  . mometasone-formoterol (DULERA) 200-5 MCG/ACT AERO Inhale 2 puffs into the lungs 2 (two) times daily.  . montelukast (SINGULAIR) 10 MG tablet TAKE 1 TABLET(10 MG) BY MOUTH AT BEDTIME  . phentermine 37.5 MG capsule Take 1 capsule (37.5 mg total) by mouth every morning.  . sulfamethoxazole-trimethoprim (BACTRIM) 400-80 MG tablet TAKE 1 TABLET BY MOUTH THREE TIMES A WEEK  . TURMERIC PO Take 538 mg by mouth daily.  . vitamin B-12 (CYANOCOBALAMIN) 100 MCG tablet Take 200 mcg by mouth daily.      Allergies:   Codeine; Cyclinex [tetracycline hcl]; Milk thistle; and Morphine and related   Social History   Tobacco Use  . Smoking status: Former Smoker    Packs/day: 1.00    Years: 35.00    Pack years: 35.00    Types: Cigarettes, E-cigarettes    Last attempt to quit: 01/22/2012    Years since quitting: 7.0  . Smokeless tobacco: Never Used  . Tobacco comment: using ecig daily--06/21/16  Substance Use Topics  . Alcohol use: Yes    Comment: occasionally  . Drug use: No     Family Hx: The patient's family history includes Arthritis in her mother; Cancer in her father and maternal aunt; Depression in her mother; Diabetes in her maternal aunt; Heart disease in her father; Hyperlipidemia in her mother; Hypertension in her father; Stroke in her mother.  ROS:   Please see the history of present illness.     All other  systems reviewed and are negative.   Prior CV studies:   The following studies were reviewed today:    Labs/Other Tests and Data Reviewed:    EKG: NOt done as tele visit  Recent Labs: 06/03/2018: Brain Natriuretic Peptide 16; BUN 13; Creat 1.12; Potassium 3.9; Sodium 138   Recent Lipid Panel Lab Results  Component Value Date/Time   CHOL 154 01/31/2018   CHOL 168 02/25/2016 09:29 AM   TRIG 57 01/31/2018   HDL 54 01/31/2018   HDL 54 02/25/2016 09:29 AM   CHOLHDL 3.0 12/28/2016 10:13 AM   LDLCALC 89 01/31/2018   LDLCALC 99 02/25/2016 09:29 AM    Wt Readings from Last 3 Encounters:  02/21/19 (!) 301 lb (136.5 kg)  02/17/19 298 lb (135.2 kg)  01/16/19 (!) 302 lb 3.2 oz (137.1 kg)     Objective:    Vital Signs:  Ht 5\' 7"  (1.702 m)   Wt (!) 301 lb (136.5 kg)   BMI 47.14 kg/m   Not done as tele visit    ASSESSMENT & PLAN:    1. HTN   BP has been OK   Did not take today  2  HL  Did not tolerate lipitor   Will try Crestor 5 mg   Check lipds in July   3   CAD   No symtpoms of angina     4   Sarcoid   Needs to be vigilant with current COVID pandemic  5   COVID-19 Education: The signs and symptoms of COVID-19 were discussed with the patient and how to seek care for testing (follow up with PCP or arrange E-visit).  The importance of social distancing was discussed today.  Time:   Today, I have spent 25 minutes with the patient with telehealth technology discussing the above problems.     Medication Adjustments/Labs and Tests Ordered: Current medicines are reviewed at length with the patient today.  Concerns regarding medicines are outlined above.   Tests Ordered: No orders of the defined types were placed in this encounter.   Medication Changes: No orders of the defined types were placed in this encounter.   Disposition:  Follow up next November  Labs in late June , early July.  Signed, Dietrich Pates, MD  02/21/2019 8:41 AM    [facility]

## 2019-03-07 ENCOUNTER — Other Ambulatory Visit: Payer: Self-pay | Admitting: Internal Medicine

## 2019-03-13 ENCOUNTER — Telehealth: Payer: Self-pay | Admitting: *Deleted

## 2019-03-13 NOTE — Telephone Encounter (Signed)
Spoke with patient and advised results appt scheduled 

## 2019-03-13 NOTE — Telephone Encounter (Signed)
Patient called and stated that her mother slid off the bed this morning and when she tried to pick her up she hurt her shoulder. Pain unrelieved by OTC pain medication. Patient is requesting a refill on her Tramadol. Medication is not in Current medication list, it is in her history. Stated that it helps with her pain. Please Advise.   [facility]: [facility]

## 2019-03-13 NOTE — Telephone Encounter (Signed)
Tried calling back to set up a telephone visit. No answer. LMOM. Will try again later if still no call back.

## 2019-03-13 NOTE — Telephone Encounter (Signed)
Let's set her up for a tele or ideally virtual visit about her shoulder if she agrees.  She should use tylenol in the interim and ice.

## 2019-03-14 ENCOUNTER — Encounter: Payer: Self-pay | Admitting: Family

## 2019-03-14 ENCOUNTER — Ambulatory Visit (INDEPENDENT_AMBULATORY_CARE_PROVIDER_SITE_OTHER): Payer: BLUE CROSS/BLUE SHIELD | Admitting: Family

## 2019-03-14 ENCOUNTER — Other Ambulatory Visit: Payer: Self-pay

## 2019-03-14 DIAGNOSIS — M25512 Pain in left shoulder: Secondary | ICD-10-CM

## 2019-03-14 DIAGNOSIS — J449 Chronic obstructive pulmonary disease, unspecified: Secondary | ICD-10-CM

## 2019-03-14 DIAGNOSIS — M25562 Pain in left knee: Secondary | ICD-10-CM

## 2019-03-14 DIAGNOSIS — J4489 Other specified chronic obstructive pulmonary disease: Secondary | ICD-10-CM

## 2019-03-14 DIAGNOSIS — D860 Sarcoidosis of lung: Secondary | ICD-10-CM

## 2019-03-14 DIAGNOSIS — M25561 Pain in right knee: Secondary | ICD-10-CM | POA: Diagnosis not present

## 2019-03-14 DIAGNOSIS — G8929 Other chronic pain: Secondary | ICD-10-CM

## 2019-03-14 MED ORDER — DICLOFENAC SODIUM 1 % TD GEL: 2.0000 g | Freq: Four times a day (QID) | TRANSDERMAL | 0 refills | Status: DC

## 2019-03-14 MED ORDER — TRAMADOL HCL 50 MG PO TABS: 50.0000 mg | ORAL_TABLET | Freq: Four times a day (QID) | ORAL | 0 refills | Status: DC | PRN

## 2019-03-14 MED ORDER — LOSARTAN POTASSIUM-HCTZ 100-12.5 MG PO TABS: 1.0000 | ORAL_TABLET | Freq: Every day | ORAL | 1 refills | Status: DC

## 2019-03-14 MED ORDER — MONTELUKAST SODIUM 10 MG PO TABS: ORAL_TABLET | ORAL | 3 refills | Status: DC

## 2019-03-14 MED ORDER — BUPROPION HCL ER (SR) 150 MG PO TB12: 150.0000 mg | ORAL_TABLET | Freq: Two times a day (BID) | ORAL | 5 refills | Status: DC

## 2019-03-14 NOTE — Progress Notes (Signed)
This service is provided via telemedicine  No vital signs collected/recorded due to the encounter was a telemedicine visit.   Location of patient (ex: home, work):  Home  Patient consents to a telephone visit:  Yes   Location of the provider (ex: office, home):  Office   Name of any referring provider: Dr. Bufford Spikesiffany Reed  Names of all persons participating in the telemedicine service and their role in the encounter:  Asher MuirMelissa Wilson CMA, Averie Hornbaker NP, Lestine Boxheri Hopple  Time spent on call:  Asher MuirMelissa Wilson CMA  Spent  9 Minutes with patient on phone.  .  Provider: Liyat Faulkenberry FNP-C  Kermit Baloeed, Tiffany L, DO  Patient Care Team: Kermit Baloeed, Tiffany L, DO as PCP - General (Geriatric Medicine) Pricilla Riffleoss, Paula V, MD as PCP - Cardiology (Cardiology)  Extended Emergency Contact Information Primary Emergency Contact: Julieta GuttingBennett Jr,Jerry Address: [address]          [place], [place] [id number] [place] Home Phone: [phone] Relation: Brother   Goals of care: Advanced Directive information Advanced Directives 01/16/2019  Does Patient Have a Medical Advance Directive? No  Would patient like information on creating a medical advance directive? No - Patient declined  Pre-existing out of facility DNR order (yellow form or pink MOST form) -     Chief Complaint  Patient presents with   Acute Visit    Patient hurt left shoulder duration of 2 days ago lifting mom     HPI:  Pt is a 53 y.o. 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 She has used warm compressor and tylenol without any relief.she request her refill for her voltaren gel and tramadol to be refilled  states used some tramadol that she had left over.she denies any swelling,redness or loss of sensation.    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 bx. left intranasal papilloma   OTHER SURGICAL HISTORY  2010   cyst removal from right middle finger     REMOVAL OF GROWTH  FROM FINGER     DR MEYERDIERKS   VIDEO BRONCHOSCOPY WITH ENDOBRONCHIAL ULTRASOUND  03/01/2012    Allergies  Allergen Reactions   Codeine Other (See Comments)    Reaction unknown   Cyclinex [Tetracycline Hcl] Other (See Comments)    Reaction unknown, all "cyclines"   Milk Thistle Hives   Morphine And Related Other (See Comments)    Reaction unknown    Outpatient Encounter Medications as of 03/14/2019  Medication Sig   acetaminophen (TYLENOL) 500 MG tablet Take 500 mg by mouth every 6 (six) hours as needed. For pain   albuterol (VENTOLIN HFA) 108 (90 Base) MCG/ACT inhaler Inhale 2 puffs into the  lungs every 6 (six) hours as needed for wheezing or shortness of breath.   aspirin EC 81 MG tablet Take 1 tablet (81 mg total) by mouth daily.   buPROPion (WELLBUTRIN SR) 150 MG 12 hr tablet Take 1 tablet (150 mg total) by mouth 2 (two) times daily.   calcium carbonate (TUMS - DOSED IN MG ELEMENTAL CALCIUM) 500 MG chewable tablet Chew 1 tablet by mouth daily as needed. For indigestion   cephALEXin (KEFLEX) 500 MG capsule Take one (1) capsule (500 mg) by mouth twice daily four (4) times per week.   Cholecalciferol (VITAMIN D PO) Take 2 tablets by mouth once a week.    clindamycin (CLEOCIN T) 1 % lotion Apply 1 application topically 2 (two) times daily as needed (affected areas).   diclofenac sodium (VOLTAREN) 1 % GEL Apply 2 g topically 4 (four) times daily. Apply to knees four times daily   folic acid (FOLVITE) 1 MG tablet Take 1 tablet (1 mg total) by mouth daily.   GLUCOSAMINE HCL PO Take 1 tablet by mouth 2 (two) times daily.   ipratropium-albuterol (DUONEB) 0.5-2.5 (3) MG/3ML SOLN Take 3 mLs by nebulization every 4 (four) hours as needed (shortness of breath).   losartan-hydrochlorothiazide (HYZAAR) 100-12.5 MG tablet Take 1 tablet by mouth daily.   methotrexate (RHEUMATREX) 2.5 MG tablet TAKE 4 TABLETS BY MOUTH WEEKLY( CAUTION CHEMOTHERAPY PROTECT FROM LIGHT)   mometasone-formoterol (DULERA) 200-5 MCG/ACT AERO Inhale 2 puffs into the lungs 2 (two) times daily.   montelukast (SINGULAIR) 10 MG tablet TAKE 1 TABLET(10 MG) BY MOUTH AT BEDTIME   phentermine 37.5 MG capsule Take 1 capsule (37.5 mg total) by mouth every morning. (Patient taking differently: Take 37.5 mg by mouth every morning. Tab)   rosuvastatin (CRESTOR) 5 MG tablet Take 1 tablet (5 mg total) by mouth daily.   sulfamethoxazole-trimethoprim (BACTRIM) 400-80 MG tablet TAKE 1 TABLET BY MOUTH THREE TIMES A WEEK   traMADol (ULTRAM) 50 MG tablet Take 1 tablet (50 mg total) by mouth every 6 (six) hours as needed for  moderate pain.   TURMERIC PO Take 538 mg by mouth 2 (two) times a day.    vitamin B-12 (CYANOCOBALAMIN) 100 MCG tablet Take 200 mcg by mouth daily. 1000 x2   [DISCONTINUED] buPROPion (WELLBUTRIN SR) 150 MG 12 hr tablet TAKE 1 TABLET BY MOUTH TWICE DAILY   [DISCONTINUED] diclofenac sodium (VOLTAREN) 1 % GEL Apply 2 g topically 4 (four) times daily.   [DISCONTINUED] losartan-hydrochlorothiazide (HYZAAR) 100-12.5 MG tablet Take 1 tablet by mouth daily.   [DISCONTINUED] montelukast (SINGULAIR) 10 MG tablet TAKE 1 TABLET(10 MG) BY MOUTH AT BEDTIME   [DISCONTINUED] traMADol (ULTRAM) 50 MG tablet Take 50 mg by mouth every 6 (six) hours as needed for moderate pain.   [DISCONTINUED] Levomilnacipran HCl ER (FETZIMA TITRATION) 20 & 40  MG C4PK As directed on titration pack   [DISCONTINUED] losartan-hydrochlorothiazide (HYZAAR) 100-12.5 MG tablet TAKE 1 TABLET BY MOUTH DAILY   No facility-administered encounter medications on file as of 03/14/2019.     Review of Systems  Constitutional: Negative for chills, fatigue and fever.  HENT: Negative for congestion, rhinorrhea, sinus pressure, sinus pain, sneezing and sore throat.   Respiratory:       Hx COPD  Musculoskeletal: Positive for arthralgias.       Left shoulder pain 8/10 on scale with movement. Chronic knee pain due to osteoarthritis  Skin: Negative for color change, pallor, rash and wound.  Neurological: Negative for dizziness, light-headedness and headaches.    Immunization History  Administered Date(s) Administered   Influenza Whole 10/31/2011, 08/15/2013   Influenza,inj,Quad PF,6+ Mos 09/21/2014, 07/12/2015, 08/29/2016, 09/24/2017, 08/05/2018   Pneumococcal Conjugate-13 09/24/2017   Pneumococcal Polysaccharide-23 02/21/2010, 07/30/2012   Td 12/28/2016   Pertinent  Health Maintenance Due  Topic Date Due   PAP SMEAR-Modifier  03/19/1986   COLONOSCOPY  03/20/2015   INFLUENZA VACCINE  05/31/2019   MAMMOGRAM  11/29/2020    Fall Risk  02/17/2019 12/09/2018 05/30/2018 02/07/2018 12/28/2016  Falls in the past year? 0 0 No No No  Number falls in past yr: 0 0 - - -  Injury with Fall? 0 0 - - -   There were no vitals filed for this visit. There is no height or weight on file to calculate BMI. Physical Exam  Unable to complete on telephone visit.   Labs reviewed: Recent Labs    06/03/18 1027  NA 138  K 3.9  CL 102  CO2 29  GLUCOSE 94  BUN 13  CREATININE 1.12*  CALCIUM 9.1    Lab Results  Component Value Date   TSH 1.52 01/31/2018   Lab Results  Component Value Date   HGBA1C 5.4 12/28/2016   Lab Results  Component Value Date   CHOL 154 01/31/2018   HDL 54 01/31/2018   LDLCALC 89 01/31/2018   TRIG 57 01/31/2018   CHOLHDL 3.0 12/28/2016    Significant Diagnostic Results in last 30 days:  No results found.  Assessment/Plan 1. Acute pain of left shoulder Intermittent pain due to lifting mother off the floor.decline x-ray states does not think it's broken.uses hand without any difficulties.continue on warm compressor and OTC tylenol as needed.Notify provider if symptoms not resolved or worsen.   2. Chronic pain of both knees Chronic pain.refilled Voltaren gel one application four times daily and Tramadol 50 mg tablet one by mouth every 6 hours as needed for pain # 60 with no refill.   3. Pulmonary sarcoidosis States breathing stable.request refill for Singulair  - montelukast (SINGULAIR) 10 MG tablet; TAKE 1 TABLET(10 MG) BY MOUTH AT BEDTIME  Dispense: 90 tablet; Refill: 3  4. COPD with chronic bronchitis (HCC) States breathing stable. - montelukast (SINGULAIR) 10 MG tablet; TAKE 1 TABLET(10 MG) BY MOUTH AT BEDTIME  Dispense: 90 tablet; Refill: 3  Labs/tests ordered: None  Next appointment: as needed.   Time spent with patient 15 minutes >50% time spent counseling; reviewing medical record; tests; labs; and developing future plan of care  Caesar Bookman, NP

## 2019-03-17 ENCOUNTER — Other Ambulatory Visit: Payer: Self-pay

## 2019-03-17 ENCOUNTER — Ambulatory Visit: Payer: BLUE CROSS/BLUE SHIELD | Admitting: Internal Medicine

## 2019-03-18 ENCOUNTER — Telehealth: Payer: Self-pay | Admitting: *Deleted

## 2019-03-18 NOTE — Telephone Encounter (Signed)
Received Prior Authorization Request for Diclofenac Sodium Gel from [facility] Fax: [phone] Member ID: [id number] Filled out and placed in Folder for Dinah to review and sign.

## 2019-03-27 ENCOUNTER — Telehealth: Payer: Self-pay | Admitting: *Deleted

## 2019-03-27 MED ORDER — LEVOMILNACIPRAN HCL ER 40 MG PO CP24: 1.0000 | ORAL_CAPSULE | Freq: Every day | ORAL | 1 refills | Status: DC

## 2019-03-27 NOTE — Telephone Encounter (Signed)
Patient called and stated that she received her AVS from her previous TeleVisit and it was wrong. Stated that the Levomilnacipran HCL Titration pak was discontinued but she is taking it. Has discontinued the Zoloft instead.  Is it ok to add back the Levomilnacipran. She stated that she is taking it and the bupropion. Please Advise.

## 2019-03-27 NOTE — Telephone Encounter (Signed)
She may resume the fetzima 40mg  daily and continue the wellbutrin.  She is not on zoloft anymore.  Sounds like she needs the fetzima refilled.  Not sure what happened on the AVS.

## 2019-03-27 NOTE — Telephone Encounter (Signed)
Spoke with patient and advised results rx sent to pharmacy by e-script  

## 2019-03-27 NOTE — Telephone Encounter (Signed)
.  left message to have patient return my call.  

## 2019-03-27 NOTE — Telephone Encounter (Signed)
This was discontinued on 03/14/2019 on a visit with Dinah, not sure what pt should be taking, this was started on 02/17/19 by Dr. Renato Gails.  4. Depression, major, single episode, moderate (HCC) -d/c zoloft which can cause weight gain and she does not feel it's been effective -cont wellbutrin -add fetzima in place of zoloft-hopefully insurance covers well  - Levomilnacipran HCl ER (FETZIMA TITRATION) 20 & 40 MG C4PK; As directed on titration pack  Dispense: 28 each; Refill: 0

## 2019-03-28 ENCOUNTER — Telehealth: Payer: Self-pay

## 2019-03-28 NOTE — Telephone Encounter (Signed)
Called & spoke w/ pt regarding this request. 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 Pt states she needs a doctor's note from the office stating her condition. Pt last seen 08/05/2018 w/ VS for pulmonary sarcoidosis and COPD.  Since [facility] is not in-office, routing to TN (APP of the day) for f/u.  TN, please advise if it would be ok to write a letter for this pt. Thank you.

## 2019-03-28 NOTE — Telephone Encounter (Signed)
Spoke with patient. She is ok with the letter. She will come by the office to pick up the letter. Will go ahead and type letter and have TN to sign.   Nothing further needed at time of call.

## 2019-03-28 NOTE — Telephone Encounter (Signed)
She can get a note to be out for 1 month. Please make note. Thanks.

## 2019-04-01 ENCOUNTER — Telehealth: Payer: Self-pay | Admitting: *Deleted

## 2019-04-01 NOTE — Telephone Encounter (Signed)
Received Prior Authorization from [employer] for Fetzima.  Initiated Prior Authorization through [employer]. Awaiting Determination.   Key: [id number] Rx #: [id number]

## 2019-05-22 ENCOUNTER — Other Ambulatory Visit: Payer: Self-pay

## 2019-05-22 ENCOUNTER — Other Ambulatory Visit: Payer: BLUE CROSS/BLUE SHIELD | Admitting: *Deleted

## 2019-05-22 DIAGNOSIS — I2510 Atherosclerotic heart disease of native coronary artery without angina pectoris: Secondary | ICD-10-CM

## 2019-05-22 DIAGNOSIS — D860 Sarcoidosis of lung: Secondary | ICD-10-CM

## 2019-05-22 DIAGNOSIS — I10 Essential (primary) hypertension: Secondary | ICD-10-CM

## 2019-05-22 DIAGNOSIS — E782 Mixed hyperlipidemia: Secondary | ICD-10-CM

## 2019-05-22 LAB — TSH: TSH: 2.59 u[IU]/mL (ref 0.450–4.500)

## 2019-06-04 ENCOUNTER — Other Ambulatory Visit: Payer: BLUE CROSS/BLUE SHIELD

## 2019-06-05 ENCOUNTER — Other Ambulatory Visit: Payer: Self-pay | Admitting: Family

## 2019-06-05 ENCOUNTER — Telehealth: Payer: Self-pay | Admitting: *Deleted

## 2019-06-05 NOTE — Telephone Encounter (Signed)
Received Prior Authorization Request from [facility] for patient's Diclofenac  Initiated through [facility]. [place] ID: [id number] BIN: [id number]  Went into determination.

## 2019-06-05 NOTE — Telephone Encounter (Signed)
Last filled 03/14/2019

## 2019-06-06 ENCOUNTER — Other Ambulatory Visit: Payer: BLUE CROSS/BLUE SHIELD

## 2019-06-06 ENCOUNTER — Other Ambulatory Visit: Payer: Self-pay | Admitting: Family

## 2019-06-06 NOTE — Telephone Encounter (Signed)
Refused Yesterday (06/05/2019):  Refill not appropriate (If still having shoulder pain, needs ortho evaluation)

## 2019-06-09 ENCOUNTER — Other Ambulatory Visit: Payer: Self-pay

## 2019-06-09 ENCOUNTER — Other Ambulatory Visit: Payer: Self-pay | Admitting: *Deleted

## 2019-06-09 ENCOUNTER — Other Ambulatory Visit: Payer: BLUE CROSS/BLUE SHIELD | Admitting: *Deleted

## 2019-06-09 DIAGNOSIS — E785 Hyperlipidemia, unspecified: Secondary | ICD-10-CM

## 2019-06-09 DIAGNOSIS — I10 Essential (primary) hypertension: Secondary | ICD-10-CM

## 2019-06-09 MED ORDER — TRAMADOL HCL 50 MG PO TABS: 50.0000 mg | ORAL_TABLET | Freq: Four times a day (QID) | ORAL | 0 refills | Status: DC | PRN

## 2019-06-09 NOTE — Telephone Encounter (Signed)
Pended Rx and sent to Dr. Mariea Clonts for approval.   Please Advise.

## 2019-06-09 NOTE — Telephone Encounter (Addendum)
Refused Yesterday (06/05/2019):  Refill not appropriate (If still having shoulder pain, needs ortho evaluation)     Called pharmacy spoke with Tabitha  and canceled refill. Patient notified.    Patient stated that she is not using the Tramadol for the Shoulder pain but for her arthritis and Carpal Tunnel. Wants to know if she can have refilled. Please Advise.

## 2019-06-09 NOTE — Telephone Encounter (Signed)
Patient requested refill

## 2019-06-09 NOTE — Addendum Note (Signed)
Addended by: Rafael Bihari A on: 06/09/2019 11:30 AM   Modules accepted: Orders

## 2019-06-10 ENCOUNTER — Encounter: Payer: Self-pay | Admitting: Gastroenterology

## 2019-06-10 LAB — LIPID PANEL
Chol/HDL Ratio: 2.8 ratio (ref 0.0–4.4)
Cholesterol, Total: 167 mg/dL (ref 100–199)
HDL: 59 mg/dL (ref >39)
LDL Calculated: 92 mg/dL (ref 0–99)
Triglycerides: 80 mg/dL (ref 0–149)
VLDL Cholesterol Cal: 16 mg/dL (ref 5–40)

## 2019-06-11 NOTE — Telephone Encounter (Signed)
Medication was DENIED for coverage through [facility] due to it does not meet the definition of Medical Necessity.

## 2019-06-20 ENCOUNTER — Telehealth: Payer: Self-pay

## 2019-06-20 DIAGNOSIS — E785 Hyperlipidemia, unspecified: Secondary | ICD-10-CM

## 2019-06-20 DIAGNOSIS — E782 Mixed hyperlipidemia: Secondary | ICD-10-CM

## 2019-06-20 NOTE — Telephone Encounter (Signed)
Reviewed results with patient who verbalized understanding. Per Dr. Harrington Challenger to increase Crestor to 10 mg daily and f/u with lipids in12 weeks6. Lab appt has been made for 10/1 Order has been placed in Epic and med list has been updated.

## 2019-06-20 NOTE — Telephone Encounter (Signed)
-----   Message from Rodman Key, RN sent at 06/20/2019  3:26 PM EDT ----- Will route to CMA pool to contact patient.

## 2019-06-23 ENCOUNTER — Other Ambulatory Visit: Payer: Self-pay | Admitting: *Deleted

## 2019-06-23 MED ORDER — ROSUVASTATIN CALCIUM 10 MG PO TABS: 10.0000 mg | ORAL_TABLET | Freq: Every day | ORAL | 3 refills | Status: DC

## 2019-07-08 ENCOUNTER — Telehealth: Payer: Self-pay

## 2019-07-08 NOTE — Telephone Encounter (Signed)
No show for PV. Left message to call and reschedule PV before 5:00 Pm today. If patient does not reschedule, a no show letter will be mailed.   Riki Sheer, LPN ( PV )

## 2019-07-22 ENCOUNTER — Encounter: Payer: BLUE CROSS/BLUE SHIELD | Admitting: Gastroenterology

## 2019-08-15 ENCOUNTER — Other Ambulatory Visit: Payer: BLUE CROSS/BLUE SHIELD

## 2019-08-19 ENCOUNTER — Telehealth: Payer: Self-pay | Admitting: Internal Medicine

## 2019-08-19 NOTE — Telephone Encounter (Signed)
Opened in error °Lisa M °

## 2019-08-27 ENCOUNTER — Encounter: Payer: Self-pay | Admitting: Pulmonary Disease

## 2019-08-27 ENCOUNTER — Other Ambulatory Visit: Payer: Self-pay

## 2019-08-27 ENCOUNTER — Ambulatory Visit (INDEPENDENT_AMBULATORY_CARE_PROVIDER_SITE_OTHER): Payer: BLUE CROSS/BLUE SHIELD | Admitting: Pulmonary Disease

## 2019-08-27 DIAGNOSIS — J449 Chronic obstructive pulmonary disease, unspecified: Secondary | ICD-10-CM

## 2019-08-27 DIAGNOSIS — D860 Sarcoidosis of lung: Secondary | ICD-10-CM

## 2019-08-27 NOTE — Progress Notes (Signed)
[facility]  Chief Complaint  Patient presents with  . Sarcoidosis    Constitutional:  There were no vitals taken for this visit.  Deferred  Past Medical History:    Brief Summary:  Kaitlyn [name] is a 53 y.o. female former smoker with pulmonary sarcoidosis, and COPD.  Virtual Visit via Telephone Note  I connected with [name] on 08/27/19 at  9:00 AM EDT by telephone and verified that I am speaking with the correct person using two identifiers.  Location: Patient: Mother's home Provider: medical office   I discussed the limitations, risks, security and privacy concerns of performing an evaluation and management service by telephone and the availability of in person appointments. I also discussed with the patient that there may be a patient responsible charge related to this service. The patient expressed understanding and agreed to proceed.  She is currently in [place].  Plans to come back later today.  Isn't working at present - has been out of work since start of COVID pandemic.    She has gained weight.  She is getting more short of breath.  Has cough with clear sputum.  Remains on MTX, bactrim, folic acid, dulera, singulair.  No recent LFTs, or CBC.  She is hopeful her insurance will have better coverage to get PFT CT chest.  Physical Exam:  Deferred.   Assessment/Plan:   Pulmonary sarcoidosis. - will need to schedule in office visit for further assessment - will need to arrange for PFT, HRCT chest, lab testing - will then determine plan for MTX - will need to assess when she would be able to get flu shot  COPD with chronic bronchitis. - continue dulera, singulair, prn albuterol   Patient Instructions  Will schedule in office appointment in one week after you return to [facility]    I discussed the assessment and treatment plan with the patient. The patient was provided an opportunity to ask questions and all  were answered. The patient agreed with the plan and demonstrated an understanding of the instructions.   The patient was advised to call back or seek an in-person evaluation if the symptoms worsen or if the condition fails to improve as anticipated.  I provided 6 minutes of non-face-to-face time during this encounter.   [name], MD [facility] Pager: [phone] 08/27/2019, 9:21 AM  Flow Sheet     Pulmonary tests:  EBUS with Tbx 03/01/12>>Streptococcus pneumoniae in BAL, cytology/Tbx negative 04/30/12 start prednisone PFT 03/13/12>>FEV1 1.65 (57%), FEV1% 58, TLC 4.44 (79%), DLCO 62%, no BD PFT 03/18/13 >> FEV1 1.78 (69%), FEV1% 67, TLC 4.04 (75%), DLCO 92%, no BD PFT 01/13/14 >> FEV1 1.64 (70%), FEV1% 70, TLC 3.69 (68%), DLCO 79%  Sleep tests:  CT chest 01/23/12 >> bulky mediastinal/hilar LAN. Scattered ill-defined upper lobe predominate perilymphatic and centrilobular nodules. CT chest 04/30/12 >> No change in the appearance of the mediastinal and hilar adenopathy and pulmonary nodularity CT chest 07/31/12 >> b/l hilar adenopathy no change, b/l upper lobe subpleural nodularity, improved LUL GGO CT chest 04/14/13 >> peribronchovascular nodularity b/l upper lobes Rt > Lt mildly progressed, no change to mediastinal/hilar adenopathy CT chest 01/05/14 >>upper lobe predominant prebronchovascular reticulo-nodule opacities, 1.2 cm Rt paratracheal LAN, 1.6 cm subcarinal LAN, 1.7 cm Rt hilar LAN  CT chest 10/07/14 >> decreased LAN CT chest 04/26/16 >>atherosclerosis, borderline LAN, patchy nodularity b/l   Events:  04/30/13 Start MTX  Medications:   Allergies as of 08/27/2019  Reactions   Codeine Other (See Comments)   Reaction unknown   Cyclinex [tetracycline Hcl] Other (See Comments)   Reaction unknown, all "cyclines"   Milk Thistle Hives   Morphine And Related Other (See Comments)   Reaction unknown      Medication List       Accurate as of August 27, 2019  9:21 AM. If you have any questions, ask your nurse or doctor.        STOP taking these medications   phentermine 37.5 MG capsule Stopped by: Coralyn HellingVineet Yasmin Dibello, MD     TAKE these medications   acetaminophen 500 MG tablet Commonly known as: TYLENOL Take 500 mg by mouth every 6 (six) hours as needed. For pain   albuterol 108 (90 Base) MCG/ACT inhaler Commonly known as: VENTOLIN HFA Inhale 2 puffs into the lungs every 6 (six) hours as needed for wheezing or shortness of breath.   aspirin EC 81 MG tablet Take 1 tablet (81 mg total) by mouth daily.   buPROPion 150 MG 12 hr tablet Commonly known as: WELLBUTRIN SR Take 1 tablet (150 mg total) by mouth 2 (two) times daily.   calcium carbonate 500 MG chewable tablet Commonly known as: TUMS - dosed in mg elemental calcium Chew 1 tablet by mouth daily as needed. For indigestion   cephALEXin 500 MG capsule Commonly known as: KEFLEX Take one (1) capsule (500 mg) by mouth twice daily four (4) times per week.   clindamycin 1 % lotion Commonly known as: CLEOCIN T Apply 1 application topically 2 (two) times daily as needed (affected areas).   diclofenac sodium 1 % Gel Commonly known as: VOLTAREN APPLY 2 GRAMS TOPICALLY TO KNEES FOUR TIMES DAILY   folic acid 1 MG tablet Commonly known as: FOLVITE Take 1 tablet (1 mg total) by mouth daily.   GLUCOSAMINE HCL PO Take 1 tablet by mouth 2 (two) times daily.   ipratropium-albuterol 0.5-2.5 (3) MG/3ML Soln Commonly known as: DUONEB Take 3 mLs by nebulization every 4 (four) hours as needed (shortness of breath).   Levomilnacipran HCl ER 40 MG Cp24 Commonly known as: Fetzima Take 1 tablet by mouth daily.   losartan-hydrochlorothiazide 100-12.5 MG tablet Commonly known as: HYZAAR Take 1 tablet by mouth daily.   methotrexate 2.5 MG tablet Commonly known as: RHEUMATREX TAKE 4 TABLETS BY MOUTH WEEKLY( CAUTION CHEMOTHERAPY PROTECT FROM LIGHT)   mometasone-formoterol 200-5 MCG/ACT  Aero Commonly known as: Dulera Inhale 2 puffs into the lungs 2 (two) times daily.   montelukast 10 MG tablet Commonly known as: SINGULAIR TAKE 1 TABLET(10 MG) BY MOUTH AT BEDTIME   omega-3 fish oil 1000 MG Caps capsule Commonly known as: MAXEPA Take by mouth.   rosuvastatin 10 MG tablet Commonly known as: CRESTOR Take 1 tablet (10 mg total) by mouth daily.   sulfamethoxazole-trimethoprim 400-80 MG tablet Commonly known as: BACTRIM TAKE 1 TABLET BY MOUTH THREE TIMES A WEEK   traMADol 50 MG tablet Commonly known as: ULTRAM Take 1 tablet (50 mg total) by mouth every 6 (six) hours as needed for moderate pain.   TURMERIC PO Take 538 mg by mouth 2 (two) times a day.   vitamin B-12 100 MCG tablet Commonly known as: CYANOCOBALAMIN Take 200 mcg by mouth daily. 1000 x2   VITAMIN D PO Take 2 tablets by mouth once a week.       Past Surgical History:  She  has a past surgical history that includes Other surgical history (2010); Multiple tooth  extractions (2010); REMOVAL OF GROWTH FROM FINGER; Video bronchoscopy with endobronchial ultrasound (03/01/2012); Open reduction nasal fracture (06/29/2009); and Nasal turbinate reduction (Bilateral, 06/29/2009).  Family History:  Her family history includes Arthritis in her mother; Cancer in her father and maternal aunt; Depression in her mother; Diabetes in her maternal aunt; Heart disease in her father; Hyperlipidemia in her mother; Hypertension in her father; Stroke in her mother.  Social History:  She  reports that she quit smoking about 7 years ago. Her smoking use included cigarettes and e-cigarettes. She has a 35.00 pack-year smoking history. She has never used smokeless tobacco. She reports current alcohol use. She reports that she does not use drugs.

## 2019-08-27 NOTE — Patient Instructions (Signed)
Will schedule in office appointment in one week after you return to [facility]

## 2019-09-01 ENCOUNTER — Ambulatory Visit: Payer: BLUE CROSS/BLUE SHIELD | Admitting: Pulmonary Disease

## 2019-09-04 ENCOUNTER — Encounter: Payer: Self-pay | Admitting: General Surgery

## 2019-09-04 ENCOUNTER — Telehealth: Payer: Self-pay | Admitting: General Surgery

## 2019-09-04 NOTE — Telephone Encounter (Signed)
Dr. Halford Chessman, this patient had a telephone visit with you on 08/27/19. And scheduled to follow up with you on 09/08/19 for in office visit.  A faxed prescription request was received for Methotrexate 2.5 mg. Patient to take 4 tablets weekly with a dispense of 48 tablets.  Based on her telephone visit, can a refill be issued and sent to [facility]? Please advise, thank you.

## 2019-09-04 NOTE — Telephone Encounter (Signed)
Called [facility] and asked them to put their request for refill on hold as the patient has to be seen in office first for evaluation before any refill can be issued. Let them know the patient has not been seen in office in over a year. Nothing further needed at this time.

## 2019-09-04 NOTE — Telephone Encounter (Signed)
Need to have in office appointment first before deciding to refill methotrexate.

## 2019-09-08 ENCOUNTER — Other Ambulatory Visit (INDEPENDENT_AMBULATORY_CARE_PROVIDER_SITE_OTHER): Payer: BLUE CROSS/BLUE SHIELD

## 2019-09-08 ENCOUNTER — Other Ambulatory Visit: Payer: Self-pay

## 2019-09-08 ENCOUNTER — Encounter: Payer: Self-pay | Admitting: Pulmonary Disease

## 2019-09-08 ENCOUNTER — Ambulatory Visit (INDEPENDENT_AMBULATORY_CARE_PROVIDER_SITE_OTHER): Payer: BLUE CROSS/BLUE SHIELD | Admitting: Pulmonary Disease

## 2019-09-08 VITALS — BP 130/70 | HR 79 | Temp 98.5°F | Ht 67.0 in | Wt 310.6 lb

## 2019-09-08 DIAGNOSIS — J449 Chronic obstructive pulmonary disease, unspecified: Secondary | ICD-10-CM

## 2019-09-08 DIAGNOSIS — D869 Sarcoidosis, unspecified: Secondary | ICD-10-CM

## 2019-09-08 DIAGNOSIS — Z23 Encounter for immunization: Secondary | ICD-10-CM | POA: Diagnosis not present

## 2019-09-08 DIAGNOSIS — Z789 Other specified health status: Secondary | ICD-10-CM

## 2019-09-08 DIAGNOSIS — Z6841 Body Mass Index (BMI) 40.0 and over, adult: Secondary | ICD-10-CM

## 2019-09-08 DIAGNOSIS — R0600 Dyspnea, unspecified: Secondary | ICD-10-CM

## 2019-09-08 DIAGNOSIS — R0609 Other forms of dyspnea: Secondary | ICD-10-CM

## 2019-09-08 LAB — COMPREHENSIVE METABOLIC PANEL
ALT: 13 U/L (ref 0–35)
AST: 16 U/L (ref 0–37)
Albumin: 3.6 g/dL (ref 3.5–5.2)
Alkaline Phosphatase: 115 U/L (ref 39–117)
BUN: 11 mg/dL (ref 6–23)
CO2: 27 mEq/L (ref 19–32)
Calcium: 9.2 mg/dL (ref 8.4–10.5)
Chloride: 104 mEq/L (ref 96–112)
Creatinine, Ser: 1.02 mg/dL (ref 0.40–1.20)
GFR: 68.21 mL/min (ref >60.00)
Glucose, Bld: 104 mg/dL — ABNORMAL HIGH (ref 70–99)
Potassium: 3.8 mEq/L (ref 3.5–5.1)
Sodium: 138 mEq/L (ref 135–145)
Total Bilirubin: 0.4 mg/dL (ref 0.2–1.2)
Total Protein: 7.7 g/dL (ref 6.0–8.3)

## 2019-09-08 LAB — CBC WITH DIFFERENTIAL/PLATELET
Basophils Absolute: 0.1 10*3/uL (ref 0.0–0.1)
Basophils Relative: 0.6 % (ref 0.0–3.0)
Eosinophils Absolute: 0.2 10*3/uL (ref 0.0–0.7)
Eosinophils Relative: 1.7 % (ref 0.0–5.0)
HCT: 40.1 % (ref 36.0–46.0)
Hemoglobin: 13.4 g/dL (ref 12.0–15.0)
Lymphocytes Relative: 14.3 % (ref 12.0–46.0)
Lymphs Abs: 1.6 10*3/uL (ref 0.7–4.0)
MCHC: 33.4 g/dL (ref 30.0–36.0)
MCV: 96.2 fl (ref 78.0–100.0)
Monocytes Absolute: 0.9 10*3/uL (ref 0.1–1.0)
Monocytes Relative: 8 % (ref 3.0–12.0)
Neutro Abs: 8.6 10*3/uL — ABNORMAL HIGH (ref 1.4–7.7)
Neutrophils Relative %: 75.4 % (ref 43.0–77.0)
Platelets: 260 10*3/uL (ref 150.0–400.0)
RBC: 4.17 Mil/uL (ref 3.87–5.11)
RDW: 14.6 % (ref 11.5–15.5)
WBC: 11.4 10*3/uL — ABNORMAL HIGH (ref 4.0–10.5)

## 2019-09-08 NOTE — Progress Notes (Signed)
[facility]  Chief Complaint  Patient presents with  . Pulmonary Sarcoidosis    Constitutional:  BP 130/70 (BP Location: Right Arm, Patient Position: Sitting, Cuff Size: Normal) Comment (BP Location): forearm  Pulse 79   Temp 98.5 F (36.9 C)   Ht 5\' 7"  (1.702 m)   Wt (!) 310 lb 9.6 oz (140.9 kg)   SpO2 98% Comment: on room air  BMI 48.65 kg/m   Past Medical History:  OA, Keloid, HTN, HLD, Hidradenitis, Depression, Carpal tunnel, Anxiety  Brief Summary:  Kaitlyn [name] is a 53 y.o. feMichael Littermale former smoker with pulmonary sarcoidosis, and COPD.  She returned from [place].  Her cough and dyspnea are improved some.  She thinks she had a cold she got from her boyfriend.  Not having wheeze, sputum, fever, hemoptysis, skin rash, or leg swelling.  Gets winded while walking, especially when she has to wear a mask.  She is using E cigarettes, but has not been smoking tobacco cigarettes.  Has noticed occasional bruising more commonly.  Physical Exam:   Appearance - well kempt   ENMT - no sinus tenderness, no nasal discharge, no oral exudate  Neck - no masses, trachea midline, no thyromegaly, no elevation in JVP  Respiratory - normal appearance of chest wall, normal respiratory effort w/o accessory muscle use, no dullness on percussion, no wheezing or rales  CV - s1s2 regular rate and rhythm, no murmurs, no peripheral edema, radial pulses symmetric  GI - soft, non tender  Lymph - no adenopathy noted in neck and axillary areas  MSK - normal gait  Ext - no cyanosis, clubbing, or joint inflammation noted  Skin - no rashes, lesions, or ulcers  Neuro - normal strength, oriented x 3  Psych - normal mood and affect    Assessment/Plan:   Pulmonary sarcoidosis. - will check CBC, CMET, HRCT and then determine plans for MTX - continue prophylaxis with bactrim while on MTX - will schedule f/u PFT also - flu shot today - she will need  pneumovax booster at next in office visit  COPD with chronic bronchitis. - continue dulera, singulair, prn albuterol continue dulera, singulair, prn albuterol   Patient Instructions  Will schedule lab tests, high resolution CT chest, and pulmonary function test  Will schedule tele visit in 2 weeks to review test results      Coralyn HellingVineet Carsyn Boster, MD [facility] Pager: [phone] 09/08/2019, 3:30 PM  Flow Sheet     Pulmonary tests:  EBUS with Tbx 03/01/12>>Streptococcus pneumoniae in BAL, cytology/Tbx negative 04/30/12 start prednisone PFT 03/13/12>>FEV1 1.65 (57%), FEV1% 58, TLC 4.44 (79%), DLCO 62%, no BD PFT 03/18/13 >> FEV1 1.78 (69%), FEV1% 67, TLC 4.04 (75%), DLCO 92%, no BD PFT 01/13/14 >> FEV1 1.64 (70%), FEV1% 70, TLC 3.69 (68%), DLCO 79%  Sleep tests:  CT chest 01/23/12 >> bulky mediastinal/hilar LAN. Scattered ill-defined upper lobe predominate perilymphatic and centrilobular nodules. CT chest 04/30/12 >> No change in the appearance of the mediastinal and hilar adenopathy and pulmonary nodularity CT chest 07/31/12 >> b/l hilar adenopathy no change, b/l upper lobe subpleural nodularity, improved LUL GGO CT chest 04/14/13 >> peribronchovascular nodularity b/l upper lobes Rt > Lt mildly progressed, no change to mediastinal/hilar adenopathy CT chest 01/05/14 >>upper lobe predominant prebronchovascular reticulo-nodule opacities, 1.2 cm Rt paratracheal LAN, 1.6 cm subcarinal LAN, 1.7 cm Rt hilar LAN  CT chest 10/07/14 >> decreased LAN CT chest 04/26/16 >>atherosclerosis, borderline LAN, patchy nodularity b/l  Events:  04/30/13 Start MTX  Medications:   Allergies as of 09/08/2019      Reactions   Codeine Other (See Comments)   Reaction unknown   Cyclinex [tetracycline Hcl] Other (See Comments)   Reaction unknown, all "cyclines"   Milk Thistle Hives   Morphine And Related Other (See Comments)   Reaction unknown      Medication List       Accurate as  of September 08, 2019  3:30 PM. If you have any questions, ask your nurse or doctor.        acetaminophen 500 MG tablet Commonly known as: TYLENOL Take 500 mg by mouth every 6 (six) hours as needed. For pain   albuterol 108 (90 Base) MCG/ACT inhaler Commonly known as: VENTOLIN HFA Inhale 2 puffs into the lungs every 6 (six) hours as needed for wheezing or shortness of breath.   aspirin EC 81 MG tablet Take 1 tablet (81 mg total) by mouth daily.   buPROPion 150 MG 12 hr tablet Commonly known as: WELLBUTRIN SR Take 1 tablet (150 mg total) by mouth 2 (two) times daily.   calcium carbonate 500 MG chewable tablet Commonly known as: TUMS - dosed in mg elemental calcium Chew 1 tablet by mouth daily as needed. For indigestion   cephALEXin 500 MG capsule Commonly known as: KEFLEX Take one (1) capsule (500 mg) by mouth twice daily four (4) times per week.   clindamycin 1 % lotion Commonly known as: CLEOCIN T Apply 1 application topically 2 (two) times daily as needed (affected areas).   diclofenac sodium 1 % Gel Commonly known as: VOLTAREN APPLY 2 GRAMS TOPICALLY TO KNEES FOUR TIMES DAILY   folic acid 1 MG tablet Commonly known as: FOLVITE Take 1 tablet (1 mg total) by mouth daily.   GLUCOSAMINE HCL PO Take 1 tablet by mouth 2 (two) times daily.   ipratropium-albuterol 0.5-2.5 (3) MG/3ML Soln Commonly known as: DUONEB Take 3 mLs by nebulization every 4 (four) hours as needed (shortness of breath).   Levomilnacipran HCl ER 40 MG Cp24 Commonly known as: Fetzima Take 1 tablet by mouth daily.   losartan-hydrochlorothiazide 100-12.5 MG tablet Commonly known as: HYZAAR Take 1 tablet by mouth daily.   methotrexate 2.5 MG tablet Commonly known as: RHEUMATREX TAKE 4 TABLETS BY MOUTH WEEKLY( CAUTION CHEMOTHERAPY PROTECT FROM LIGHT)   mometasone-formoterol 200-5 MCG/ACT Aero Commonly known as: Dulera Inhale 2 puffs into the lungs 2 (two) times daily.   montelukast 10 MG tablet  Commonly known as: SINGULAIR TAKE 1 TABLET(10 MG) BY MOUTH AT BEDTIME   omega-3 fish oil 1000 MG Caps capsule Commonly known as: MAXEPA Take by mouth.   rosuvastatin 10 MG tablet Commonly known as: CRESTOR Take 1 tablet (10 mg total) by mouth daily.   sulfamethoxazole-trimethoprim 400-80 MG tablet Commonly known as: BACTRIM TAKE 1 TABLET BY MOUTH THREE TIMES A WEEK   traMADol 50 MG tablet Commonly known as: ULTRAM Take 1 tablet (50 mg total) by mouth every 6 (six) hours as needed for moderate pain.   TURMERIC PO Take 538 mg by mouth 2 (two) times a day.   vitamin B-12 100 MCG tablet Commonly known as: CYANOCOBALAMIN Take 200 mcg by mouth daily. 1000 x2   VITAMIN D PO Take 2 tablets by mouth once a week.       Past Surgical History:  She  has a past surgical history that includes Other surgical history (2010); Multiple tooth extractions (2010); REMOVAL OF GROWTH FROM  FINGER; Video bronchoscopy with endobronchial ultrasound (03/01/2012); Open reduction nasal fracture (06/29/2009); and Nasal turbinate reduction (Bilateral, 06/29/2009).  Family History:  Her family history includes Arthritis in her mother; Cancer in her father and maternal aunt; Depression in her mother; Diabetes in her maternal aunt; Heart disease in her father; Hyperlipidemia in her mother; Hypertension in her father; Stroke in her mother.  Social History:  She  reports that she quit smoking about 7 years ago. Her smoking use included cigarettes and e-cigarettes. She has a 35.00 pack-year smoking history. She has never used smokeless tobacco. She reports current alcohol use. She reports that she does not use drugs.

## 2019-09-08 NOTE — Patient Instructions (Signed)
Will schedule lab tests, high resolution CT chest, and pulmonary function test  Will schedule tele visit in 2 weeks to review test results

## 2019-09-09 ENCOUNTER — Other Ambulatory Visit: Payer: Self-pay | Admitting: *Deleted

## 2019-09-09 MED ORDER — TRAMADOL HCL 50 MG PO TABS: 50.0000 mg | ORAL_TABLET | Freq: Four times a day (QID) | ORAL | 0 refills | Status: DC | PRN

## 2019-09-09 NOTE — Telephone Encounter (Signed)
Received refill Request [facility] Pended Rx and sent to Dr. Mariea Clonts for approval Cannot check [place] will not let me log in.

## 2019-09-12 ENCOUNTER — Telehealth: Payer: Self-pay | Admitting: Pulmonary Disease

## 2019-09-12 MED ORDER — SULFAMETHOXAZOLE-TRIMETHOPRIM 400-80 MG PO TABS: ORAL_TABLET | ORAL | 1 refills | Status: DC

## 2019-09-12 MED ORDER — FOLIC ACID 1 MG PO TABS: 1.0000 mg | ORAL_TABLET | Freq: Every day | ORAL | 5 refills | Status: DC

## 2019-09-12 MED ORDER — METHOTREXATE 2.5 MG PO TABS: ORAL_TABLET | ORAL | 1 refills | Status: DC

## 2019-09-12 NOTE — Telephone Encounter (Signed)
Called and spoke with patient.  Dr. Juanetta Gosling results and recommendations given.  Understanding stated.  Nothing further at this time.

## 2019-09-12 NOTE — Telephone Encounter (Signed)
CMP Latest Ref Rng & Units 09/08/2019 06/03/2018 01/31/2018  Glucose 70 - 99 mg/dL 104(H) 94 -  BUN 6 - 23 mg/dL 11 13 8   Creatinine 0.40 - 1.20 mg/dL 1.02 1.12(H) 1.1  Sodium 135 - 145 mEq/L 138 138 146  Potassium 3.5 - 5.1 mEq/L 3.8 3.9 5.2  Chloride 96 - 112 mEq/L 104 102 -  CO2 19 - 32 mEq/L 27 29 -  Calcium 8.4 - 10.5 mg/dL 9.2 9.1 -  Total Protein 6.0 - 8.3 g/dL 7.7 - -  Total Bilirubin 0.2 - 1.2 mg/dL 0.4 - -  Alkaline Phos 39 - 117 U/L 115 - 126(A)  AST 0 - 37 U/L 16 - 25  ALT 0 - 35 U/L 13 - -    CBC Latest Ref Rng & Units 09/08/2019 01/31/2018 09/24/2017  WBC 4.0 - 10.5 K/uL 11.4(H) 8.3 8.4  Hemoglobin 12.0 - 15.0 g/dL 13.4 12.6 13.5  Hematocrit 36.0 - 46.0 % 40.1 38 40.9  Platelets 150.0 - 400.0 K/uL 260.0 234 236.0    Please let her know lab tests were normal.    I have sent refills for methotrexate, bactrim, and folic acid.

## 2019-09-18 ENCOUNTER — Inpatient Hospital Stay: Admission: RE | Admit: 2019-09-18 | Payer: BLUE CROSS/BLUE SHIELD | Source: Ambulatory Visit

## 2019-10-01 ENCOUNTER — Inpatient Hospital Stay: Admission: RE | Admit: 2019-10-01 | Payer: BLUE CROSS/BLUE SHIELD | Source: Ambulatory Visit

## 2019-10-02 ENCOUNTER — Ambulatory Visit: Payer: BLUE CROSS/BLUE SHIELD | Admitting: Pulmonary Disease

## 2019-11-04 ENCOUNTER — Inpatient Hospital Stay: Admission: RE | Admit: 2019-11-04 | Payer: BLUE CROSS/BLUE SHIELD | Source: Ambulatory Visit

## 2019-11-10 ENCOUNTER — Telehealth: Payer: Self-pay | Admitting: *Deleted

## 2019-11-10 MED ORDER — FETZIMA 40 MG PO CP24: 1.0000 | ORAL_CAPSULE | Freq: Every day | ORAL | 1 refills | Status: DC

## 2019-11-10 NOTE — Telephone Encounter (Signed)
Ok to fill? Last filled 03/27/19, I'm sorry I'm not familiar with this medication.

## 2019-11-11 ENCOUNTER — Other Ambulatory Visit: Payer: Self-pay | Admitting: Internal Medicine

## 2019-11-11 DIAGNOSIS — F321 Major depressive disorder, single episode, moderate: Secondary | ICD-10-CM

## 2019-11-11 NOTE — Telephone Encounter (Signed)
See phone note sent

## 2019-11-11 NOTE — Telephone Encounter (Signed)
Med list updated

## 2019-11-11 NOTE — Addendum Note (Signed)
Addended by: Sueanne Margarita on: 11/11/2019 11:55 AM   Modules accepted: Orders

## 2019-11-11 NOTE — Telephone Encounter (Signed)
Ok, we'll change her back to zoloft

## 2019-11-11 NOTE — Telephone Encounter (Signed)
Pt calling in asking to be changed back to Zoloft, pt states the Fetzima is $80.00 and the Zoloft is $20.00, please advise

## 2019-12-16 ENCOUNTER — Inpatient Hospital Stay: Admission: RE | Admit: 2019-12-16 | Payer: BLUE CROSS/BLUE SHIELD | Source: Ambulatory Visit

## 2019-12-16 ENCOUNTER — Other Ambulatory Visit: Payer: Self-pay

## 2019-12-16 ENCOUNTER — Ambulatory Visit (INDEPENDENT_AMBULATORY_CARE_PROVIDER_SITE_OTHER)
Admission: RE | Admit: 2019-12-16 | Discharge: 2019-12-16 | Disposition: A | Discharge status: Home / Self Care | Payer: BLUE CROSS/BLUE SHIELD | Source: Ambulatory Visit | Attending: Pulmonary Disease | Admitting: Pulmonary Disease

## 2019-12-16 DIAGNOSIS — D869 Sarcoidosis, unspecified: Secondary | ICD-10-CM

## 2019-12-20 ENCOUNTER — Other Ambulatory Visit: Payer: Self-pay | Admitting: Internal Medicine

## 2019-12-20 DIAGNOSIS — F321 Major depressive disorder, single episode, moderate: Secondary | ICD-10-CM

## 2019-12-22 ENCOUNTER — Encounter: Payer: Self-pay | Admitting: Primary Care

## 2019-12-22 ENCOUNTER — Telehealth (INDEPENDENT_AMBULATORY_CARE_PROVIDER_SITE_OTHER): Payer: BLUE CROSS/BLUE SHIELD | Admitting: Primary Care

## 2019-12-22 VITALS — Wt 321.0 lb

## 2019-12-22 DIAGNOSIS — Z6841 Body Mass Index (BMI) 40.0 and over, adult: Secondary | ICD-10-CM

## 2019-12-22 DIAGNOSIS — J449 Chronic obstructive pulmonary disease, unspecified: Secondary | ICD-10-CM | POA: Diagnosis not present

## 2019-12-22 DIAGNOSIS — D860 Sarcoidosis of lung: Secondary | ICD-10-CM | POA: Diagnosis not present

## 2019-12-22 NOTE — Patient Instructions (Signed)
  Pulmonary sarcoidosis: - Stable, no progression on CT imaging  - Continue Methotrexate and bactrim   COPD: - Continue Dulera 200 two puffs twice daily and Albuterol every 6 hours as needed - Needs updated Pulmonary function testing  - If no improvement in dyspnea with weight loss/regular exervise consider further cardiac work up    BMI 50: - Encourage weight loss and regular physical exercise - Refer healthy weight and wellness

## 2019-12-22 NOTE — Telephone Encounter (Signed)
rx sent to pharmacy by e-script  

## 2019-12-22 NOTE — Progress Notes (Signed)
Reviewed and agree with assessment/plan.   Alaysiah Browder, MD [facility] 10/25/2016, 12:24 PM Pager:  [phone]  

## 2019-12-22 NOTE — Progress Notes (Signed)
Virtual Visit via Video Note  I connected with Kaitlyn [name] on 12/22/19 at 11:30 AM EST by a video enabled telemedicine application and verified that I am speaking with the correct person using two identifiers.  Location: Patient: Home Provider: Office   I discussed the limitations of evaluation and management by telemedicine and the availability of in person appointments. The patient expressed understanding and agreed to proceed.  History of Present Illness: 54 year old female, former smoker quit 2013 (35 pack year hx). PMH significant for COPD with chronic bronchitis, pulmonary sarcoidosis. Patient of Dr. Halford Chessman, last seen on 09/08/19. Started on MTX in 2014. Continue prophylaxis bactrim. Lab tests were normal. Refills sent for methotrexate, bactim and folic acid. HRCT in February 2020 showed chronic fibrotic pulmonary sarcoidosis which has improved since 2017. Received influenza vaccine at last visit, needs pneumovax booster. Maintained on Dulera, Singulair and prn albuterol.   12/22/2019 Patient contacted today for follow-up visit. Changed from Dr. Halford Chessman afternoon schedule to televisit today d/t schedule conflict. States that she has been more shortness of breath with mild exertion. States that she hasn't been taking all her medications as she should. She has been caring for her mother. She has been compliant with Dulera twice daily and using her nebulizer twice daily which helps. She has put on weight and been less active since covid. Today weight is 321, which is up 11 lbs since this fall. Recent HRCT this month showed chronic fibrotic pulmonary sarcoidosis which has improved since 2017.    Observations/Objective:  Appears well No witness shortness of breath, wheezing or cough    Significant testing reviewed:  Bronchoscopy: EBUS with Tbx 03/01/12>>Streptococcus pneumoniae in BAL, cytology/Tbx negative 04/30/12 start prednisone  Pulmonary function testing: PFT 03/13/12>>FEV1 1.65  (57%), FEV1% 58, TLC 4.44 (79%), DLCO 62%, no BD PFT 03/18/13 >> FEV1 1.78 (69%), FEV1% 67, TLC 4.04 (75%), DLCO 92%, no BD PFT 01/13/14 >> FEV1 1.64 (70%), FEV1% 70, TLC 3.69 (68%), DLCO 79%  Imaging: CT chest 01/23/12 >> bulky mediastinal/hilar LAN. Scattered ill-defined upper lobe predominate perilymphatic and centrilobular nodules. CT chest 04/30/12 >> No change in the appearance of the mediastinal and hilar adenopathy and pulmonary nodularity CT chest 07/31/12 >> b/l hilar adenopathy no change, b/l upper lobe subpleural nodularity, improved LUL GGO CT chest 04/14/13 >> peribronchovascular nodularity b/l upper lobes Rt > Lt mildly progressed, no change to mediastinal/hilar adenopathy CT chest 01/05/14 >>upper lobe predominant prebronchovascular reticulo-nodule opacities, 1.2 cm Rt paratracheal LAN, 1.6 cm subcarinal LAN, 1.7 cm Rt hilar LAN  CT chest 10/07/14 >> decreased LAN CT chest 04/26/16 >>atherosclerosis, borderline LAN, patchy nodularity b/l  HRCT 12/16/19- Spectrum of pulmonary parenchymal findings compatible with chronic fibrotic pulmonary sarcoidosis as evidenced by central bronchiectasis. Findings are overall improved since 2017 CT, with no progressive disease. Mild air trapping in the upper lobes, as can be due to small airways disease from sarcoidosis. Previously visualized mild mediastinal bilateral hilar lymphadenopathy has decreased. One vessel coronary atherosclerosis.   Assessment and Plan:  Pulmonary sarcoidosis: - Stable, no progression on CT imaging  - Continue MXT and bactrim   COPD: - PFT in 2015 FEV1 2.34 (74%), ratio 70 - Increased dyspnea with activity; no signs of exacerbation  - Continue Dulera 200 two puffs twice daily and prn Albuterol hfa/neb q 6 hours - Needs updated PFTs, consider adding LAMA  - If no improvement in dyspnea with weight loss/regular exervise consider further cardiac work up    BMI 50: - Contributing to  dyspnea  - Refer healthy weight and  wellness   Follow Up Instructions:  - FU in 3 months; if no improvement with weight loss/regular exercise consider further cardiac work up for dyspnea  I discussed the assessment and treatment plan with the patient. The patient was provided an opportunity to ask questions and all were answered. The patient agreed with the plan and demonstrated an understanding of the instructions.   The patient was advised to call back or seek an in-person evaluation if the symptoms worsen or if the condition fails to improve as anticipated.  I provided 22 minutes of non-face-to-face time during this encounter.   Glenford Bayley, NP

## 2019-12-23 MED ORDER — DULERA 200-5 MCG/ACT IN AERO: 2.0000 | INHALATION_SPRAY | Freq: Two times a day (BID) | RESPIRATORY_TRACT | 5 refills | Status: DC

## 2019-12-23 MED ORDER — ALBUTEROL SULFATE HFA 108 (90 BASE) MCG/ACT IN AERS: 2.0000 | INHALATION_SPRAY | Freq: Four times a day (QID) | RESPIRATORY_TRACT | 5 refills | Status: DC | PRN

## 2019-12-23 MED ORDER — IPRATROPIUM-ALBUTEROL 0.5-2.5 (3) MG/3ML IN SOLN: 3.0000 mL | RESPIRATORY_TRACT | 5 refills | Status: DC | PRN

## 2019-12-23 NOTE — Addendum Note (Signed)
Addended by: Boone Master E on: 12/23/2019 01:20 PM   Modules accepted: Orders

## 2020-01-02 ENCOUNTER — Telehealth: Payer: Self-pay | Admitting: Pulmonary Disease

## 2020-01-02 NOTE — Telephone Encounter (Signed)
Spoke with patient. She stated that she just needed a letter with our letterhead to state her diagnosis. She needs the letter for work. I advised her that I would have the letter ready for pickup on Monday since our office is currently closed. She verbalized understanding.   Nothing further needed at time of call.

## 2020-02-25 ENCOUNTER — Other Ambulatory Visit: Payer: Self-pay | Admitting: Family

## 2020-02-25 ENCOUNTER — Other Ambulatory Visit: Payer: Self-pay | Admitting: Internal Medicine

## 2020-02-25 DIAGNOSIS — F321 Major depressive disorder, single episode, moderate: Secondary | ICD-10-CM

## 2020-02-26 NOTE — Telephone Encounter (Signed)
Patient need appointment last appointment was in 10/20.

## 2020-03-04 ENCOUNTER — Encounter: Payer: Self-pay | Admitting: Internal Medicine

## 2020-03-04 ENCOUNTER — Ambulatory Visit (INDEPENDENT_AMBULATORY_CARE_PROVIDER_SITE_OTHER): Payer: BLUE CROSS/BLUE SHIELD | Admitting: Internal Medicine

## 2020-03-04 ENCOUNTER — Other Ambulatory Visit: Payer: Self-pay

## 2020-03-04 VITALS — BP 122/82 | HR 67 | Temp 97.8°F | Ht 67.0 in | Wt 313.0 lb

## 2020-03-04 DIAGNOSIS — J449 Chronic obstructive pulmonary disease, unspecified: Secondary | ICD-10-CM | POA: Diagnosis not present

## 2020-03-04 DIAGNOSIS — R635 Abnormal weight gain: Secondary | ICD-10-CM

## 2020-03-04 DIAGNOSIS — J4489 Other specified chronic obstructive pulmonary disease: Secondary | ICD-10-CM

## 2020-03-04 DIAGNOSIS — Z6841 Body Mass Index (BMI) 40.0 and over, adult: Secondary | ICD-10-CM

## 2020-03-04 DIAGNOSIS — E7800 Pure hypercholesterolemia, unspecified: Secondary | ICD-10-CM

## 2020-03-04 DIAGNOSIS — F321 Major depressive disorder, single episode, moderate: Secondary | ICD-10-CM | POA: Diagnosis not present

## 2020-03-04 DIAGNOSIS — J339 Nasal polyp, unspecified: Secondary | ICD-10-CM

## 2020-03-04 DIAGNOSIS — I10 Essential (primary) hypertension: Secondary | ICD-10-CM

## 2020-03-04 MED ORDER — BUPROPION HCL ER (SR) 150 MG PO TB12: 150.0000 mg | ORAL_TABLET | Freq: Two times a day (BID) | ORAL | 5 refills | Status: DC

## 2020-03-04 MED ORDER — SERTRALINE HCL 25 MG PO TABS: ORAL_TABLET | ORAL | 0 refills | Status: DC

## 2020-03-04 NOTE — Progress Notes (Addendum)
Location:  [facility] clinic Provider: Korah Hufstedler L. Renato Gails, D.O., C.M.D.  Code Status: full code Goals of Care:  Advanced Directives 03/04/2020  Does Patient Have a Medical Advance Directive? No  Would patient like information on creating a medical advance directive? Yes (ED - Information included in AVS)  Pre-existing out of facility DNR order (yellow form or pink MOST form) -     Chief Complaint  Patient presents with  . Acute Visit    medication refills     HPI: Patient is a 54 y.o. female seen today for an acute visit for  Has a little bitty polyp in her left nostril for which she's going to go to ENT.    She's been on her zoloft and wellbutrin.  Mood has been kind of blah.  Says she is lethargic and blah.    Admits she was 325 lbs and is now down to 313 lbs.  She has bought a bunch of stuff to cook but has not done it.  She has been eating tomatoes, cucumbers, broccoli, pizza things.  Not exercising.  Her breathing makes it challenging.  Does pursed lip breathing when she cannot breathe.  Reminded her to use nebulizers.    She does have her albuterol rescue inhaler.    She is using the blu e-cigs.  Her brother continues to smoke cigarettes in the house.    6/28, has her gyn exam and due for her mammogram.  Past Medical History:  Diagnosis Date  . Anxiety   . Asthma    inhalers   . Carpal tunnel syndrome 01/2015   right  . 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 Depression   . Extrinsic asthma, unspecified   . Hidradenitis   . Hyperlipidemia    takes pravachol  . Hypertension   . Keloid scar   . Obesity   . Osteoarthritis   . Osteoarthrosis, unspecified whether generalized or localized, unspecified site   . PONV (postoperative nausea and vomiting)   . Pulmonary sarcoidosis (HCC) 02/22/2012   PFT 03/13/12>>FEV1 1.65 (57%), FEV1% 58, TLC 4.44 (79%), DLCO 62%, no BD EBUS with Tbx  03/01/12>>Streptococcus pneumoniae in BAL, cytology/Tbx negative Likely sarcoidosis>>start prednisone 04/30/12   . Sarcoidosis   . Shortness of breath    exertion  . Wears dentures    top    Past Surgical History:  Procedure Laterality Date  . MULTIPLE TOOTH EXTRACTIONS  2010  . NASAL TURBINATE REDUCTION Bilateral 06/29/2009   inferior  . OPEN REDUCTION NASAL FRACTURE  06/29/2009   with exc. bx. left intranasal papilloma  . OTHER SURGICAL HISTORY  2010   cyst removal from right middle finger    . REMOVAL OF GROWTH FROM FINGER     DR Metro Kung  . VIDEO BRONCHOSCOPY WITH ENDOBRONCHIAL ULTRASOUND  03/01/2012    Allergies  Allergen Reactions  . Codeine Other (See Comments)    Reaction unknown  . Cyclinex [Tetracycline Hcl] Other (See Comments)    Reaction unknown, all "cyclines"  . Milk Thistle Hives  . Morphine And Related Other (See Comments)    Reaction unknown    Outpatient Encounter Medications as of 03/04/2020  Medication Sig  . acetaminophen (TYLENOL) 500 MG tablet Take 500 mg by mouth every 6 (six) hours as needed. For pain  . albuterol (VENTOLIN HFA) 108 (90 Base) MCG/ACT inhaler Inhale 2 puffs into the lungs every 6 (six)  hours as needed for wheezing or shortness of breath.  Marland Kitchen aspirin EC 81 MG tablet Take 1 tablet (81 mg total) by mouth daily.  Marland Kitchen buPROPion (WELLBUTRIN SR) 150 MG 12 hr tablet Take 1 tablet (150 mg total) by mouth 2 (two) times daily.  . calcium carbonate (TUMS - DOSED IN MG ELEMENTAL CALCIUM) 500 MG chewable tablet Chew 1 tablet by mouth daily as needed. For indigestion  . cephALEXin (KEFLEX) 500 MG capsule Take one (1) capsule (500 mg) by mouth twice daily four (4) times per week.  . Cholecalciferol (VITAMIN D PO) Take 2 tablets by mouth once a week.   . clindamycin (CLEOCIN T) 1 % lotion Apply 1 application topically 2 (two) times daily as needed (affected areas).  . diclofenac sodium (VOLTAREN) 1 % GEL APPLY 2 GRAMS TOPICALLY TO KNEES FOUR TIMES DAILY    . folic acid (FOLVITE) 1 MG tablet Take 1 tablet (1 mg total) by mouth daily.  Marland Kitchen GLUCOSAMINE HCL PO Take 1 tablet by mouth 2 (two) times daily.  Marland Kitchen ipratropium-albuterol (DUONEB) 0.5-2.5 (3) MG/3ML SOLN Take 3 mLs by nebulization every 4 (four) hours as needed (shortness of breath  (D86.0, J44.9)).  Marland Kitchen losartan-hydrochlorothiazide (HYZAAR) 100-12.5 MG tablet TAKE 1 TABLET BY MOUTH DAILY  . methotrexate (RHEUMATREX) 2.5 MG tablet TAKE 4 TABLETS BY MOUTH WEEKLY( CAUTION CHEMOTHERAPY PROTECT FROM LIGHT)  . mometasone-formoterol (DULERA) 200-5 MCG/ACT AERO Inhale 2 puffs into the lungs 2 (two) times daily.  . montelukast (SINGULAIR) 10 MG tablet TAKE 1 TABLET(10 MG) BY MOUTH AT BEDTIME  . omega-3 fish oil (MAXEPA) 1000 MG CAPS capsule Take by mouth.  . rosuvastatin (CRESTOR) 10 MG tablet Take 1 tablet (10 mg total) by mouth daily.  . sertraline (ZOLOFT) 25 MG tablet TAKE 1 TABLET(25 MG) BY MOUTH DAILY  . sulfamethoxazole-trimethoprim (BACTRIM) 400-80 MG tablet TAKE 1 TABLET BY MOUTH THREE TIMES A WEEK  . traMADol (ULTRAM) 50 MG tablet Take 1 tablet (50 mg total) by mouth every 6 (six) hours as needed for moderate pain.  . TURMERIC PO Take 538 mg by mouth 2 (two) times a day.   . vitamin B-12 (CYANOCOBALAMIN) 100 MCG tablet Take 200 mcg by mouth daily. 1000 x2  . [DISCONTINUED] buPROPion (WELLBUTRIN SR) 150 MG 12 hr tablet Take 1 tablet (150 mg total) by mouth 2 (two) times daily.  . [DISCONTINUED] sertraline (ZOLOFT) 25 MG tablet TAKE 1 TABLET(25 MG) BY MOUTH DAILY   No facility-administered encounter medications on file as of 03/04/2020.    Review of Systems:  Review of Systems  Constitutional: Positive for malaise/fatigue. Negative for chills and fever.  HENT: Negative for hearing loss.        Left nasal polyps  Eyes: Negative for blurred vision.  Respiratory: Positive for cough, sputum production, shortness of breath and wheezing.   Cardiovascular: Negative for chest pain, palpitations and  leg swelling.  Gastrointestinal: Negative for abdominal pain, constipation and diarrhea.  Genitourinary: Negative for dysuria.  Musculoskeletal: Positive for joint pain and myalgias. Negative for falls.  Skin: Negative for rash.  Neurological: Negative for dizziness and loss of consciousness.  Endo/Heme/Allergies: Does not bruise/bleed easily.  Psychiatric/Behavioral: Positive for depression. Negative for memory loss. The patient is nervous/anxious. The patient does not have insomnia.     Health Maintenance  Topic Date Due  . HIV Screening  Never done  . PAP SMEAR-Modifier  Never done  . COLONOSCOPY  Never done  . INFLUENZA VACCINE  05/30/2020  . MAMMOGRAM  11/29/2020  . TETANUS/TDAP  12/29/2026  . COVID-19 Vaccine  Completed    Physical Exam: Vitals:   03/04/20 0840  Weight: (!) 313 lb (142 kg)  Height: 5\' 7"  (1.702 m)   Body mass index is 49.02 kg/m. Physical Exam Constitutional:      General: She is not in acute distress.    Appearance: Normal appearance. She is obese. She is not toxic-appearing.  HENT:     Nose:     Comments: Polyps in left nostril Cardiovascular:     Rate and Rhythm: Normal rate and regular rhythm.     Pulses: Normal pulses.     Heart sounds: Normal heart sounds.  Pulmonary:     Effort: Pulmonary effort is normal.     Breath sounds: Normal breath sounds. No wheezing, rhonchi or rales.  Musculoskeletal:        General: Normal range of motion.     Right lower leg: No edema.     Left lower leg: No edema.  Skin:    General: Skin is warm and dry.  Neurological:     General: No focal deficit present.     Mental Status: She is alert and oriented to person, place, and time.  Psychiatric:        Mood and Affect: Mood normal.     Labs reviewed: Basic Metabolic Panel: Recent Labs    05/22/19 0741 09/08/19 1603  NA  --  138  K  --  3.8  CL  --  104  CO2  --  27  GLUCOSE  --  104*  BUN  --  11  CREATININE  --  1.02  CALCIUM  --  9.2  TSH  2.590  --    Liver Function Tests: Recent Labs    09/08/19 1603  AST 16  ALT 13  ALKPHOS 115  BILITOT 0.4  PROT 7.7  ALBUMIN 3.6   No results for input(s): LIPASE, AMYLASE in the last 8760 hours. No results for input(s): AMMONIA in the last 8760 hours. CBC: Recent Labs    09/08/19 1603  WBC 11.4*  NEUTROABS 8.6*  HGB 13.4  HCT 40.1  MCV 96.2  PLT 260.0   Lipid Panel: Recent Labs    06/09/19 1417  CHOL 167  HDL 59  LDLCALC 92  TRIG 80  CHOLHDL 2.8   Lab Results  Component Value Date   HGBA1C 5.4 12/28/2016    Assessment/Plan 1. Left nasal polyps -she's going to ENT about this for removal  2. Depression, major, single episode, moderate (HCC) -cont wellbutrin and zoloft - sertraline (ZOLOFT) 25 MG tablet; TAKE 1 TABLET(25 MG) BY MOUTH DAILY  Dispense: 30 tablet; Refill: 0  3. COPD with chronic bronchitis (HCC) -cont singulair, prn albuterol, prn nebs, dulera  4. Morbid obesity due to excess calories (HCC) -has gained quite a bit of weight again lately -will refer to [facility] -counseled on diet and exercise regimen but she says her breathing interferes  - CBC with Differential/Platelet; Future - COMPLETE METABOLIC PANEL WITH GFR; Future - Hemoglobin A1c; Future - Lipid panel; Future - TSH; Future  5. BMI 45.0-49.9, adult (HCC) - as in #4, will refer to [facility] - CBC with Differential/Platelet; Future - COMPLETE METABOLIC PANEL WITH GFR; Future - Hemoglobin A1c; Future - Lipid panel; Future - TSH; Future  6. Benign essential HTN -bp at goal, cont current regimen  7. Pure hypercholesterolemia - did not get labs ordered  by Dr. Harrington Challenger last year--will order with next labs for June before CPE with me - Hemoglobin A1c; Future - Lipid panel; Future  8. Weight gain - poor eating habits and lack of activity - Hemoglobin A1c; Future - Lipid panel; Future  Labs/tests ordered:   Lab Orders     CBC with  Differential/Platelet     COMPLETE METABOLIC PANEL WITH GFR     Hemoglobin A1c     Lipid panel     TSH  Next appt:  To call for CPE next month with fasting labs before   Triva Hueber L. Ladarryl Wrage, D.O. [facility] [address], [place] [address] Cell Phone (Mon-Fri 8am-5pm):  [phone] On Call:  [phone] & follow prompts after 5pm & weekends Office Phone:  [phone] Office Fax:  [phone]

## 2020-03-28 ENCOUNTER — Other Ambulatory Visit: Payer: Self-pay | Admitting: Internal Medicine

## 2020-03-28 DIAGNOSIS — F321 Major depressive disorder, single episode, moderate: Secondary | ICD-10-CM

## 2020-04-29 NOTE — Progress Notes (Deleted)
Cardiology Office Note   Date:  04/29/2020   ID:  Kaitlyn [name], DOB 03-26-1966, MRN [id number]  PCP:  Kermit Balo, DO  Cardiologist:   Dietrich Pates, MD   [name] for edema, abnorma chest CT    History of Present Illness: Kaitlyn [name] is a 54 y.o. female with a history of morbid obesity, sarcoidosis, HL, brhonchitis, dperession, OA, tobacco use and HTN   Followed by Alvina Filbert.  Seen in August   Complaints of swelling in legs and SOB   Pursed lip breathing   Does not use inhalers due to cost and no insurance.  Eats salt and fatty food She denies CP    CT of chest with plaquing of coronary arteries       Pt does admit to vaping  She was started on lasix 40 in august     I last saw the pt in APril 2020 as a televisit  No outpatient medications have been marked as taking for the 04/30/20 encounter (Appointment) with [name], MD.     Allergies:   Codeine, Cyclinex [tetracycline hcl], Milk thistle, and Morphine and related   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   PFT 03/13/12>>FEV1 1.65 (57%), FEV1% 58, TLC 4.44 (79%), DLCO 62%, no BD    Depression    Extrinsic asthma, unspecified    Hidradenitis    Hyperlipidemia    takes pravachol   Hypertension    Keloid scar    Obesity    Osteoarthritis    Osteoarthrosis, unspecified whether generalized or localized, unspecified site    PONV (postoperative nausea and vomiting)    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 bx. left intranasal papilloma   OTHER SURGICAL HISTORY  2010   cyst removal from right middle finger     REMOVAL OF GROWTH FROM FINGER     DR MEYERDIERKS   VIDEO BRONCHOSCOPY WITH ENDOBRONCHIAL ULTRASOUND  03/01/2012     Social History:  The patient  reports that she quit smoking about 8 years ago. Her smoking use included cigarettes and e-cigarettes. She has a 35.00 pack-year smoking history. She has never used smokeless tobacco. She reports current alcohol use. She reports that she does not use drugs.   Family History:  The patient's family history includes Arthritis in her mother; Cancer in her father and maternal aunt; Depression in her mother; Diabetes in her maternal aunt; Heart disease in her father; Hyperlipidemia in her mother; Hypertension in her father; Stroke in her mother.    ROS:  Please see the history of present illness. All other systems are reviewed and  Negative to the above problem except as noted.    PHYSICAL EXAM: VS:  There were no vitals taken for this visit.  GEN: Morbidly obese 54  yo in no acute distress  HEENT: normal  Neck: no JVD, carotid bruits, or masses Cardiac: RRR; no murmurs, rubs, or gallops  Tr edema  Respiratory: Wheezes GI: soft, nontender, nondistended, + BS  No hepatomegaly  MS: no deformity Moving all extremities   Skin: warm and dry, no rash Neuro:  Strength and sensation are intact Psych: euthymic mood, full affect   EKG:  EKG is ordered today. SR 73bpm   PACs   LVH  Anteorseptal MI     Lipid Panel    Component Value Date/Time   CHOL 167 06/09/2019 1417   TRIG 80 06/09/2019 1417   HDL 59 06/09/2019 1417   CHOLHDL 2.8 06/09/2019 1417   CHOLHDL 3.0 12/28/2016 1013   VLDL 19 12/28/2016 1013   LDLCALC 92 06/09/2019 1417      Wt Readings from Last 3 Encounters:  03/04/20 (!) 313 lb (142 kg)  12/22/19 (!)  321 lb (145.6 kg)  09/08/19 (!) 310 lb 9.6 oz (140.9 kg)      ASSESSMENT AND PLAN:  1  CAD   Pt with atherosclerosis on CT scan in 2017 (aorta and coronary arteries)She has signif reason for SOB with lung problems   I am not convinced of active angina I would recomm ASA 81 mg daily   She should also be on a statin  2   Edema   Pt admits to eating salty foods   Discussed Na restriction, fluid restriction   She does have tr edema    Can use lasix as needed  3   Pulm   Pt with signif pulm probl  COntinues to vape   Discussed quitting    4   Dyslipidemia  Pt on pravastatin   Needs tighter control with atherosclerosis   I would switch to lipitor 40    Will need f/u lpids  5   Substance abuse   Discussed the importance of quitting vaping     6   Morbid obesity   Disucssed wt loss, how it would improve symptoms.    Would like to see her in Jan / Feb 2020   Current medicines are reviewed at length with the patient today.  The patient does not have concerns regarding medicines.  Signed, Dietrich Pates, MD  04/29/2020 9:38 PM    [facility] [address] [place], [place], [place]  [address] Phone: [phone]; Fax: [phone]

## 2020-04-30 ENCOUNTER — Ambulatory Visit: Payer: BLUE CROSS/BLUE SHIELD | Admitting: Internal Medicine

## 2020-05-29 ENCOUNTER — Other Ambulatory Visit: Payer: Self-pay | Admitting: Internal Medicine

## 2020-05-29 ENCOUNTER — Other Ambulatory Visit: Payer: Self-pay | Admitting: Family

## 2020-05-29 DIAGNOSIS — D860 Sarcoidosis of lung: Secondary | ICD-10-CM

## 2020-05-29 DIAGNOSIS — J449 Chronic obstructive pulmonary disease, unspecified: Secondary | ICD-10-CM

## 2020-05-31 NOTE — Telephone Encounter (Signed)
rx sent to pharmacy by e-script  

## 2020-06-08 ENCOUNTER — Telehealth: Payer: Self-pay | Admitting: Pulmonary Disease

## 2020-06-08 MED ORDER — METHOTREXATE 2.5 MG PO TABS: ORAL_TABLET | ORAL | 1 refills | Status: DC

## 2020-06-08 NOTE — Telephone Encounter (Signed)
I have sent refill of methotrexate to preferred pharmacy for pt. Attempted to call pt to let her know this had been done but unable to reach. Left message for her to return call.

## 2020-06-09 NOTE — Telephone Encounter (Signed)
Pt aware that medication has been sent to pharmacy and to keep her appt for October. ta

## 2020-06-09 NOTE — Telephone Encounter (Signed)
LMTCB x2  

## 2020-07-09 ENCOUNTER — Telehealth: Payer: Self-pay

## 2020-07-26 ENCOUNTER — Other Ambulatory Visit: Payer: Self-pay

## 2020-07-26 ENCOUNTER — Encounter: Payer: BLUE CROSS/BLUE SHIELD | Admitting: Internal Medicine

## 2020-07-26 ENCOUNTER — Encounter: Payer: Self-pay | Admitting: Internal Medicine

## 2020-07-26 ENCOUNTER — Telehealth: Payer: Self-pay

## 2020-07-26 NOTE — Telephone Encounter (Signed)
Kaitlyn [name], Kaitlyn [name] are scheduled for a virtual visit with your provider today.    Just as we do with appointments in the office, we must obtain your consent to participate.  Your consent will be active for this visit and any virtual visit you may have with one of our providers in the next 365 days.    If you have a MyChart account, I can also send a copy of this consent to you electronically.  All virtual visits are billed to your insurance company just like a traditional visit in the office.  As this is a virtual visit, video technology does not allow for your provider to perform a traditional examination.  This may limit your provider's ability to fully assess your condition.  If your provider identifies any concerns that need to be evaluated in person or the need to arrange testing such as labs, EKG, etc, we will make arrangements to do so.    Although advances in technology are sophisticated, we cannot ensure that it will always work on either your end or our end.  If the connection with a video visit is poor, we may have to switch to a telephone visit.  With either a video or telephone visit, we are not always able to ensure that we have a secure connection.   I need to obtain your verbal consent now.   Are you willing to proceed with your visit today?   Kaitlyn [name] has provided verbal consent on 07/26/2020 for a virtual visit (video or telephone).   Karlene Lineman, [facility] 07/26/2020  9:37 AM

## 2020-07-26 NOTE — Progress Notes (Signed)
Pt contacted 3 times via text to join mychart video visit.  No response.  Appt will need to be rescheduled.  This encounter was created in error - please disregard.

## 2020-08-03 ENCOUNTER — Ambulatory Visit: Payer: BLUE CROSS/BLUE SHIELD | Admitting: Pulmonary Disease

## 2020-08-26 ENCOUNTER — Encounter: Payer: Self-pay | Admitting: Internal Medicine

## 2020-08-26 ENCOUNTER — Telehealth (INDEPENDENT_AMBULATORY_CARE_PROVIDER_SITE_OTHER): Payer: BLUE CROSS/BLUE SHIELD | Admitting: Internal Medicine

## 2020-08-26 ENCOUNTER — Other Ambulatory Visit: Payer: Self-pay

## 2020-08-26 DIAGNOSIS — F17210 Nicotine dependence, cigarettes, uncomplicated: Secondary | ICD-10-CM

## 2020-08-26 DIAGNOSIS — J449 Chronic obstructive pulmonary disease, unspecified: Secondary | ICD-10-CM | POA: Diagnosis not present

## 2020-08-26 DIAGNOSIS — J4489 Other specified chronic obstructive pulmonary disease: Secondary | ICD-10-CM

## 2020-08-26 DIAGNOSIS — F3341 Major depressive disorder, recurrent, in partial remission: Secondary | ICD-10-CM

## 2020-08-26 DIAGNOSIS — E782 Mixed hyperlipidemia: Secondary | ICD-10-CM

## 2020-08-26 DIAGNOSIS — I10 Essential (primary) hypertension: Secondary | ICD-10-CM | POA: Diagnosis not present

## 2020-08-26 DIAGNOSIS — D860 Sarcoidosis of lung: Secondary | ICD-10-CM | POA: Diagnosis not present

## 2020-08-26 NOTE — Progress Notes (Signed)
Virtual Visit via Video Note  I connected with Kaitlyn [name] on 08/26/20 at  9:00 AM EDT by a video enabled telemedicine application and verified that I am speaking with the correct person using two identifiers.  Location: Patient: Home Provider: [facility]   I discussed the limitations of evaluation and management by telemedicine and the availability of in person appointments. The patient expressed understanding and agreed to proceed.  History of Present Illness: 54 yo female being seen via video visit for 6 month follow-up and medical mgt of her chronic diseases.  She is leaving to go out of town at 9:30 and had appt scheduled this am at 9am.  Text sent to join call at 8:51 after CMA spoke with her.  She's with her boyfriend in [place].  Taking care of boyfriend with cataract surgery.  Doing pretty well.  Her weight remains a concern.  She's not having an other issues.  Breathing better than usiual.  Doing the pursed lip breathing helps.  No whesome wheezing.  She's had a few cigarettes--uses as a bad coping mechanism.  She also uses the blue e cig.  She's planning to put it down one day.  Has not returned to work so not exercising.  Dr. Craige Cotta had her out of work due to covid risk and risk of bad outcome with sarcoid.     Mood has been fairly good.  Boyfriend is trigger and then they argue and forget what they argued.    Has not been taking her bp medication b/c it causes urinary frequency.  If takes at night, then has to get up to go.   She's coming back here Friday and can come do labs then.  She still has not gone for a colonoscopy.  Canceled her appts for that and mammo and pap smear.  Has info but needs to call and do those.   Plans to get her flu shot with Dr. Craige Cotta.    Observations/Objective: Weight has gone up--though actually says she has had a weight of 309 recently.   Last bp was good maybe 140/50stg.    Assessment and Plan: 1. Benign essential HTN -bp mildly elevated per pt at home  last time she took but has been noncompliant with her medication--encouraged adherence  2. COPD with chronic bronchitis -followed by pulmonary -back to smoking cigarettes, still has coughing and wheezing -keep current regimen  3. Pulmonary sarcoidosis -out of work due to this and covid risks  4. Recurrent major depressive disorder, in partial remission (HCC) -cont current regimen, doing better with her mood  5. Morbid obesity due to excess calories (HCC) -ongoing, has not gained more weight since last recorded in sept, but remains inactive with poor diet -encouraged mobility and improved eating habits  6. Mixed hyperlipidemia -f/u labs next week as ordered a long time ago to be done in office before she was seen  7. Tobacco dependence due to cigarettes -discouraged smoking with her ongoing lung disease -says boyfriend is also discouraging her from smoking -keep pulmonary f/u   Follow Up Instructions:  Come next week for labs previously ordered   I discussed the assessment and treatment plan with the patient. The patient was provided an opportunity to ask questions and all were answered. The patient agreed with the plan and demonstrated an understanding of the instructions.   The patient was advised to call back or seek an in-person evaluation if the symptoms worsen or if the condition fails to improve as anticipated.  I provided 20 minutes of non-face-to-face time during this encounter.  Kaitlyn [name], D.O. Geriatrics [facility] [address], [place] [address] Cell Phone (Mon-Fri 8am-5pm):  [phone] On Call:  [phone] & follow prompts after 5pm & weekends Office Phone:  [phone] Office Fax:  [phone]

## 2020-08-31 ENCOUNTER — Other Ambulatory Visit: Payer: BLUE CROSS/BLUE SHIELD

## 2020-08-31 DIAGNOSIS — Z6841 Body Mass Index (BMI) 40.0 and over, adult: Secondary | ICD-10-CM

## 2020-08-31 DIAGNOSIS — E7800 Pure hypercholesterolemia, unspecified: Secondary | ICD-10-CM

## 2020-08-31 DIAGNOSIS — R635 Abnormal weight gain: Secondary | ICD-10-CM

## 2020-09-27 ENCOUNTER — Ambulatory Visit: Payer: BLUE CROSS/BLUE SHIELD | Admitting: Pulmonary Disease

## 2020-10-01 NOTE — Telephone Encounter (Signed)
error 

## 2020-11-05 ENCOUNTER — Other Ambulatory Visit: Payer: Self-pay | Admitting: Internal Medicine

## 2020-11-05 DIAGNOSIS — F321 Major depressive disorder, single episode, moderate: Secondary | ICD-10-CM

## 2020-11-09 ENCOUNTER — Ambulatory Visit (INDEPENDENT_AMBULATORY_CARE_PROVIDER_SITE_OTHER): Payer: Self-pay | Admitting: Pulmonary Disease

## 2020-11-09 ENCOUNTER — Encounter: Payer: Self-pay | Admitting: Pulmonary Disease

## 2020-11-09 ENCOUNTER — Other Ambulatory Visit: Payer: Self-pay

## 2020-11-09 VITALS — BP 138/78 | HR 58 | Temp 97.3°F | Ht 67.0 in | Wt 317.0 lb

## 2020-11-09 DIAGNOSIS — D860 Sarcoidosis of lung: Secondary | ICD-10-CM

## 2020-11-09 NOTE — Patient Instructions (Signed)
Change methotrexate to 3 pills weekly for 2 weeks, then 2 pills weekly for 2 weeks, then 1 pill weekly for 2 weeks, and then stop methotrexate  Lab tests today  Follow up in 10 weeks

## 2020-11-09 NOTE — Progress Notes (Signed)
[facility]  Chief Complaint  Patient presents with  . Follow-up    SOB at times    Constitutional:  BP 138/78 (BP Location: Right Arm, Cuff Size: Large)   Pulse (!) 58   Temp (!) 97.3 F (36.3 C)   Ht 5\' 7"  (1.702 m)   Wt (!) 317 lb (143.8 kg)   SpO2 97%   BMI 49.65 kg/m   Past Medical History:  OA, Keloid, HTN, HLD, Hidradenitis, Depression, Carpal tunnel, Anxiety  Past Surgical History:  She  has a past surgical history that includes Other surgical history (2010); Multiple tooth extractions (2010); REMOVAL OF GROWTH FROM FINGER; Video bronchoscopy with endobronchial ultrasound (03/01/2012); Open reduction nasal fracture (06/29/2009); and Nasal turbinate reduction (Bilateral, 06/29/2009).  Brief Summary:  Kaitlyn [name] is a 55 y.o. female smoker with pulmonary sarcoidosis, and COPD.      Subjective:   She has been vaping.  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  Hasn't been able to keep up with her medications consistently.  Takes methotrexate one or two weeks per month.  She has cough with clear sputum.  Not having fever, wheeze, chest pain, or leg swelling.    Physical Exam:   Appearance - well kempt   ENMT - no sinus tenderness, no oral exudate, no LAN, Mallampati 3 airway, no stridor, wears dentures  Respiratory - equal breath sounds bilaterally, no wheezing or rales  CV - s1s2 regular rate and rhythm, no murmurs  Ext - no clubbing, no edema  Skin - no rashes  Psych - normal mood and affect  Pulmonary testing:   EBUS with Tbx 03/01/12 >> Streptococcus pneumoniae in BAL, cytology/Tbx negative  PFT 03/13/12>>FEV1 1.65 (57%), FEV1% 58, TLC 4.44 (79%), DLCO 62%, no BD  PFT 03/18/13 >> FEV1 1.78 (69%), FEV1% 67, TLC 4.04 (75%), DLCO 92%, no BD  PFT 01/13/14 >> FEV1 1.64 (70%), FEV1% 70, TLC 3.69 (68%), DLCO 79%  Chest Imaging:   CT chest 01/23/12 >> bulky mediastinal/hilar LAN. Scattered  ill-defined upper lobe predominate perilymphatic and centrilobular nodules.  CT chest 04/30/12 >> No change in the appearance of the mediastinal and hilar adenopathy and pulmonary nodularity  CT chest 07/31/12 >> b/l hilar adenopathy no change, b/l upper lobe subpleural nodularity, improved LUL GGO  CT chest 04/14/13 >> peribronchovascular nodularity b/l upper lobes Rt > Lt mildly progressed, no change to mediastinal/hilar adenopathy  CT chest 01/05/14 >>upper lobe predominant prebronchovascular reticulo-nodule opacities, 1.2 cm Rt paratracheal LAN, 1.6 cm subcarinal LAN, 1.7 cm Rt hilar LAN   CT chest 10/07/14 >> decreased LAN  CT chest 04/26/16 >>atherosclerosis, borderline LAN, patchy nodularity b/l   HRCT chest 12/16/19 >> atherosclerosis, mild patchy air trapping b/l, diffuse irregular thickening of interstitium with mild/mod central BTX and architectural distortion w/o change   Social History:  She  reports that she has been smoking cigarettes and e-cigarettes. She has a 8.75 pack-year smoking history. She has never used smokeless tobacco. She reports current alcohol use. She reports that she does not use drugs.  Family History:  Her family history includes Arthritis in her mother; Cancer in her father and maternal aunt; Depression in her mother; Diabetes in her maternal aunt; Heart disease in her father; Hyperlipidemia in her mother; Hypertension in her father; Stroke in her mother.     Assessment/Plan:   Pulmonary sarcoidosis. - has been on methotrexate since 2014 - most recent CT chest imaging shows stable changes  of chronic sarcoid - repeat lab tests today - will start to gradually taper off methotrexate as tolerated: 3 pills weekly for 2 weeks, then 2 pills weekly for 2 weeks, then 1 pill weekly for 2 weeks, and then stop methotrexate - defer repeat PFT and CT chest imaging until she is off methotrexate  COPD with chronic bronchitis. - she might need to switch from dulera  to other ICS/LABA if she isn't able to get dulera anymore - continue singulair - prn albuterol  Hidradenitis suppurativa. - advised her to d/w her dermatologist if she needs adjustment to her regimen as she is tapered off methotrexate  Obesity. - discussed importance of weight loss  Vaping. - discussed how continued vaping can contribute to lung disease and advised her to quit  COVID 19 advice. - advised that she could return to work with precautions  Time Spent Involved in Patient Care on Day of Examination:  32 minutes  Follow up:  Patient Instructions  Change methotrexate to 3 pills weekly for 2 weeks, then 2 pills weekly for 2 weeks, then 1 pill weekly for 2 weeks, and then stop methotrexate  Lab tests today  Follow up in 10 weeks   Medication List:   Allergies as of 11/09/2020      Reactions   Codeine Other (See Comments)   Reaction unknown   Cyclinex [tetracycline Hcl] Other (See Comments)   Reaction unknown, all "cyclines"   Milk Thistle Hives   Morphine And Related Other (See Comments)   Reaction unknown      Medication List       Accurate as of November 09, 2020 12:46 PM. If you have any questions, ask your nurse or doctor.        acetaminophen 500 MG tablet Commonly known as: TYLENOL Take 500 mg by mouth every 6 (six) hours as needed. For pain   albuterol 108 (90 Base) MCG/ACT inhaler Commonly known as: VENTOLIN HFA Inhale 2 puffs into the lungs every 6 (six) hours as needed for wheezing or shortness of breath.   aspirin EC 81 MG tablet Take 1 tablet (81 mg total) by mouth daily.   buPROPion 150 MG 12 hr tablet Commonly known as: WELLBUTRIN SR Take 1 tablet (150 mg total) by mouth 2 (two) times daily.   cephALEXin 500 MG capsule Commonly known as: KEFLEX Take one (1) capsule (500 mg) by mouth twice daily four (4) times per week.   clindamycin 1 % lotion Commonly known as: CLEOCIN T Apply 1 application topically 2 (two) times daily as needed  (affected areas).   diclofenac Sodium 1 % Gel Commonly known as: VOLTAREN Apply 4 g topically as needed.   Dulera 200-5 MCG/ACT Aero Generic drug: mometasone-formoterol Inhale 2 puffs into the lungs 2 (two) times daily.   folic acid 1 MG tablet Commonly known as: FOLVITE Take 1 mg by mouth daily. Except Sundays.   GLUCOSAMINE HCL PO Take 1 tablet by mouth 2 (two) times daily.   ipratropium-albuterol 0.5-2.5 (3) MG/3ML Soln Commonly known as: DUONEB Take 3 mLs by nebulization every 4 (four) hours as needed (shortness of breath  (D86.0, J44.9)).   losartan-hydrochlorothiazide 100-12.5 MG tablet Commonly known as: HYZAAR TAKE 1 TABLET BY MOUTH DAILY   methotrexate 2.5 MG tablet Commonly known as: RHEUMATREX TAKE 4 TABLETS BY MOUTH WEEKLY( CAUTION CHEMOTHERAPY PROTECT FROM LIGHT)   montelukast 10 MG tablet Commonly known as: SINGULAIR TAKE 1 TABLET(10 MG) BY MOUTH AT BEDTIME   omega-3 fish oil  1000 MG Caps capsule Commonly known as: MAXEPA Take by mouth.   rosuvastatin 10 MG tablet Commonly known as: CRESTOR Take 1 tablet (10 mg total) by mouth daily.   sertraline 25 MG tablet Commonly known as: ZOLOFT TAKE 1 TABLET(25 MG) BY MOUTH DAILY   sulfamethoxazole-trimethoprim 400-80 MG tablet Commonly known as: BACTRIM TAKE 1 TABLET BY MOUTH THREE TIMES A WEEK   traMADol 50 MG tablet Commonly known as: ULTRAM Take 1 tablet (50 mg total) by mouth every 6 (six) hours as needed for moderate pain.   TURMERIC PO Take 538 mg by mouth 2 (two) times a day.   vitamin B-12 100 MCG tablet Commonly known as: CYANOCOBALAMIN Take 200 mcg by mouth daily. 1000 x2   VITAMIN D PO Take 2 tablets by mouth once a week.       Signature:  [name], MD [facility] Pager - [phone] 11/09/2020, 12:46 PM

## 2020-11-30 ENCOUNTER — Other Ambulatory Visit (INDEPENDENT_AMBULATORY_CARE_PROVIDER_SITE_OTHER): Payer: BLUE CROSS/BLUE SHIELD

## 2020-11-30 DIAGNOSIS — D860 Sarcoidosis of lung: Secondary | ICD-10-CM | POA: Diagnosis not present

## 2020-11-30 LAB — CBC WITH DIFFERENTIAL/PLATELET
Basophils Absolute: 0.1 10*3/uL (ref 0.0–0.1)
Basophils Relative: 0.6 % (ref 0.0–3.0)
Eosinophils Absolute: 0.1 10*3/uL (ref 0.0–0.7)
Eosinophils Relative: 1.2 % (ref 0.0–5.0)
HCT: 38.1 % (ref 36.0–46.0)
Hemoglobin: 12.6 g/dL (ref 12.0–15.0)
Lymphocytes Relative: 15.3 % (ref 12.0–46.0)
Lymphs Abs: 1.4 10*3/uL (ref 0.7–4.0)
MCHC: 32.9 g/dL (ref 30.0–36.0)
MCV: 92 fl (ref 78.0–100.0)
Monocytes Absolute: 0.5 10*3/uL (ref 0.1–1.0)
Monocytes Relative: 5.2 % (ref 3.0–12.0)
Neutro Abs: 7.2 10*3/uL (ref 1.4–7.7)
Neutrophils Relative %: 77.7 % — ABNORMAL HIGH (ref 43.0–77.0)
Platelets: 195 10*3/uL (ref 150.0–400.0)
RBC: 4.14 Mil/uL (ref 3.87–5.11)
RDW: 15.4 % (ref 11.5–15.5)
WBC: 9.3 10*3/uL (ref 4.0–10.5)

## 2020-11-30 LAB — COMPREHENSIVE METABOLIC PANEL
ALT: 10 U/L (ref 0–35)
AST: 14 U/L (ref 0–37)
Albumin: 3.5 g/dL (ref 3.5–5.2)
Alkaline Phosphatase: 97 U/L (ref 39–117)
BUN: 8 mg/dL (ref 6–23)
CO2: 28 mEq/L (ref 19–32)
Calcium: 8.9 mg/dL (ref 8.4–10.5)
Chloride: 105 mEq/L (ref 96–112)
Creatinine, Ser: 0.93 mg/dL (ref 0.40–1.20)
GFR: 69.1 mL/min (ref >60.00)
Glucose, Bld: 112 mg/dL — ABNORMAL HIGH (ref 70–99)
Potassium: 3.2 mEq/L — ABNORMAL LOW (ref 3.5–5.1)
Sodium: 139 mEq/L (ref 135–145)
Total Bilirubin: 0.4 mg/dL (ref 0.2–1.2)
Total Protein: 7.5 g/dL (ref 6.0–8.3)

## 2020-12-02 ENCOUNTER — Encounter: Payer: Self-pay | Admitting: *Deleted

## 2020-12-20 ENCOUNTER — Encounter: Payer: Self-pay | Admitting: Internal Medicine

## 2021-03-06 NOTE — Progress Notes (Deleted)
Cardiology Office Note   Date:  03/06/2021   ID:  Kaitlyn [name], DOB 17-Oct-1966, MRN [id number]  PCP:  Sharon Seller, NP  Cardiologist:   Dietrich Pates, MD   [name] for edema, abnorma chest CT    History of Present Illness: Kaitlyn [name] is a 55 y.o. female with a history of morbid obesity, sarcoidosis, HL, brhonchitis, dperession, OA, tobacco use and HTN   Followed by Alvina Filbert.  Seen in August   Complaints of swelling in legs and SOB   Pursed lip breathing   Does not use inhalers due to cost and no insurance.  Eats salt and fatty food She denies CP    CT of chest with plaquing of coronary arteries       Pt does admit to vaping  She was started on lasix 40 in august      LPipds in APril 2019 LDL 89  HDL 54    I saw the pt in cliic in 2019 and then as a televisit in 2021  No outpatient medications have been marked as taking for the 03/08/21 encounter (Appointment) with [name], MD.     Allergies:   Codeine, Cyclinex [tetracycline hcl], Milk thistle, and Morphine and related   Past Medical History:  Diagnosis Date  . Anxiety   . Asthma    inhalers   . Carpal tunnel syndrome 01/2015   right  . 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 Depression   . Extrinsic asthma, unspecified   . Hidradenitis   . Hyperlipidemia    takes pravachol  . Hypertension   . Keloid scar   . Obesity   . Osteoarthritis   . Osteoarthrosis, unspecified whether generalized or localized, unspecified site   . PONV (postoperative nausea and vomiting)   . 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 Sarcoidosis   . Shortness of breath    exertion  . Wears dentures    top    Past Surgical  History:  Procedure Laterality Date  . MULTIPLE TOOTH EXTRACTIONS  2010  . NASAL TURBINATE REDUCTION Bilateral 06/29/2009   inferior  . OPEN REDUCTION NASAL FRACTURE  06/29/2009   with exc. bx. left intranasal papilloma  . OTHER SURGICAL HISTORY  2010   cyst removal from right middle finger    . REMOVAL OF GROWTH FROM FINGER     DR Metro Kung  . VIDEO BRONCHOSCOPY WITH ENDOBRONCHIAL ULTRASOUND  03/01/2012     Social History:  The patient  reports that she has been smoking cigarettes and e-cigarettes. She has a 8.75 pack-year smoking history. She has never used smokeless tobacco. She reports current alcohol use. She reports that she does not use drugs.   Family History:  The patient's family history includes Arthritis in her mother; Cancer in her father and maternal aunt; Depression in her mother; Diabetes in her maternal aunt; Heart disease in her father; Hyperlipidemia in her mother; Hypertension in her father; Stroke in her mother.    ROS:  Please see the history of present illness. All other systems are reviewed and  Negative to the above problem except as noted.    PHYSICAL EXAM: VS:  There were no  vitals taken for this visit.  GEN: Morbidly obese 55 yo in no acute distress  HEENT: normal  Neck: no JVD, carotid bruits, or masses Cardiac: RRR; no murmurs, rubs, or gallops  Tr edema  Respiratory: Wheezes GI: soft, nontender, nondistended, + BS  No hepatomegaly  MS: no deformity Moving all extremities   Skin: warm and dry, no rash Neuro:  Strength and sensation are intact Psych: euthymic mood, full affect   EKG:  EKG is ordered today. SR 73bpm   PACs   LVH  Anteorseptal MI     Lipid Panel    Component Value Date/Time   CHOL 167 06/09/2019 1417   TRIG 80 06/09/2019 1417   HDL 59 06/09/2019 1417   CHOLHDL 2.8 06/09/2019 1417   CHOLHDL 3.0 12/28/2016 1013   VLDL 19 12/28/2016 1013   LDLCALC 92 06/09/2019 1417      Wt Readings from Last 3 Encounters:  11/09/20 (!) 317  lb (143.8 kg)  07/26/20 (!) 309 lb (140.2 kg)  03/04/20 (!) 313 lb (142 kg)      ASSESSMENT AND PLAN:  1  CAD   Pt with atherosclerosis on CT scan in 2017 (aorta and coronary arteries)She has signif reason for SOB with lung problems   I am not convinced of active angina I would recomm ASA 81 mg daily   She should also be on a statin  2   Edema   Pt admits to eating salty foods   Discussed Na restriction, fluid restriction   She does have tr edema    Can use lasix as needed  3   Pulm   Pt with signif pulm probl  COntinues to vape   Discussed quitting    4   Dyslipidemia  Pt on pravastatin   Needs tighter control with atherosclerosis   I would switch to lipitor 40    Will need f/u lpids  5   Substance abuse   Discussed the importance of quitting vaping     6   Morbid obesity   Disucssed wt loss, how it would improve symptoms.    Would like to see her in Jan / Feb 2020   Current medicines are reviewed at length with the patient today.  The patient does not have concerns regarding medicines.  Signed, Dietrich Pates, MD  03/06/2021 8:43 PM    [facility] [address] [place], [place], [place]  [address] Phone: [phone]; Fax: [phone]

## 2021-03-08 ENCOUNTER — Ambulatory Visit: Payer: BLUE CROSS/BLUE SHIELD | Admitting: Internal Medicine

## 2021-04-05 ENCOUNTER — Ambulatory Visit: Payer: BLUE CROSS/BLUE SHIELD | Admitting: Internal Medicine

## 2021-04-07 ENCOUNTER — Telehealth: Payer: BLUE CROSS/BLUE SHIELD

## 2021-04-07 NOTE — Telephone Encounter (Signed)
Patient called want to set up a virtual visit due to her swollen ankles. She has been given lasix in the past which help but she recently took some of her Mom's lasix that didn't work for her. She has mychart. Would prefer video/call because she doesn't have transportation at the time. To Kaitlyn [name]

## 2021-04-08 NOTE — Telephone Encounter (Signed)
Called patient. No answer. LMOM to return call to schedule, with the possibility of still needing to be seen in person after the virtual visit.

## 2021-04-11 ENCOUNTER — Other Ambulatory Visit: Payer: Self-pay

## 2021-04-11 ENCOUNTER — Telehealth (INDEPENDENT_AMBULATORY_CARE_PROVIDER_SITE_OTHER): Payer: BLUE CROSS/BLUE SHIELD | Admitting: Internal Medicine

## 2021-04-11 ENCOUNTER — Encounter: Payer: Self-pay | Admitting: Internal Medicine

## 2021-04-11 DIAGNOSIS — I10 Essential (primary) hypertension: Secondary | ICD-10-CM

## 2021-04-11 DIAGNOSIS — I2510 Atherosclerotic heart disease of native coronary artery without angina pectoris: Secondary | ICD-10-CM

## 2021-04-11 DIAGNOSIS — E782 Mixed hyperlipidemia: Secondary | ICD-10-CM | POA: Diagnosis not present

## 2021-04-11 NOTE — Progress Notes (Signed)
Virtual Visit via Video Note   This visit type was conducted due to national recommendations for restrictions regarding the COVID-19 Pandemic (e.g. social distancing) in an effort to limit this patient's exposure and mitigate transmission in our community.  Due to her co-morbid illnesses, this patient is at least at moderate risk for complications without adequate follow up.  This format is felt to be most appropriate for this patient at this time.  All issues noted in this document were discussed and addressed.  A limited physical exam was performed with this format.  Please refer to the patient's chart for her consent to telehealth for [facility].       Date:  04/11/2021   ID:  Kaitlyn [name], DOB June 05, 1966, MRN [id number] The patient was identified using 2 identifiers.  Patient Location: Home Provider Location: Office/Clinic   PCP:  Sharon Seller, NP   [facility] Providers Cardiologist:  Dietrich Pates, MD   Evaluation Performed:  Follow-Up Visit  Chief Complaint:  Pt presents for f/u of HTN and coronary artery plaquing    History of Present Illness:    Kaitlyn [name] is a 55 y.o. female with morbid obesity, sarcoid, HL, bronchitis, depression, OA, HTN, tob use.  CT showed  plaquing on coronary arteries      I last saw her as a televisit in 2020   Since seen she says she stays home   mostley   She says she is offo f the MTX   has to follow with V Sood     The pt has some chest pains   Substernal   Chronic   Aossiated with breathing    SHe denies palpitations   Does have LE edema  Admits to eating salty foods  Adds salt to foods   MOved recently and was eating pizza     SHe was drinking some coke   Now more water     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   PFT 03/13/12>>FEV1 1.65 (57%), FEV1% 58, TLC 4.44 (79%), DLCO 62%, no BD     Depression    Extrinsic asthma, unspecified    Hidradenitis    Hyperlipidemia    takes pravachol   Hypertension    Keloid scar    Obesity    Osteoarthritis    Osteoarthrosis, unspecified whether generalized or localized, unspecified site    PONV (postoperative nausea and vomiting)    Pulmonary sarcoidosis (HCC) 02/22/2012   PFT 03/13/12>>FEV1 1.65 (57%), FEV1% 58, TLC 4.44 (79%), DLCO 62%, no BD EBUS with Tbx 03/01/12>>Streptococcus pneumoniae in BAL, cytology/Tbx negative Likely sarcoidosis>>start prednisone 04/30/12    Sarcoidosis    Shortness of breath    exertion   Wears dentures    top   Past Surgical History:  Procedure Laterality Date   MULTIPLE TOOTH EXTRACTIONS  2010   NASAL TURBINATE REDUCTION Bilateral 06/29/2009   inferior   OPEN REDUCTION NASAL FRACTURE  06/29/2009   with exc. bx. left intranasal papilloma   OTHER SURGICAL HISTORY  2010   cyst removal from right middle finger     REMOVAL OF GROWTH FROM FINGER     DR MEYERDIERKS   VIDEO BRONCHOSCOPY WITH ENDOBRONCHIAL ULTRASOUND  03/01/2012     Current Meds  Medication Sig   acetaminophen (TYLENOL) 500 MG tablet Take 500  mg by mouth every 6 (six) hours as needed. For pain   albuterol (VENTOLIN HFA) 108 (90 Base) MCG/ACT inhaler Inhale 2 puffs into the lungs every 6 (six) hours as needed for wheezing or shortness of breath.   aspirin EC 81 MG tablet Take 81 mg by mouth daily. Swallow whole.   buPROPion (WELLBUTRIN SR) 150 MG 12 hr tablet Take 1 tablet (150 mg total) by mouth 2 (two) times daily.   cephALEXin (KEFLEX) 500 MG capsule Take one (1) capsule (500 mg) by mouth twice daily four (4) times per week.   clindamycin (CLEOCIN T) 1 % lotion Apply 1 application topically 2 (two) times daily as needed (affected areas).   diclofenac Sodium (VOLTAREN) 1 % GEL Apply 4 g topically as needed.   GLUCOSAMINE HCL PO Take 1 tablet by mouth 2 (two) times daily.   hydrocortisone 2.5 % cream Apply 1 application topically 2 (two)  times daily as needed for rash.   ipratropium-albuterol (DUONEB) 0.5-2.5 (3) MG/3ML SOLN Take 3 mLs by nebulization every 4 (four) hours as needed (shortness of breath  (D86.0, J44.9)).   losartan-hydrochlorothiazide (HYZAAR) 100-12.5 MG tablet TAKE 1 TABLET BY MOUTH DAILY   mometasone-formoterol (DULERA) 200-5 MCG/ACT AERO Inhale 2 puffs into the lungs 2 (two) times daily.   montelukast (SINGULAIR) 10 MG tablet TAKE 1 TABLET(10 MG) BY MOUTH AT BEDTIME   omega-3 fish oil (MAXEPA) 1000 MG CAPS capsule Take by mouth.   rosuvastatin (CRESTOR) 10 MG tablet Take 1 tablet (10 mg total) by mouth daily.   sertraline (ZOLOFT) 25 MG tablet TAKE 1 TABLET(25 MG) BY MOUTH DAILY   traMADol (ULTRAM) 50 MG tablet Take 1 tablet (50 mg total) by mouth every 6 (six) hours as needed for moderate pain.   TURMERIC PO Take 538 mg by mouth 2 (two) times a day.    vitamin B-12 (CYANOCOBALAMIN) 100 MCG tablet Take 200 mcg by mouth daily. 1000 x2     Allergies:   Codeine, Cyclinex [tetracycline hcl], Milk thistle, and Morphine and related   Social History   Tobacco Use   Smoking status: Some Days    Packs/day: 0.25    Years: 35.00    Pack years: 8.75    Types: E-cigarettes, Cigarettes    Last attempt to quit: 01/22/2012    Years since quitting: 9.2   Smokeless tobacco: Never   Tobacco comments:    1-2 every couple of weeks.  Vaping Use   Vaping Use: Never used  Substance Use Topics   Alcohol use: Yes    Comment: occasionally   Drug use: No     Family Hx: The patient's family history includes Arthritis in her mother; Cancer in her father and maternal aunt; Depression in her mother; Diabetes in her maternal aunt; Heart disease in her father; Hyperlipidemia in her mother; Hypertension in her father; Stroke in her mother.  ROS:   Please see the history of present illness.     All other systems reviewed and are negative.   Prior CV studies:   The following studies were reviewed today:    Labs/Other  Tests and Data Reviewed:    EKG:  No ECG reviewed.  Recent Labs: 11/30/2020: ALT 10; BUN 8; Creatinine, Ser 0.93; Hemoglobin 12.6; Platelets 195.0; Potassium 3.2; Sodium 139   Recent Lipid Panel Lab Results  Component Value Date/Time   CHOL 167 06/09/2019 02:17 PM   TRIG 80 06/09/2019 02:17 PM   HDL 59 06/09/2019 02:17 PM   CHOLHDL  2.8 06/09/2019 02:17 PM   CHOLHDL 3.0 12/28/2016 10:13 AM   LDLCALC 92 06/09/2019 02:17 PM    Wt Readings from Last 3 Encounters:  04/11/21 300 lb (136.1 kg)  11/09/20 (!) 317 lb (143.8 kg)  07/26/20 (!) 309 lb (140.2 kg)     Objective:    Vital Signs:  BP 138/73   Pulse 88   Ht 5\' 7"  (1.702 m)   Wt 300 lb (136.1 kg)   BMI 46.99 kg/m    VS reviewed  Pt in NAD    ASSESSMENT & PLAN:    1  Chest pain   Atypical for cardiac   I think more related to pulmonary processes  2  CAD  Seen as plaquing/calcifications  on CT scans   No event   I am not convinced she is having angina  CP is atypical    3  HTN  Fair   continues to follow    4  HL   On Crestor 10 mg   Will need to get fsating lipids  when sees PCP  5  Sarcoid   Follows with V Sood       COVID-19 Education: The signs and symptoms of COVID-19 were discussed with the patient and how to seek care for testing (follow up with PCP or arrange E-visit).  The importance of social distancing was discussed today.  Time:   Today, I have spent  minutes with the patient with telehealth technology discussing the above problems.     Medication Adjustments/Labs and Tests Ordered: Current medicines are reviewed at length with the patient today.  Concerns regarding medicines are outlined above.   Tests Ordered: No orders of the defined types were placed in this encounter.   Medication Changes: No orders of the defined types were placed in this encounter.   Follow Up:  In Person next spring  Signed, 10-03-1980, MD  04/11/2021 2:36 PM   [facility]

## 2021-04-15 ENCOUNTER — Other Ambulatory Visit: Payer: Self-pay | Admitting: Internal Medicine

## 2021-04-15 ENCOUNTER — Telehealth: Payer: Self-pay | Admitting: Internal Medicine

## 2021-04-15 ENCOUNTER — Other Ambulatory Visit: Payer: Self-pay | Admitting: Primary Care

## 2021-04-15 DIAGNOSIS — R609 Edema, unspecified: Secondary | ICD-10-CM

## 2021-04-15 NOTE — Telephone Encounter (Signed)
I do not see any mention of these medications in recent virtual note.  Will route to Dr. Tenny Craw for clarification.

## 2021-04-15 NOTE — Telephone Encounter (Signed)
Pt c/o medication issue:  1. Name of Medication: lasix 40 mg, potassium 20 meq  2. How are you currently taking this medication (dosage and times per day)? No currently taking  3. Are you having a reaction (difficulty breathing--STAT)?   4. What is your medication issue? Patient states she was told during her virtual appointment she would be given lasix and potassium, but her pharmacy has not received it.

## 2021-04-19 NOTE — Telephone Encounter (Signed)
Pt can take 40 mg Lasix and 20 meq KCL   1 to 2 times per week  for swelling  Would check BMET and BNP in 3 wks

## 2021-04-20 MED ORDER — POTASSIUM CHLORIDE CRYS ER 20 MEQ PO TBCR: EXTENDED_RELEASE_TABLET | ORAL | 1 refills | Status: AC

## 2021-04-20 MED ORDER — FUROSEMIDE 40 MG PO TABS: ORAL_TABLET | ORAL | 1 refills | Status: AC

## 2021-04-20 NOTE — Telephone Encounter (Signed)
Spoke with patient. Sent prescriptions to [facility] per her request.  She is able to come in for labs on BMET, BNP.  Voices understanding of how to take medications.  If needing more frequently than 1-2 times per week she will call to report.

## 2021-04-21 ENCOUNTER — Telehealth: Payer: Self-pay

## 2021-04-21 ENCOUNTER — Other Ambulatory Visit: Payer: Self-pay

## 2021-04-21 NOTE — Telephone Encounter (Signed)
**Note De-Identified  Obfuscation** I started a Klor-Con PA through covermymeds. Key: [id number]

## 2021-04-25 NOTE — Telephone Encounter (Signed)
**Note De-Identified  Obfuscation** Following mesaage received from covermymeds: Lerlene Pen Key: [id number] Outcome: Cancelled on June 23 Not Reviewable Drug: Klor-Con M20 er tablets Form: [employer] [place] [employer] Form (CB)  I called [facility] pharmacy and was advised that the pt picked #24 tablets on 6/25 and paid $6.97. The pharmacist states that a PA may not be necessary as the pt is trying to get her medications filled at the same time to avoid her having to go to the pharmacy more than once to pick up her medication refills. (She wants to pick up all of her medications at the same time).

## 2021-05-13 ENCOUNTER — Ambulatory Visit: Payer: BLUE CROSS/BLUE SHIELD | Admitting: Nurse Practitioner

## 2021-05-13 ENCOUNTER — Other Ambulatory Visit: Payer: BLUE CROSS/BLUE SHIELD

## 2021-05-23 ENCOUNTER — Other Ambulatory Visit: Payer: BLUE CROSS/BLUE SHIELD

## 2021-05-26 ENCOUNTER — Other Ambulatory Visit: Payer: BLUE CROSS/BLUE SHIELD

## 2021-06-06 ENCOUNTER — Other Ambulatory Visit: Payer: BLUE CROSS/BLUE SHIELD

## 2021-06-07 ENCOUNTER — Other Ambulatory Visit: Payer: BLUE CROSS/BLUE SHIELD

## 2021-06-07 ENCOUNTER — Other Ambulatory Visit: Payer: Self-pay

## 2021-06-07 DIAGNOSIS — R609 Edema, unspecified: Secondary | ICD-10-CM

## 2021-06-08 LAB — PRO B NATRIURETIC PEPTIDE: NT-Pro BNP: 72 pg/mL (ref 0–287)

## 2021-06-08 LAB — BASIC METABOLIC PANEL
BUN/Creatinine Ratio: 9 (ref 9–23)
BUN: 9 mg/dL (ref 6–24)
CO2: 22 mmol/L (ref 20–29)
Calcium: 9.3 mg/dL (ref 8.7–10.2)
Chloride: 102 mmol/L (ref 96–106)
Creatinine, Ser: 1.05 mg/dL — ABNORMAL HIGH (ref 0.57–1.00)
Glucose: 108 mg/dL — ABNORMAL HIGH (ref 65–99)
Potassium: 4.3 mmol/L (ref 3.5–5.2)
Sodium: 141 mmol/L (ref 134–144)
eGFR: 62 mL/min/{1.73_m2} (ref >59)

## 2021-06-14 ENCOUNTER — Telehealth: Payer: Self-pay

## 2021-06-14 NOTE — Telephone Encounter (Signed)
Okay to give 1 week supply but needs to keep upcoming appt for further refills.

## 2021-06-14 NOTE — Telephone Encounter (Signed)
Patient called requesting a refill on her Bupropion 150 mg. I advised patient she will have to schedule appointment for the refill since last office was on 08/26/2020 and she states an upcoming visit on 06/20/2021. Patient has cancelled appointments in the past. Please advise if any refill for the medication.

## 2021-06-14 NOTE — Telephone Encounter (Signed)
Patient was advised and verbalized understanding and states she will be at her appointment on 06/20/2021.

## 2021-06-20 ENCOUNTER — Ambulatory Visit (INDEPENDENT_AMBULATORY_CARE_PROVIDER_SITE_OTHER): Payer: BLUE CROSS/BLUE SHIELD | Admitting: Nurse Practitioner

## 2021-06-20 ENCOUNTER — Other Ambulatory Visit: Payer: Self-pay

## 2021-06-20 ENCOUNTER — Encounter: Payer: Self-pay | Admitting: Nurse Practitioner

## 2021-06-20 VITALS — BP 140/90 | HR 57 | Temp 97.3°F | Ht 67.0 in | Wt 319.4 lb

## 2021-06-20 DIAGNOSIS — E782 Mixed hyperlipidemia: Secondary | ICD-10-CM | POA: Diagnosis not present

## 2021-06-20 DIAGNOSIS — F17210 Nicotine dependence, cigarettes, uncomplicated: Secondary | ICD-10-CM

## 2021-06-20 DIAGNOSIS — F321 Major depressive disorder, single episode, moderate: Secondary | ICD-10-CM

## 2021-06-20 DIAGNOSIS — F3341 Major depressive disorder, recurrent, in partial remission: Secondary | ICD-10-CM | POA: Diagnosis not present

## 2021-06-20 DIAGNOSIS — L732 Hidradenitis suppurativa: Secondary | ICD-10-CM

## 2021-06-20 DIAGNOSIS — I10 Essential (primary) hypertension: Secondary | ICD-10-CM

## 2021-06-20 DIAGNOSIS — M1991 Primary osteoarthritis, unspecified site: Secondary | ICD-10-CM

## 2021-06-20 DIAGNOSIS — J449 Chronic obstructive pulmonary disease, unspecified: Secondary | ICD-10-CM

## 2021-06-20 MED ORDER — TRAMADOL HCL 50 MG PO TABS: 50.0000 mg | ORAL_TABLET | Freq: Two times a day (BID) | ORAL | 0 refills | Status: DC | PRN

## 2021-06-20 MED ORDER — SERTRALINE HCL 25 MG PO TABS: 25.0000 mg | ORAL_TABLET | Freq: Every day | ORAL | 1 refills | Status: DC

## 2021-06-20 MED ORDER — BUPROPION HCL ER (SR) 150 MG PO TB12: 150.0000 mg | ORAL_TABLET | Freq: Two times a day (BID) | ORAL | 5 refills | Status: DC

## 2021-06-20 NOTE — Progress Notes (Signed)
Careteam: Patient Care Team: Lauree Chandler, NP as PCP - General (Geriatric Medicine) Fay Records, MD as PCP - Cardiology (Cardiology)  PLACE OF SERVICE:  [place]  Advanced Directive information    Allergies  Allergen Reactions   Codeine Other (See Comments)    Reaction unknown   Cyclinex [Tetracycline Hcl] Other (See Comments)    Reaction unknown, all "cyclines"   Milk Thistle Hives   Morphine And Related Other (See Comments)    Reaction unknown    Chief Complaint  Patient presents with   Medical Management of Chronic Issues    10 month follow up Patient has concerns about weight management.   Health Maintenance    HIV screening, Pap smear, colonoscopy, Shringrix, COVID booster, mammogram Flu vaccine     HPI: Patient is a 55 y.o. female for routine follow up. Previously with Dr Mariea Clonts. Pt with hx of COPD followed by   Reports mood has been poor- she ran out and now she has been having more agitation. Continues on zoloft.   Using e-cigs to help her quit smoking. Followed by pulmonary, medication was adjusted in January but she has not followed up.  Reports she is more short of breath. She has pulmonary sarcoidosis.   Hyperlipidemia- continues on crestor.   Hypertension- takes blood pressure at night but did not take medication last night. Occasionally checks blood pressure at home. When she takes medication it is normal.  She was added lasix due to LE edema which has improved.   Reports she has knee pain- uses tramadol when pain is bad. Will use tylenol but does not help that much.   Went to a program for weight loss but decided to stop because she was very focused on food.  Has not been getting exercise due to not being able to breath.   Followed by dermatologist for hidradenitis.   Review of Systems:  Review of Systems  Constitutional:  Negative for chills, fever and weight loss.  HENT:  Negative for tinnitus.   Respiratory:  Negative for cough,  sputum production and shortness of breath.   Cardiovascular:  Negative for chest pain, palpitations and leg swelling.  Gastrointestinal:  Negative for abdominal pain, constipation, diarrhea and heartburn.  Genitourinary:  Negative for dysuria, frequency and urgency.  Musculoskeletal:  Positive for joint pain and myalgias. Negative for back pain and falls.  Skin: Negative.   Neurological:  Negative for dizziness and headaches.  Psychiatric/Behavioral:  Negative for depression and memory loss. The patient does not have insomnia.    Past Medical History:  Diagnosis Date   Anxiety    Asthma    inhalers    Carpal tunnel syndrome 01/2015   right   COPD with chronic bronchitis ([place]) 03/13/2012   PFT 03/13/12>>FEV1 1.65 (57%), FEV1% 58, TLC 4.44 (79%), DLCO 62%, no BD    Depression    Extrinsic asthma, unspecified    Hidradenitis    Hyperlipidemia    takes pravachol   Hypertension    Keloid scar    Obesity    Osteoarthritis    Osteoarthrosis, unspecified whether generalized or localized, unspecified site    PONV (postoperative nausea and vomiting)    Pulmonary sarcoidosis ([place]) 02/22/2012   PFT 03/13/12>>FEV1 1.65 (57%), FEV1% 58, TLC 4.44 (79%), DLCO 62%, no BD EBUS with Tbx 03/01/12>>Streptococcus pneumoniae in BAL, cytology/Tbx negative Likely sarcoidosis>>start prednisone 04/30/12    Sarcoidosis    Shortness of breath    exertion   Wears  dentures    top   Past Surgical History:  Procedure Laterality Date   MULTIPLE TOOTH EXTRACTIONS  2010   NASAL TURBINATE REDUCTION Bilateral 06/29/2009   inferior   OPEN REDUCTION NASAL FRACTURE  06/29/2009   with exc. bx. left intranasal papilloma   OTHER SURGICAL HISTORY  2010   cyst removal from right middle finger     REMOVAL OF GROWTH FROM FINGER     DR MEYERDIERKS   VIDEO BRONCHOSCOPY WITH ENDOBRONCHIAL ULTRASOUND  03/01/2012   Social History:   reports that she has been smoking e-cigarettes. She has never used smokeless tobacco. She  reports current alcohol use. She reports that she does not use drugs.  Family History  Problem Relation Age of Onset   Hyperlipidemia Mother    Stroke Mother    Arthritis Mother    Depression Mother    Heart disease Father    Hypertension Father    Cancer Father    Cancer Maternal Aunt        mother's aunt   Diabetes Maternal Aunt     Medications: Patient's Medications  New Prescriptions   No medications on file  Previous Medications   ACETAMINOPHEN (TYLENOL) 500 MG TABLET    Take 500 mg by mouth every 6 (six) hours as needed. For pain   ALBUTEROL (VENTOLIN HFA) 108 (90 BASE) MCG/ACT INHALER    Inhale 2 puffs into the lungs every 6 (six) hours as needed for wheezing or shortness of breath.   ASPIRIN EC 81 MG TABLET    Take 81 mg by mouth daily. Swallow whole.   BUPROPION (WELLBUTRIN SR) 150 MG 12 HR TABLET    Take 1 tablet (150 mg total) by mouth 2 (two) times daily.   CEPHALEXIN (KEFLEX) 500 MG CAPSULE    Take one (1) capsule (500 mg) by mouth twice daily four (4) times per week.   CLINDAMYCIN (CLEOCIN T) 1 % LOTION    Apply 1 application topically 2 (two) times daily as needed (affected areas).   DICLOFENAC SODIUM (VOLTAREN) 1 % GEL    Apply 4 g topically as needed.   DULERA 200-5 MCG/ACT AERO    INHALE 2 PUFFS INTO THE LUNGS TWICE DAILY   FUROSEMIDE (LASIX) 40 MG TABLET    Take one tablet 1-2 times per week as needed for swelling.   GLUCOSAMINE HCL PO    Take 1 tablet by mouth 2 (two) times daily.   HYDROCORTISONE 2.5 % CREAM    Apply 1 application topically 2 (two) times daily as needed for rash.   IPRATROPIUM-ALBUTEROL (DUONEB) 0.5-2.5 (3) MG/3ML SOLN    Take 3 mLs by nebulization every 4 (four) hours as needed (shortness of breath  (D86.0, J44.9)).   LOSARTAN-HYDROCHLOROTHIAZIDE (HYZAAR) 100-12.5 MG TABLET    TAKE 1 TABLET BY MOUTH DAILY   MONTELUKAST (SINGULAIR) 10 MG TABLET    TAKE 1 TABLET(10 MG) BY MOUTH AT BEDTIME   OMEGA-3 FISH OIL (MAXEPA) 1000 MG CAPS CAPSULE    Take  by mouth.   POTASSIUM CHLORIDE SA (KLOR-CON) 20 MEQ TABLET    Take one tablet 1-2 times per week as needed.  Take with Lasix.   ROSUVASTATIN (CRESTOR) 10 MG TABLET    TAKE 1 TABLET(10 MG) BY MOUTH DAILY   SERTRALINE (ZOLOFT) 25 MG TABLET    TAKE 1 TABLET(25 MG) BY MOUTH DAILY   TRAMADOL (ULTRAM) 50 MG TABLET    Take 1 tablet (50 mg total) by mouth every 6 (six) hours  as needed for moderate pain.   TURMERIC PO    Take 538 mg by mouth 2 (two) times a day.    VITAMIN B-12 (CYANOCOBALAMIN) 100 MCG TABLET    Take 200 mcg by mouth daily. 1000 x2  Modified Medications   No medications on file  Discontinued Medications   No medications on file    Physical Exam:  Vitals:   06/20/21 1035  BP: 140/90  Pulse: (!) 57  Temp: (!) 97.3 F (36.3 C)  SpO2: 98%  Weight: (!) 319 lb 6.4 oz (144.9 kg)  Height: '5\' 7"'  (1.702 m)   Body mass index is 50.03 kg/m. Wt Readings from Last 3 Encounters:  06/20/21 (!) 319 lb 6.4 oz (144.9 kg)  04/11/21 300 lb (136.1 kg)  11/09/20 (!) 317 lb (143.8 kg)    Physical Exam Constitutional:      General: She is not in acute distress.    Appearance: She is well-developed. She is obese. She is not diaphoretic.  HENT:     Head: Normocephalic and atraumatic.     Mouth/Throat:     Pharynx: No oropharyngeal exudate.  Eyes:     Conjunctiva/sclera: Conjunctivae normal.     Pupils: Pupils are equal, round, and reactive to light.  Cardiovascular:     Rate and Rhythm: Normal rate and regular rhythm.     Heart sounds: Normal heart sounds.  Pulmonary:     Effort: Pulmonary effort is normal.     Breath sounds: Normal breath sounds.  Abdominal:     General: Bowel sounds are normal.     Palpations: Abdomen is soft.  Musculoskeletal:     Cervical back: Normal range of motion and neck supple.     Right lower leg: No edema.     Left lower leg: No edema.  Skin:    General: Skin is warm and dry.  Neurological:     Mental Status: She is alert and oriented to person,  place, and time.  Psychiatric:        Mood and Affect: Mood normal.    Labs reviewed: Basic Metabolic Panel: Recent Labs    11/30/20 1229 06/07/21 0746  NA 139 141  K 3.2* 4.3  CL 105 102  CO2 28 22  GLUCOSE 112* 108*  BUN 8 9  CREATININE 0.93 1.05*  CALCIUM 8.9 9.3   Liver Function Tests: Recent Labs    11/30/20 1229  AST 14  ALT 10  ALKPHOS 97  BILITOT 0.4  PROT 7.5  ALBUMIN 3.5   No results for input(s): LIPASE, AMYLASE in the last 8760 hours. No results for input(s): AMMONIA in the last 8760 hours. CBC: Recent Labs    11/30/20 1229  WBC 9.3  NEUTROABS 7.2  HGB 12.6  HCT 38.1  MCV 92.0  PLT 195.0   Lipid Panel: No results for input(s): CHOL, HDL, LDLCALC, TRIG, CHOLHDL, LDLDIRECT in the last 8760 hours. TSH: No results for input(s): TSH in the last 8760 hours. A1C: Lab Results  Component Value Date   HGBA1C 5.4 12/28/2016     Assessment/Plan 1. Tobacco dependence due to cigarettes -encouraged cessation. Continues to follow up with pulmonary due to COPD and pulmonary sarcoidosis  2. Recurrent major depressive disorder, in partial remission (HCC) - buPROPion (WELLBUTRIN SR) 150 MG 12 hr tablet; Take 1 tablet (150 mg total) by mouth 2 (two) times daily.  Dispense: 60 tablet; Refill: 5  4. Benign essential HTN --Blood pressure elevated today, but typically well controlled -  Patient reports bp typically elevated in office and home blood pressures are well controlled -No changes to medications today  -will have pt continue to monitor home bp goal <898/42 -follow metabolic panel - CMP with eGFR([facility]) - CBC with Differential/Platelet  5. Mixed hyperlipidemia -continues on crestor, dietary modifications encouraged.  - CMP with eGFR([facility]) - Lipid Panel  6. COPD with chronic bronchitis -continues on singulair and dulera daily. Followed by pulmonary.   7. Primary osteoarthritis, unspecified site -ongoing, uses tramadol sparingly when tylenol is  not effective in controlling pain.  - traMADol (ULTRAM) 50 MG tablet; Take 1 tablet (50 mg total) by mouth every 12 (twelve) hours as needed for moderate pain.  Dispense: 30 tablet; Refill: 0  8. Morbid obesity ([place]) -education provided on healthy weight loss through increase in physical activity and proper nutrition  - Amb Ref to Medical Weight Management  9. Depression, major, single episode, moderate ([place]) -controlled on medication however she has been out of Wellbutrin. Refill provided today.  - sertraline (ZOLOFT) 25 MG tablet; Take 1 tablet (25 mg total) by mouth daily.  Dispense: 90 tablet; Refill: 1  10. Hidradenitis Followed by dermatology  Continues on keflex daily   Next appt: 3 months for complete physical and preventative healthcare as she is in need of PAP Travis Mastel K. [place], [facility] [phone]

## 2021-06-21 LAB — LIPID PANEL
Cholesterol: 193 mg/dL (ref <200)
HDL: 49 mg/dL — ABNORMAL LOW (ref >=50)
LDL Cholesterol (Calc): 123 mg/dL (calc) — ABNORMAL HIGH
Non-HDL Cholesterol (Calc): 144 mg/dL (calc) — ABNORMAL HIGH (ref <130)
Total CHOL/HDL Ratio: 3.9 (calc) (ref <5.0)
Triglycerides: 107 mg/dL (ref <150)

## 2021-06-21 LAB — COMPLETE METABOLIC PANEL WITH GFR
AG Ratio: 0.9 (calc) — ABNORMAL LOW (ref 1.0–2.5)
ALT: 10 U/L (ref 6–29)
AST: 15 U/L (ref 10–35)
Albumin: 3.5 g/dL — ABNORMAL LOW (ref 3.6–5.1)
Alkaline phosphatase (APISO): 107 U/L (ref 37–153)
BUN: 13 mg/dL (ref 7–25)
CO2: 25 mmol/L (ref 20–32)
Calcium: 9.1 mg/dL (ref 8.6–10.4)
Chloride: 107 mmol/L (ref 98–110)
Creat: 0.95 mg/dL (ref 0.50–1.03)
Globulin: 3.8 g/dL (calc) — ABNORMAL HIGH (ref 1.9–3.7)
Glucose, Bld: 99 mg/dL (ref 65–139)
Potassium: 4.1 mmol/L (ref 3.5–5.3)
Sodium: 138 mmol/L (ref 135–146)
Total Bilirubin: 0.4 mg/dL (ref 0.2–1.2)
Total Protein: 7.3 g/dL (ref 6.1–8.1)
eGFR: 70 mL/min/{1.73_m2} (ref >=60)

## 2021-06-21 LAB — CBC WITH DIFFERENTIAL/PLATELET
Absolute Monocytes: 572 cells/uL (ref 200–950)
Basophils Absolute: 26 cells/uL (ref 0–200)
Basophils Relative: 0.3 %
Eosinophils Absolute: 97 cells/uL (ref 15–500)
Eosinophils Relative: 1.1 %
HCT: 43.4 % (ref 35.0–45.0)
Hemoglobin: 13.8 g/dL (ref 11.7–15.5)
Lymphs Abs: 1830 cells/uL (ref 850–3900)
MCH: 28.6 pg (ref 27.0–33.0)
MCHC: 31.8 g/dL — ABNORMAL LOW (ref 32.0–36.0)
MCV: 89.9 fL (ref 80.0–100.0)
MPV: 12.5 fL (ref 7.5–12.5)
Monocytes Relative: 6.5 %
Neutro Abs: 6274 cells/uL (ref 1500–7800)
Neutrophils Relative %: 71.3 %
Platelets: 216 10*3/uL (ref 140–400)
RBC: 4.83 10*6/uL (ref 3.80–5.10)
RDW: 13.5 % (ref 11.0–15.0)
Total Lymphocyte: 20.8 %
WBC: 8.8 10*3/uL (ref 3.8–10.8)

## 2021-06-29 ENCOUNTER — Telehealth: Payer: Self-pay | Admitting: Internal Medicine

## 2021-06-29 DIAGNOSIS — E782 Mixed hyperlipidemia: Secondary | ICD-10-CM

## 2021-06-29 DIAGNOSIS — Z79899 Other long term (current) drug therapy: Secondary | ICD-10-CM

## 2021-06-29 MED ORDER — ROSUVASTATIN CALCIUM 40 MG PO TABS: 40.0000 mg | ORAL_TABLET | Freq: Every day | ORAL | 3 refills | Status: DC

## 2021-06-29 NOTE — Addendum Note (Signed)
Addended by: Bertram Millard on: 06/29/2021 11:16 AM   Modules accepted: Orders

## 2021-06-29 NOTE — Telephone Encounter (Signed)
Would recomm increasing Crestor to 40 mg daily  Lipomed in 8 wks with AST   Pt advised and will take 4 of her Crestor 10 mg for now since she jut got a 90 day supply.. will send new RX to her pharmacy to be profiled.   She will return 08/29/21 for fasting labs.

## 2021-06-29 NOTE — Telephone Encounter (Signed)
° °  Pt is returning call to get lab result °

## 2021-08-29 ENCOUNTER — Other Ambulatory Visit: Payer: BLUE CROSS/BLUE SHIELD

## 2021-09-06 ENCOUNTER — Encounter: Payer: Self-pay | Admitting: Family Medicine

## 2021-09-07 NOTE — Progress Notes (Signed)
This encounter was created in error - please disregard.  This encounter was created in error - please disregard.

## 2021-09-14 ENCOUNTER — Ambulatory Visit (INDEPENDENT_AMBULATORY_CARE_PROVIDER_SITE_OTHER): Payer: Self-pay | Admitting: Family Medicine

## 2021-09-14 ENCOUNTER — Encounter: Payer: Self-pay | Admitting: Family Medicine

## 2021-09-14 ENCOUNTER — Other Ambulatory Visit: Payer: Self-pay

## 2021-09-14 DIAGNOSIS — E782 Mixed hyperlipidemia: Secondary | ICD-10-CM

## 2021-09-14 DIAGNOSIS — J449 Chronic obstructive pulmonary disease, unspecified: Secondary | ICD-10-CM

## 2021-09-14 DIAGNOSIS — D860 Sarcoidosis of lung: Secondary | ICD-10-CM

## 2021-09-14 DIAGNOSIS — I10 Essential (primary) hypertension: Secondary | ICD-10-CM

## 2021-09-14 MED ORDER — PHENTERMINE HCL 37.5 MG PO CAPS: 37.5000 mg | ORAL_CAPSULE | ORAL | 0 refills | Status: DC

## 2021-09-14 NOTE — Progress Notes (Signed)
Provider:  Jacalyn Lefevre, MD  Careteam: Patient Care Team: Sharon Seller, NP as PCP - General (Geriatric Medicine) [name], MD as PCP - Cardiology (Cardiology)  PLACE OF SERVICE:  [facility]  Advanced Directive information    Allergies  Allergen Reactions   Codeine Other (See Comments)    Reaction unknown   Cyclinex [Tetracycline Hcl] Other (See Comments)    Reaction unknown, all "cyclines"   Milk Thistle Hives   Morphine And Related Other (See Comments)    Reaction unknown    Chief Complaint  Patient presents with   Annual Exam    Patient presents today for a complete physical exam.   Quality Metric Gaps    Pap, mammogram, colonoscopy, HIV screening, Flu, zoster and COVID     HPI: Patient is a 55 y.o. female patient presents today with left lower back pain.  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  Back pain has been present for some weeks but seems to be getting better.  There is no radiculopathy.  It is concentrated in her left low back. She has multiple other problems that she has somewhat neglected since COVID.  She has not followed up with her cardiologist, pulmonologist for sarcoid and COPD. She does take chronic antibiotics for hidradenitis as preventive. She has gained weight.  Had formally been on a diet with use of phentermine which she requests today to help her.  It seems like her eating is a result of boredom and inactivity.  She certainly has insight as to foods that she should try to avoid but does not do a good job and avoidance.  Review of Systems:  Review of Systems  HENT: Negative.    Eyes: Negative.   Respiratory: Negative.    Cardiovascular: Negative.   Gastrointestinal: Negative.   Genitourinary: Negative.   Musculoskeletal:  Positive for back pain.  Skin: Negative.   All other systems reviewed and are negative.  Past Medical History:  Diagnosis Date   Anxiety    Asthma    inhalers    Carpal tunnel  syndrome 01/2015   right   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   PFT 03/13/12>>FEV1 1.65 (57%), FEV1% 58, TLC 4.44 (79%), DLCO 62%, no BD    Depression    Extrinsic asthma, unspecified    Hidradenitis    Hyperlipidemia    takes pravachol   Hypertension    Keloid scar    Obesity    Osteoarthritis    Osteoarthrosis, unspecified whether generalized or localized, unspecified site    PONV (postoperative nausea and vomiting)    Pulmonary sarcoidosis (HCC) 02/22/2012   PFT 03/13/12>>FEV1 1.65 (57%), FEV1% 58, TLC 4.44 (79%), DLCO 62%, no BD EBUS with Tbx 03/01/12>>Streptococcus pneumoniae in BAL, cytology/Tbx negative Likely sarcoidosis>>start prednisone 04/30/12    Sarcoidosis    Shortness of breath    exertion   Wears dentures    top   Past Surgical History:  Procedure Laterality Date   MULTIPLE TOOTH EXTRACTIONS  2010   NASAL TURBINATE REDUCTION Bilateral 06/29/2009   inferior   OPEN REDUCTION NASAL FRACTURE  06/29/2009   with exc. bx. left intranasal papilloma   OTHER SURGICAL HISTORY  2010   cyst removal from right middle finger     REMOVAL OF GROWTH FROM FINGER     DR MEYERDIERKS   VIDEO BRONCHOSCOPY WITH ENDOBRONCHIAL ULTRASOUND  03/01/2012   Social History:   reports that she has been  smoking e-cigarettes. She has never used smokeless tobacco. She reports current alcohol use. She reports that she does not use drugs.  Family History  Problem Relation Age of Onset   Hyperlipidemia Mother    Stroke Mother    Arthritis Mother    Depression Mother    Heart disease Father    Hypertension Father    Cancer Father    Cancer Maternal Aunt        mother's aunt   Diabetes Maternal Aunt     Medications: Patient's Medications  New Prescriptions   PHENTERMINE 37.5 MG CAPSULE    Take 1 capsule (37.5 mg total) by mouth every morning.  Previous Medications   ACETAMINOPHEN (TYLENOL) 500 MG TABLET    Take 500 mg by mouth every 6 (six) hours as needed. For pain   ALBUTEROL  (VENTOLIN HFA) 108 (90 BASE) MCG/ACT INHALER    Inhale 2 puffs into the lungs every 6 (six) hours as needed for wheezing or shortness of breath.   ASPIRIN EC 81 MG TABLET    Take 81 mg by mouth daily. Swallow whole.   BUPROPION (WELLBUTRIN SR) 150 MG 12 HR TABLET    Take 1 tablet (150 mg total) by mouth 2 (two) times daily.   CEPHALEXIN (KEFLEX) 500 MG CAPSULE    Take one (1) capsule (500 mg) by mouth twice daily four (4) times per week.   CLINDAMYCIN (CLEOCIN T) 1 % LOTION    Apply 1 application topically 2 (two) times daily as needed (affected areas).   DICLOFENAC SODIUM (VOLTAREN) 1 % GEL    Apply 4 g topically as needed.   DULERA 200-5 MCG/ACT AERO    INHALE 2 PUFFS INTO THE LUNGS TWICE DAILY   FUROSEMIDE (LASIX) 40 MG TABLET    Take one tablet 1-2 times per week as needed for swelling.   GLUCOSAMINE HCL PO    Take 1 tablet by mouth 2 (two) times daily.   HYDROCORTISONE 2.5 % CREAM    Apply 1 application topically 2 (two) times daily as needed for rash.   IPRATROPIUM-ALBUTEROL (DUONEB) 0.5-2.5 (3) MG/3ML SOLN    Take 3 mLs by nebulization every 4 (four) hours as needed (shortness of breath  (D86.0, J44.9)).   LOSARTAN-HYDROCHLOROTHIAZIDE (HYZAAR) 100-12.5 MG TABLET    TAKE 1 TABLET BY MOUTH DAILY   MONTELUKAST (SINGULAIR) 10 MG TABLET    TAKE 1 TABLET(10 MG) BY MOUTH AT BEDTIME   OMEGA-3 FISH OIL (MAXEPA) 1000 MG CAPS CAPSULE    Take by mouth.   POTASSIUM CHLORIDE SA (KLOR-CON) 20 MEQ TABLET    Take one tablet 1-2 times per week as needed.  Take with Lasix.   ROSUVASTATIN (CRESTOR) 40 MG TABLET    Take 1 tablet (40 mg total) by mouth daily.   SERTRALINE (ZOLOFT) 25 MG TABLET    Take 1 tablet (25 mg total) by mouth daily.   TRAMADOL (ULTRAM) 50 MG TABLET    Take 1 tablet (50 mg total) by mouth every 12 (twelve) hours as needed for moderate pain.   TURMERIC PO    Take 538 mg by mouth 2 (two) times a day.    VITAMIN B-12 (CYANOCOBALAMIN) 100 MCG TABLET    Take 200 mcg by mouth daily. 1000 x2    VITAMIN D PO    Take by mouth.  Modified Medications   No medications on file  Discontinued Medications   No medications on file    Physical Exam:  Vitals:  09/14/21 0925  BP: (!) 158/92  Pulse: 75  Temp: (!) 97.3 F (36.3 C)  SpO2: 98%  Weight: (!) 318 lb 6.4 oz (144.4 kg)  Height: 5\' 7"  (1.702 m)   Body mass index is 49.87 kg/m. Wt Readings from Last 3 Encounters:  09/14/21 (!) 318 lb 6.4 oz (144.4 kg)  06/20/21 (!) 319 lb 6.4 oz (144.9 kg)  04/11/21 300 lb (136.1 kg)    Physical Exam Vitals and nursing note reviewed.  Constitutional:      Appearance: She is obese.  HENT:     Right Ear: Tympanic membrane normal.     Left Ear: Tympanic membrane normal.  Cardiovascular:     Rate and Rhythm: Normal rate and regular rhythm.  Pulmonary:     Effort: Pulmonary effort is normal.     Breath sounds: Normal breath sounds.  Abdominal:     General: Abdomen is flat.     Palpations: Abdomen is soft.  Musculoskeletal:     Comments: Back: Minimal tenderness in the left costal vertebral area Straight leg raising is negative Deep tendon reflexes are symmetric but difficult to assess given her size    Neurological:     General: No focal deficit present.     Mental Status: She is alert and oriented to person, place, and time.  Psychiatric:        Mood and Affect: Mood normal.        Behavior: Behavior normal.    Labs reviewed: Basic Metabolic Panel: Recent Labs    11/30/20 1229 06/07/21 0746 06/20/21 1123  NA 139 141 138  K 3.2* 4.3 4.1  CL 105 102 107  CO2 28 22 25   GLUCOSE 112* 108* 99  BUN 8 9 13   CREATININE 0.93 1.05* 0.95  CALCIUM 8.9 9.3 9.1   Liver Function Tests: Recent Labs    11/30/20 1229 06/20/21 1123  AST 14 15  ALT 10 10  ALKPHOS 97  --   BILITOT 0.4 0.4  PROT 7.5 7.3  ALBUMIN 3.5  --    No results for input(s): LIPASE, AMYLASE in the last 8760 hours. No results for input(s): AMMONIA in the last 8760 hours. CBC: Recent Labs     11/30/20 1229 06/20/21 1123  WBC 9.3 8.8  NEUTROABS 7.2 6,274  HGB 12.6 13.8  HCT 38.1 43.4  MCV 92.0 89.9  PLT 195.0 216   Lipid Panel: Recent Labs    06/20/21 1123  CHOL 193  HDL 49*  LDLCALC 123*  TRIG 107  CHOLHDL 3.9   TSH: No results for input(s): TSH in the last 8760 hours. A1C: Lab Results  Component Value Date   HGBA1C 5.4 12/28/2016     Assessment/Plan  1. Morbid obesity (HCC)  - phentermine 37.5 MG capsule; Take 1 capsule (37.5 mg total) by mouth every morning.  Dispense: 30 capsule; Refill: 0  2. Benign essential HTN Blood pressure is fair at 158/92.  She uses losartan hydrochlorothiazide  3. COPD with chronic bronchitis Not using inhalers since she has not seen pulmonologist in some time.  I encouraged her to follow-up with pulmonology and get back on inhalers on a regular basis  4. Mixed hyperlipidemia She had labs done 3 months ago and LDL was not at goal.  She takes 40 mg of Crestor and LDL was 123.  She plans to follow-up with cardiology  5. Morbid obesity due to excess calories (HCC) Spent some time talking about restriction of carbs.  She is not  able to exercise due to her obesity and breathing difficulties.  She asked for refill of her phentermine and I reluctantly agreed but with the warning that its only meant to be used short-term to try to affect behaviors and eating less  6. Pulmonary sarcoidosis Seems to be doing okay but does need follow-up with pulmonology   Jacalyn Lefevre, MD [facility] [phone]

## 2021-09-14 NOTE — Patient Instructions (Signed)
Back Exercises °These exercises help to make your trunk and back strong. They also help to keep the lower back flexible. Doing these exercises can help to prevent or lessen pain in your lower back. °If you have back pain, try to do these exercises 2-3 times each day or as told by your doctor. °As you get better, do the exercises once each day. Repeat the exercises more often as told by your doctor. °To stop back pain from coming back, do the exercises once each day, or as told by your doctor. °Do exercises exactly as told by your doctor. Stop right away if you feel sudden pain or your pain gets worse. °Exercises °Single knee to chest °Do these steps 3-5 times in a row for each leg: °Lie on your back on a firm bed or the floor with your legs stretched out. °Bring one knee to your chest. °Grab your knee or thigh with both hands and hold it in place. °Pull on your knee until you feel a gentle stretch in your lower back or butt. °Keep doing the stretch for 10-30 seconds. °Slowly let go of your leg and straighten it. °Pelvic tilt °Do these steps 5-10 times in a row: °Lie on your back on a firm bed or the floor with your legs stretched out. °Bend your knees so they point up to the ceiling. Your feet should be flat on the floor. °Tighten your lower belly (abdomen) muscles to press your lower back against the floor. This will make your tailbone point up to the ceiling instead of pointing down to your feet or the floor. °Stay in this position for 5-10 seconds while you gently tighten your muscles and breathe evenly. °Cat-cow °Do these steps until your lower back bends more easily: °Get on your hands and knees on a firm bed or the floor. Keep your hands under your shoulders, and keep your knees under your hips. You may put padding under your knees. °Let your head hang down toward your chest. Tighten (contract) the muscles in your belly. Point your tailbone toward the floor so your lower back becomes rounded like the back of a  cat. °Stay in this position for 5 seconds. °Slowly lift your head. Let the muscles of your belly relax. Point your tailbone up toward the ceiling so your back forms a sagging arch like the back of a cow. °Stay in this position for 5 seconds. ° °Press-ups °Do these steps 5-10 times in a row: °Lie on your belly (face-down) on a firm bed or the floor. °Place your hands near your head, about shoulder-width apart. °While you keep your back relaxed and keep your hips on the floor, slowly straighten your arms to raise the top half of your body and lift your shoulders. Do not use your back muscles. You may change where you place your hands to make yourself more comfortable. °Stay in this position for 5 seconds. Keep your back relaxed. °Slowly return to lying flat on the floor. ° °Bridges °Do these steps 10 times in a row: °Lie on your back on a firm bed or the floor. °Bend your knees so they point up to the ceiling. Your feet should be flat on the floor. Your arms should be flat at your sides, next to your body. °Tighten your butt muscles and lift your butt off the floor until your waist is almost as high as your knees. If you do not feel the muscles working in your butt and the back of   your thighs, slide your feet 1-2 inches (2.5-5 cm) farther away from your butt. °Stay in this position for 3-5 seconds. °Slowly lower your butt to the floor, and let your butt muscles relax. °If this exercise is too easy, try doing it with your arms crossed over your chest. °Belly crunches °Do these steps 5-10 times in a row: °Lie on your back on a firm bed or the floor with your legs stretched out. °Bend your knees so they point up to the ceiling. Your feet should be flat on the floor. °Cross your arms over your chest. °Tip your chin a little bit toward your chest, but do not bend your neck. °Tighten your belly muscles and slowly raise your chest just enough to lift your shoulder blades a tiny bit off the floor. Avoid raising your body  higher than that because it can put too much stress on your lower back. °Slowly lower your chest and your head to the floor. °Back lifts °Do these steps 5-10 times in a row: °Lie on your belly (face-down) with your arms at your sides, and rest your forehead on the floor. °Tighten the muscles in your legs and your butt. °Slowly lift your chest off the floor while you keep your hips on the floor. Keep the back of your head in line with the curve in your back. Look at the floor while you do this. °Stay in this position for 3-5 seconds. °Slowly lower your chest and your face to the floor. °Contact a doctor if: °Your back pain gets a lot worse when you do an exercise. °Your back pain does not get better within 2 hours after you exercise. °If you have any of these problems, stop doing the exercises. Do not do them again unless your doctor says it is okay. °Get help right away if: °You have sudden, very bad back pain. If this happens, stop doing the exercises. Do not do them again unless your doctor says it is okay. °This information is not intended to replace advice given to you by your health care provider. Make sure you discuss any questions you have with your health care provider. °Document Revised: 12/29/2020 Document Reviewed: 12/29/2020 °Elsevier Patient Education © 2022 [employer] ° °

## 2021-11-14 ENCOUNTER — Ambulatory Visit: Payer: BLUE CROSS/BLUE SHIELD | Admitting: Nurse Practitioner

## 2021-11-19 ENCOUNTER — Other Ambulatory Visit: Payer: Self-pay | Admitting: Nurse Practitioner

## 2022-01-09 ENCOUNTER — Ambulatory Visit: Payer: Self-pay | Admitting: Nurse Practitioner

## 2022-02-06 ENCOUNTER — Ambulatory Visit: Payer: Self-pay | Admitting: Nurse Practitioner

## 2022-03-06 ENCOUNTER — Ambulatory Visit: Payer: Self-pay | Admitting: Nurse Practitioner

## 2022-03-21 ENCOUNTER — Other Ambulatory Visit: Payer: Self-pay | Admitting: Nurse Practitioner

## 2022-03-21 DIAGNOSIS — F3341 Major depressive disorder, recurrent, in partial remission: Secondary | ICD-10-CM

## 2022-03-22 ENCOUNTER — Telehealth: Payer: Self-pay | Admitting: Pulmonary Disease

## 2022-03-22 MED ORDER — DULERA 200-5 MCG/ACT IN AERO: 2.0000 | INHALATION_SPRAY | Freq: Two times a day (BID) | RESPIRATORY_TRACT | 7 refills | Status: DC

## 2022-03-22 MED ORDER — ALBUTEROL SULFATE HFA 108 (90 BASE) MCG/ACT IN AERS: 2.0000 | INHALATION_SPRAY | Freq: Four times a day (QID) | RESPIRATORY_TRACT | 5 refills | Status: DC | PRN

## 2022-03-22 MED ORDER — IPRATROPIUM-ALBUTEROL 0.5-2.5 (3) MG/3ML IN SOLN: 3.0000 mL | RESPIRATORY_TRACT | 5 refills | Status: DC | PRN

## 2022-03-22 NOTE — Telephone Encounter (Signed)
Called and spoke with patient who is requesting refills on some of her medications. Confirmed which meds and preferred pharmacy. Prescriptions have been sent. Let her know about shortage of Duoneb. She expressed understanding. Nothing further needed at this time.

## 2022-03-22 NOTE — Telephone Encounter (Signed)
Attempted to call pt but unable to reach. Unable to leave VM as mailbox was full. Will try to call back later. 

## 2022-03-25 ENCOUNTER — Other Ambulatory Visit: Payer: Self-pay | Admitting: Nurse Practitioner

## 2022-03-25 DIAGNOSIS — F3341 Major depressive disorder, recurrent, in partial remission: Secondary | ICD-10-CM

## 2022-04-03 ENCOUNTER — Encounter: Payer: Self-pay | Admitting: Nurse Practitioner

## 2022-04-03 ENCOUNTER — Ambulatory Visit (INDEPENDENT_AMBULATORY_CARE_PROVIDER_SITE_OTHER): Payer: 59 | Admitting: Nurse Practitioner

## 2022-04-03 VITALS — BP 140/92 | HR 67 | Temp 97.5°F | Ht 67.0 in | Wt 325.0 lb

## 2022-04-03 DIAGNOSIS — Z1231 Encounter for screening mammogram for malignant neoplasm of breast: Secondary | ICD-10-CM | POA: Diagnosis not present

## 2022-04-03 DIAGNOSIS — J339 Nasal polyp, unspecified: Secondary | ICD-10-CM | POA: Diagnosis not present

## 2022-04-03 DIAGNOSIS — Z114 Encounter for screening for human immunodeficiency virus [HIV]: Secondary | ICD-10-CM | POA: Diagnosis not present

## 2022-04-03 DIAGNOSIS — Z1211 Encounter for screening for malignant neoplasm of colon: Secondary | ICD-10-CM

## 2022-04-03 DIAGNOSIS — Z1212 Encounter for screening for malignant neoplasm of rectum: Secondary | ICD-10-CM

## 2022-04-03 DIAGNOSIS — F17200 Nicotine dependence, unspecified, uncomplicated: Secondary | ICD-10-CM

## 2022-04-03 DIAGNOSIS — Z0000 Encounter for general adult medical examination without abnormal findings: Secondary | ICD-10-CM

## 2022-04-03 MED ORDER — VARENICLINE TARTRATE 0.5 MG X 11 & 1 MG X 42 PO TBPK: ORAL_TABLET | ORAL | 0 refills | Status: DC

## 2022-04-03 NOTE — Progress Notes (Signed)
Provider: Sharon Seller, NP  Patient Care Team: Kaitlyn Seller, NP as PCP - General (Geriatric Medicine) Kaitlyn Riffle, MD as PCP - Cardiology (Cardiology)  Extended Emergency Contact Information Primary Emergency Contact: Kaitlyn [name] Address: [address]          [place], [place] [address] [place] Home Phone: [phone] Relation: Brother Secondary Emergency Contact: Kaitlyn [name] Address: [address]          [place], [place] [address] [place] Home Phone: [phone] Relation: Significant other Allergies  Allergen Reactions   Codeine Other (See Comments)    Reaction unknown   Cyclinex [Tetracycline Hcl] Other (See Comments)    Reaction unknown, all "cyclines"   Milk Thistle Hives   Morphine And Related Other (See Comments)    Reaction unknown   Code Status: full code  Goals of Care: Advanced Directive information    04/03/2022   11:45 AM  Advanced Directives  Does Patient Have a Medical Advance Directive? No  Would patient like information on creating a medical advance directive? No - Patient declined     Chief Complaint  Patient presents with   Annual Exam    Yearly physical. Discuss need for HIV screening, mammogram, colonoscopy, pap, shingrix, and additional covid boosters or post pone/exclude if patient refuses. Discuss restating phentermine. Fasting if any labs due. Discuss polyp in nose.     HPI: Patient is a 56 y.o. female seen in today for an wellness exam at [facility] She has dentures to the upper and still has dentition to her lower Eye doctor is Kaitlyn [name]- she has not seen recently  Plans to get COVID booster  Plans to see pulmonary doctor to follow up.   Reports she is sitting around and has started smoking again.       04/03/2022    1:22 PM 06/20/2021   10:32 AM 02/17/2019    3:51 PM 05/30/2018    8:55 AM 12/28/2016    9:14 AM  Depression screen PHQ 2/9  Decreased Interest 0 3 0 0 0  Down, Depressed,  Hopeless 0 3 0 0 0  PHQ - 2 Score 0 6 0 0 0  Altered sleeping  3     Tired, decreased energy  3     Change in appetite  3     Feeling bad or failure about yourself   1     Trouble concentrating  1     Moving slowly or fidgety/restless  0     Suicidal thoughts  0     PHQ-9 Score  17     Difficult doing work/chores  Very difficult          07/26/2020    9:27 AM 08/26/2020    8:34 AM 06/20/2021   10:31 AM 09/14/2021    9:31 AM 04/03/2022    1:22 PM  Fall Risk  Falls in the past year? 1 0 0 0 0  Was there an injury with Fall? 0 0 0 0 0  Fall Risk Category Calculator 1 0 0 0 0  Fall Risk Category Low Low Low Low Low  Patient Fall Risk Level Low fall risk Low fall risk Low fall risk Low fall risk Low fall risk  Patient at Risk for Falls Due to   No Fall Risks No Fall Risks No Fall Risks  Fall risk Follow up   Falls evaluation completed Falls evaluation completed;Education provided;Falls prevention discussed Falls evaluation completed  View : No data to display.           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   PFT 03/13/12>>FEV1 1.65 (57%), FEV1% 58, TLC 4.44 (79%), DLCO 62%, no BD    Depression    Extrinsic asthma, unspecified    Hidradenitis    Hyperlipidemia    takes pravachol   Hypertension    Keloid scar    Obesity    Osteoarthritis    Osteoarthrosis, unspecified whether generalized or localized, unspecified  site    PONV (postoperative nausea and vomiting)    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 bx. left intranasal papilloma   OTHER SURGICAL HISTORY  2010   cyst removal from right middle finger     REMOVAL OF GROWTH FROM FINGER     DR MEYERDIERKS   VIDEO BRONCHOSCOPY WITH ENDOBRONCHIAL ULTRASOUND  03/01/2012    Social History   Socioeconomic History   Marital status: Single    Spouse name: Not on file   Number of children: 0   Years of education: Not on file   Highest education level: Not on file  Occupational History   Occupation: CNA    Employer: [employer]  Tobacco Use   Smoking status: Some Days    Types: E-cigarettes   Smokeless tobacco: Never   Tobacco comments:    1-2 every couple of weeks.  Vaping Use   Vaping Use: Never used  Substance and Sexual Activity   Alcohol use: Yes    Comment: occasionally   Drug use: No   Sexual activity: Not on file  Other Topics Concern   Not on file  Social History Narrative   Not on file   Social Determinants of Health   Financial Resource Strain: Not on file  Food Insecurity: Not on file  Transportation Needs: Not on file  Physical Activity: Not on file  Stress: Not on file  Social Connections: Not on file    Family History  Problem Relation Age of Onset   Hyperlipidemia Mother    Stroke Mother    Arthritis Mother    Depression Mother    Heart disease Father    Hypertension Father    Cancer Father    Cancer  Maternal Aunt        mother's  aunt   Diabetes Maternal Aunt     Review of Systems:  Review of Systems  Constitutional:  Negative for chills, fever and weight loss.  HENT:  Negative for tinnitus.   Respiratory:  Positive for shortness of breath. Negative for cough and sputum production (with activity).   Cardiovascular:  Negative for chest pain, palpitations and leg swelling.  Gastrointestinal:  Negative for abdominal pain, constipation, diarrhea and heartburn.  Genitourinary:  Negative for dysuria, frequency and urgency.  Musculoskeletal:  Negative for back pain, falls, joint pain and myalgias.  Skin: Negative.   Neurological:  Negative for dizziness and headaches.  Psychiatric/Behavioral:  Negative for depression and memory loss. The patient does not have insomnia.     Allergies as of 04/03/2022       Reactions   Codeine Other (See Comments)   Reaction unknown   Cyclinex [tetracycline Hcl] Other (See Comments)   Reaction unknown, all "cyclines"   Milk Thistle Hives   Morphine And Related Other (See Comments)   Reaction unknown        Medication List        Accurate as of April 03, 2022  1:28 PM. If you have any questions, ask your nurse or doctor.          acetaminophen 500 MG tablet Commonly known as: TYLENOL Take 500 mg by mouth every 6 (six) hours as needed. For pain   albuterol 108 (90 Base) MCG/ACT inhaler Commonly known as: VENTOLIN HFA Inhale 2 puffs into the lungs every 6 (six) hours as needed for wheezing or shortness of breath.   aspirin EC 81 MG tablet Take 81 mg by mouth daily. Swallow whole.   buPROPion 150 MG 12 hr tablet Commonly known as: WELLBUTRIN SR TAKE 1 TABLET(150 MG) BY MOUTH TWICE DAILY   cephALEXin 500 MG capsule Commonly known as: KEFLEX Take 1,000 mg by mouth daily.   clindamycin 1 % lotion Commonly known as: CLEOCIN T Apply 1 application topically 2 (two) times daily as needed (affected areas).   diclofenac Sodium 1 %  Gel Commonly known as: VOLTAREN Apply 4 g topically as needed.   Dulera 200-5 MCG/ACT Aero Generic drug: mometasone-formoterol Inhale 2 puffs into the lungs 2 (two) times daily.   furosemide 40 MG tablet Commonly known as: LASIX Take one tablet 1-2 times per week as needed for swelling.   GLUCOSAMINE HCL PO Take 1 tablet by mouth 2 (two) times daily.   hydrocortisone 2.5 % cream Apply 1 application topically 2 (two) times daily as needed for rash.   ipratropium-albuterol 0.5-2.5 (3) MG/3ML Soln Commonly known as: DUONEB Take 3 mLs by nebulization every 4 (four) hours as needed (shortness of breath  (D86.0, J44.9)).   losartan-hydrochlorothiazide 100-12.5 MG tablet Commonly known as: HYZAAR TAKE 1 TABLET BY MOUTH DAILY   montelukast 10 MG tablet Commonly known as: SINGULAIR TAKE 1 TABLET(10 MG) BY MOUTH AT BEDTIME   omega-3 fish oil 1000 MG Caps capsule Commonly known as: MAXEPA Take by mouth.   phentermine 37.5 MG capsule Take 1 capsule (37.5 mg total) by mouth every morning.   potassium chloride SA 20 MEQ tablet Commonly known as: KLOR-CON M Take one tablet 1-2 times per week as needed.  Take with Lasix.   rosuvastatin 40 MG tablet Commonly known as: CRESTOR Take 1 tablet (40 mg total) by mouth daily.   sertraline 25 MG tablet Commonly known as: ZOLOFT Take 1 tablet (25 mg total) by mouth daily.   traMADol 50  MG tablet Commonly known as: ULTRAM Take 1 tablet (50 mg total) by mouth every 12 (twelve) hours as needed for moderate pain.   TURMERIC PO Take 538 mg by mouth 2 (two) times a day.   vitamin B-12 100 MCG tablet Commonly known as: CYANOCOBALAMIN Take 200 mcg by mouth daily. 1000 x2   VITAMIN D PO Take by mouth.          Physical Exam: Vitals:   04/03/22 1320  BP: (!) 140/92  Pulse: 67  Temp: (!) 97.5 F (36.4 C)  SpO2: 99%  Weight: (!) 325 lb (147.4 kg)  Height: 5\' 7"  (1.702 m)   Body mass index is 50.9 kg/m. Wt Readings from  Last 3 Encounters:  04/03/22 (!) 325 lb (147.4 kg)  09/14/21 (!) 318 lb 6.4 oz (144.4 kg)  06/20/21 (!) 319 lb 6.4 oz (144.9 kg)    Physical Exam Constitutional:      General: She is not in acute distress.    Appearance: She is well-developed. She is obese. She is not diaphoretic.  HENT:     Head: Normocephalic and atraumatic.     Mouth/Throat:     Pharynx: No oropharyngeal exudate.  Eyes:     Conjunctiva/sclera: Conjunctivae normal.     Pupils: Pupils are equal, round, and reactive to light.  Cardiovascular:     Rate and Rhythm: Normal rate and regular rhythm.     Heart sounds: Normal heart sounds.  Pulmonary:     Effort: Pulmonary effort is normal.     Breath sounds: Normal breath sounds.  Abdominal:     General: Bowel sounds are normal.     Palpations: Abdomen is soft.  Musculoskeletal:     Cervical back: Normal range of motion and neck supple.     Right lower leg: No edema.     Left lower leg: No edema.  Skin:    General: Skin is warm and dry.  Neurological:     Mental Status: She is alert and oriented to person, place, and time. Mental status is at baseline.  Psychiatric:        Mood and Affect: Mood normal.    Labs reviewed: Basic Metabolic Panel: Recent Labs    06/07/21 0746 06/20/21 1123  NA 141 138  K 4.3 4.1  CL 102 107  CO2 22 25  GLUCOSE 108* 99  BUN 9 13  CREATININE 1.05* 0.95  CALCIUM 9.3 9.1   Liver Function Tests: Recent Labs    06/20/21 1123  AST 15  ALT 10  BILITOT 0.4  PROT 7.3   No results for input(s): LIPASE, AMYLASE in the last 8760 hours. No results for input(s): AMMONIA in the last 8760 hours. CBC: Recent Labs    06/20/21 1123  WBC 8.8  NEUTROABS 6,274  HGB 13.8  HCT 43.4  MCV 89.9  PLT 216   Lipid Panel: Recent Labs    06/20/21 1123  CHOL 193  HDL 49*  LDLCALC 123*  TRIG 107  CHOLHDL 3.9   Lab Results  Component Value Date   HGBA1C 5.4 12/28/2016    Procedures: No results found.  Assessment/Plan 1.  Breast cancer screening by mammogram - MM Digital Screening; Future  2. Screening for colorectal cancer - Cologuard  3. Left nasal polyps - Ambulatory referral to ENT  4. Screening for HIV (human immunodeficiency virus) - HIV Antibody (routine testing w rflx)  5. Preventative health care - The patient was counseled regarding the appropriate use of  alcohol, regular self-examination of the breasts on a monthly basis, prevention of dental and periodontal disease, diet, regular sustained exercise for at least 30 minutes 5 times per week, routine screening interval for mammogram as recommended by the American Cancer Society and ACOG, importance of regular PAP smears,smoking cessation, tobacco use,  and recommended schedule for GI hemoccult testing, colonoscopy, cholesterol, thyroid and diabetes screening. Discussed the importance of weight loss.  - Ambulatory referral to Gynecology- does not wish to get PAP/breast exam in office today - Lipid panel - CMP with eGFR([facility]) - CBC with Differential/Platelet -discussed with patient to make a list of task for health prevention that she is due for and then mark them off once complete.   6. Smoker -discussed long term effects of smoking with her. She is already having shortness of breath with exertion.  Discussed chantix, willing to start to try to quit smoking.  - varenicline (CHANTIX PAK) 0.5 MG X 11 & 1 MG X 42 tablet; Take one 0.5 mg tablet by mouth once daily for 3 days, then increase to one 0.5 mg tablet twice daily for 4 days, then increase to one 1 mg tablet twice daily.  Dispense: 53 tablet; Refill: 0 ]  7. Morbid obesity due to excess calories (HCC) -education provided on healthy weight loss through increase in physical activity and proper nutrition. Discussed ways to increase exercise and meal prep.  Next appt: 06/05/2022 Kaitlyn [name]. Biagio Borg  [facility] [phone]

## 2022-04-04 LAB — COMPLETE METABOLIC PANEL WITH GFR
AG Ratio: 0.9 (calc) — ABNORMAL LOW (ref 1.0–2.5)
ALT: 10 U/L (ref 6–29)
AST: 16 U/L (ref 10–35)
Albumin: 3.5 g/dL — ABNORMAL LOW (ref 3.6–5.1)
Alkaline phosphatase (APISO): 107 U/L (ref 37–153)
BUN: 13 mg/dL (ref 7–25)
CO2: 29 mmol/L (ref 20–32)
Calcium: 9.1 mg/dL (ref 8.6–10.4)
Chloride: 104 mmol/L (ref 98–110)
Creat: 0.98 mg/dL (ref 0.50–1.03)
Globulin: 3.8 g/dL (calc) — ABNORMAL HIGH (ref 1.9–3.7)
Glucose, Bld: 82 mg/dL (ref 65–99)
Potassium: 4.1 mmol/L (ref 3.5–5.3)
Sodium: 140 mmol/L (ref 135–146)
Total Bilirubin: 0.6 mg/dL (ref 0.2–1.2)
Total Protein: 7.3 g/dL (ref 6.1–8.1)
eGFR: 67 mL/min/{1.73_m2} (ref >=60)

## 2022-04-04 LAB — LIPID PANEL
Cholesterol: 149 mg/dL (ref <200)
HDL: 55 mg/dL (ref >=50)
LDL Cholesterol (Calc): 79 mg/dL (calc)
Non-HDL Cholesterol (Calc): 94 mg/dL (calc) (ref <130)
Total CHOL/HDL Ratio: 2.7 (calc) (ref <5.0)
Triglycerides: 70 mg/dL (ref <150)

## 2022-04-04 LAB — CBC WITH DIFFERENTIAL/PLATELET
Absolute Monocytes: 679 cells/uL (ref 200–950)
Basophils Absolute: 39 cells/uL (ref 0–200)
Basophils Relative: 0.4 %
Eosinophils Absolute: 97 cells/uL (ref 15–500)
Eosinophils Relative: 1 %
HCT: 42.5 % (ref 35.0–45.0)
Hemoglobin: 13.9 g/dL (ref 11.7–15.5)
Lymphs Abs: 2047 cells/uL (ref 850–3900)
MCH: 30.2 pg (ref 27.0–33.0)
MCHC: 32.7 g/dL (ref 32.0–36.0)
MCV: 92.4 fL (ref 80.0–100.0)
MPV: 11.7 fL (ref 7.5–12.5)
Monocytes Relative: 7 %
Neutro Abs: 6839 cells/uL (ref 1500–7800)
Neutrophils Relative %: 70.5 %
Platelets: 210 10*3/uL (ref 140–400)
RBC: 4.6 10*6/uL (ref 3.80–5.10)
RDW: 13.4 % (ref 11.0–15.0)
Total Lymphocyte: 21.1 %
WBC: 9.7 10*3/uL (ref 3.8–10.8)

## 2022-04-04 LAB — HIV ANTIBODY (ROUTINE TESTING W REFLEX): HIV 1&2 Ab, 4th Generation: NONREACTIVE

## 2022-04-17 ENCOUNTER — Ambulatory Visit: Payer: BLUE CROSS/BLUE SHIELD

## 2022-04-18 ENCOUNTER — Ambulatory Visit (INDEPENDENT_AMBULATORY_CARE_PROVIDER_SITE_OTHER): Payer: 59 | Admitting: Pulmonary Disease

## 2022-04-18 ENCOUNTER — Encounter: Payer: Self-pay | Admitting: Pulmonary Disease

## 2022-04-18 VITALS — BP 130/72 | HR 79 | Temp 98.6°F | Ht 67.0 in | Wt 327.0 lb

## 2022-04-18 DIAGNOSIS — R490 Dysphonia: Secondary | ICD-10-CM

## 2022-04-18 DIAGNOSIS — J339 Nasal polyp, unspecified: Secondary | ICD-10-CM | POA: Diagnosis not present

## 2022-04-18 DIAGNOSIS — D869 Sarcoidosis, unspecified: Secondary | ICD-10-CM

## 2022-04-18 MED ORDER — PANTOPRAZOLE SODIUM 40 MG PO TBEC: 40.0000 mg | DELAYED_RELEASE_TABLET | Freq: Every day | ORAL | 1 refills | Status: DC

## 2022-04-18 MED ORDER — BREZTRI AEROSPHERE 160-9-4.8 MCG/ACT IN AERO: 2.0000 | INHALATION_SPRAY | Freq: Two times a day (BID) | RESPIRATORY_TRACT | 5 refills | Status: DC

## 2022-04-18 MED ORDER — FLUTICASONE PROPIONATE 50 MCG/ACT NA SUSP: 1.0000 | Freq: Every day | NASAL | 5 refills | Status: DC

## 2022-04-18 MED ORDER — BREZTRI AEROSPHERE 160-9-4.8 MCG/ACT IN AERO: 2.0000 | INHALATION_SPRAY | Freq: Two times a day (BID) | RESPIRATORY_TRACT | 0 refills | Status: DC

## 2022-04-18 NOTE — Progress Notes (Signed)
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 Pt states she is having some issues with her breathing. She states she has gained weight since last visit. Is needing refills on her plum peds.     Constitutional:  BP 130/72 (BP Location: Left Arm, Patient Position: Sitting, Cuff Size: Normal)   Pulse 79   Temp 98.6 F (37 C) (Oral)   Ht 5\' 7"  (1.702 m)   Wt (!) 327 lb (148.3 kg)   SpO2 96%   BMI 51.22 kg/m   Past Medical History:  OA, Keloid, HTN, HLD, Hidradenitis, Depression, Carpal tunnel, Anxiety  Past Surgical History:  She  has a past surgical history that includes Other surgical history (2010); Multiple tooth extractions (2010); REMOVAL OF GROWTH FROM FINGER; Video bronchoscopy with endobronchial ultrasound (03/01/2012); Open reduction nasal fracture (06/29/2009); and Nasal turbinate reduction (Bilateral, 06/29/2009).  Brief Summary:  Kaitlyn [name] is a 56 y.o. female smoker with pulmonary sarcoidosis, and COPD.      Subjective:   She moved into a new house.  Her brother smokes in the house.  She started smoking again also.  She was recently prescribed chantix.  She has sinus congestion and post nasal drip.  She has developed a polyp in her nose.  She is getting wheeze from her throat and chest.  She has a globus sensation and has to clear her throat.  She has cough with clear sputum.  She gets winded while walking more easily than before.  No skin rash.  She has more reflux symptoms.  She has some snoring at night, but her boyfriend hasn't said anything more about her breathing at night.  Physical Exam:   Appearance - well kempt   ENMT - no sinus tenderness, no oral exudate, no LAN, Mallampati 3 airway, no stridor, wears dentures, wheeze from neck clears with purse lip breathing, small polyp in Lt nares   Respiratory - equal breath sounds bilaterally, no  wheezing or rales  CV - s1s2 regular rate and rhythm, no murmurs  Ext - no clubbing, no edema  Skin - no rashes  Psych - normal mood and affect   Pulmonary testing:  EBUS with Tbx 03/01/12 >> Streptococcus pneumoniae in BAL, cytology/Tbx negative PFT 03/13/12>>FEV1 1.65 (57%), FEV1% 58, TLC 4.44 (79%), DLCO 62%, no BD PFT 03/18/13 >> FEV1 1.78 (69%), FEV1% 67, TLC 4.04 (75%), DLCO 92%, no BD PFT 01/13/14 >> FEV1 1.64 (70%), FEV1% 70, TLC 3.69 (68%), DLCO 79%  Chest Imaging:  CT chest 01/23/12 >> bulky mediastinal/hilar LAN.  Scattered ill-defined upper lobe predominate perilymphatic and centrilobular nodules. CT chest 04/30/12 >> No change in the appearance of the mediastinal and hilar adenopathy and pulmonary nodularity CT chest 07/31/12 >> b/l hilar adenopathy no change, b/l upper lobe subpleural nodularity, improved LUL GGO CT chest 04/14/13 >> peribronchovascular nodularity b/l upper lobes Rt > Lt mildly progressed, no change to mediastinal/hilar adenopathy CT chest 01/05/14 >> upper lobe predominant prebronchovascular reticulo-nodule opacities, 1.2 cm Rt paratracheal LAN, 1.6 cm subcarinal LAN, 1.7 cm Rt hilar LAN  CT chest 10/07/14 >> decreased LAN CT chest 04/26/16 >> atherosclerosis, borderline LAN, patchy nodularity b/l  HRCT chest 12/16/19 >> atherosclerosis, mild patchy air trapping b/l, diffuse irregular thickening of interstitium with mild/mod central BTX and architectural distortion w/o change   Social History:  She  reports that she has been  smoking e-cigarettes. She has never used smokeless tobacco. She reports current alcohol use. She reports that she does not use drugs.  Family History:  Her family history includes Arthritis in her mother; Cancer in her father and maternal aunt; Depression in her mother; Diabetes in her maternal aunt; Heart disease in her father; Hyperlipidemia in her mother; Hypertension in her father; Stroke in her mother.     Discussion:  She has  progressive dyspnea.  This could be related to COPD/asthma, progression of sarcoidosis, upper airway cough and reflux with throat irritation, and/or obesity with deconditioning.  Assessment/Plan:   Pulmonary sarcoidosis. - has been on methotrexate since 2014 >> stopped in December 2022 - will arrange for follow up high resolution CT chest and PFT   COPD with chronic bronchitis and asthma. - change from dulera to breztri - continue singulair - prn albuterol, duoneb  Upper airway cough syndrome with rhinitis and post nasal drip. Nasal polyp. - add flonase - continue singulair - referral to ENT - voice rest, sip water, salt water gargles, prn 1 tsp honey  Laryngopharyngeal reflux. - trial of protonix 40 mg daily  Tobacco abuse. - prescribed chantix from her PCP  Obesity. - discussed importance of weight loss  Time Spent Involved in Patient Care on Day of Examination:  38 minutes  Follow up:   Patient Instructions  Flonase 1 spray in each nostril daily. Sip water when you have the urge to cough. Avoid forcing a cough or clearing your throat. Salt water gargle twice per day. 1 teaspoon local honey as needed to help with cough. Breztri two puffs in the morning and two puffs in the evening, and rinse your mouth after each use. Protonix 40 mg pill daily 30 minutes before your first meal of the day. Will arrange for pulmonary function test and CT chest. Will arrange for referral to ENT. Follow up in 6 weeks.  Medication List:   Allergies as of 04/18/2022       Reactions   Codeine Other (See Comments)   Reaction unknown   Cyclinex [tetracycline Hcl] Other (See Comments)   Reaction unknown, all "cyclines"   Milk Thistle Hives   Morphine And Related Other (See Comments)   Reaction unknown        Medication List        Accurate as of April 18, 2022  4:00 PM. If you have any questions, ask your nurse or doctor.          STOP taking these medications    Dulera  200-5 MCG/ACT Aero Generic drug: mometasone-formoterol Stopped by: [name], MD       TAKE these medications    acetaminophen 500 MG tablet Commonly known as: TYLENOL Take 500 mg by mouth every 6 (six) hours as needed. For pain   albuterol 108 (90 Base) MCG/ACT inhaler Commonly known as: VENTOLIN HFA Inhale 2 puffs into the lungs every 6 (six) hours as needed for wheezing or shortness of breath.   aspirin EC 81 MG tablet Take 81 mg by mouth daily. Swallow whole.   Breztri Aerosphere 160-9-4.8 MCG/ACT Aero Generic drug: Budeson-Glycopyrrol-Formoterol Inhale 2 puffs into the lungs in the morning and at bedtime. Started by: [name], MD   Jerrye Bushy [phone] MCG/ACT Aero Generic drug: Budeson-Glycopyrrol-Formoterol Inhale 2 puffs into the lungs in the morning and at bedtime. Started by: [name], MD   buPROPion 150 MG 12 hr tablet Commonly known as: WELLBUTRIN SR TAKE 1 TABLET(150 MG) BY MOUTH  TWICE DAILY   calcium carbonate 1250 (500 Ca) MG tablet Commonly known as: OS-CAL - dosed in mg of elemental calcium Take 1 tablet by mouth.   cephALEXin 500 MG capsule Commonly known as: KEFLEX Take 1,000 mg by mouth daily.   clindamycin 1 % lotion Commonly known as: CLEOCIN T Apply 1 application  topically daily.   diclofenac Sodium 1 % Gel Commonly known as: VOLTAREN Apply 4 g topically as needed.   fluticasone 50 MCG/ACT nasal spray Commonly known as: FLONASE Place 1 spray into both nostrils daily. Started by: [name], MD   furosemide 40 MG tablet Commonly known as: LASIX Take one tablet 1-2 times per week as needed for swelling.   GLUCOSAMINE HCL PO Take 1 tablet by mouth 2 (two) times daily.   hydrocortisone 2.5 % cream Apply 1 application topically 2 (two) times daily as needed for rash.   ipratropium-albuterol 0.5-2.5 (3) MG/3ML Soln Commonly known as: DUONEB Take 3 mLs by nebulization every 4 (four) hours as needed (shortness of  breath  (D86.0, J44.9)).   losartan-hydrochlorothiazide 100-12.5 MG tablet Commonly known as: HYZAAR TAKE 1 TABLET BY MOUTH DAILY   metFORMIN 500 MG (MOD) 24 hr tablet Commonly known as: GLUMETZA Take 500 mg by mouth daily with breakfast.   montelukast 10 MG tablet Commonly known as: SINGULAIR TAKE 1 TABLET(10 MG) BY MOUTH AT BEDTIME   omega-3 fish oil 1000 MG Caps capsule Commonly known as: MAXEPA Take by mouth.   pantoprazole 40 MG tablet Commonly known as: Protonix Take 1 tablet (40 mg total) by mouth daily. Started by: [name], MD   phentermine 37.5 MG capsule Take 1 capsule (37.5 mg total) by mouth every morning.   potassium chloride SA 20 MEQ tablet Commonly known as: KLOR-CON M Take one tablet 1-2 times per week as needed.  Take with Lasix.   rosuvastatin 40 MG tablet Commonly known as: CRESTOR Take 1 tablet (40 mg total) by mouth daily.   sertraline 25 MG tablet Commonly known as: ZOLOFT Take 1 tablet (25 mg total) by mouth daily.   traMADol 50 MG tablet Commonly known as: ULTRAM Take 1 tablet (50 mg total) by mouth every 12 (twelve) hours as needed for moderate pain.   TURMERIC PO Take 538 mg by mouth 2 (two) times a day.   varenicline 0.5 MG X 11 & 1 MG X 42 tablet Commonly known as: CHANTIX PAK Take one 0.5 mg tablet by mouth once daily for 3 days, then increase to one 0.5 mg tablet twice daily for 4 days, then increase to one 1 mg tablet twice daily.   vitamin B-12 100 MCG tablet Commonly known as: CYANOCOBALAMIN Take 200 mcg by mouth daily. 1000 x2   VITAMIN D PO Take by mouth.        Signature:  [name], MD [facility] Pager - [phone] 04/18/2022, 4:00 PM

## 2022-04-18 NOTE — Patient Instructions (Signed)
Flonase 1 spray in each nostril daily. Sip water when you have the urge to cough. Avoid forcing a cough or clearing your throat. Salt water gargle twice per day. 1 teaspoon local honey as needed to help with cough. Breztri two puffs in the morning and two puffs in the evening, and rinse your mouth after each use. Protonix 40 mg pill daily 30 minutes before your first meal of the day. Will arrange for pulmonary function test and CT chest. Will arrange for referral to ENT. Follow up in 6 weeks.

## 2022-04-19 ENCOUNTER — Ambulatory Visit: Payer: BLUE CROSS/BLUE SHIELD | Admitting: Pulmonary Disease

## 2022-05-12 LAB — COLOGUARD: COLOGUARD: NEGATIVE

## 2022-05-15 ENCOUNTER — Ambulatory Visit (HOSPITAL_COMMUNITY): Payer: 59

## 2022-05-30 ENCOUNTER — Other Ambulatory Visit: Payer: Self-pay | Admitting: Nurse Practitioner

## 2022-05-30 ENCOUNTER — Ambulatory Visit (HOSPITAL_COMMUNITY): Payer: 59

## 2022-05-30 DIAGNOSIS — F17200 Nicotine dependence, unspecified, uncomplicated: Secondary | ICD-10-CM

## 2022-05-30 NOTE — Telephone Encounter (Signed)
Please advise if this is a long term medication. RX was approved in June 2023 with no refills

## 2022-06-05 ENCOUNTER — Encounter: Payer: 59 | Admitting: Nurse Practitioner

## 2022-06-05 ENCOUNTER — Encounter: Payer: Self-pay | Admitting: Nurse Practitioner

## 2022-06-06 NOTE — Progress Notes (Signed)
Err

## 2022-06-12 ENCOUNTER — Ambulatory Visit (HOSPITAL_COMMUNITY): Admission: RE | Admit: 2022-06-12 | Payer: 59 | Source: Ambulatory Visit

## 2022-06-19 ENCOUNTER — Ambulatory Visit: Payer: 59 | Admitting: Pulmonary Disease

## 2022-06-29 ENCOUNTER — Other Ambulatory Visit: Payer: Self-pay | Admitting: Nurse Practitioner

## 2022-06-29 DIAGNOSIS — F321 Major depressive disorder, single episode, moderate: Secondary | ICD-10-CM

## 2022-07-06 ENCOUNTER — Other Ambulatory Visit: Payer: Self-pay | Admitting: *Deleted

## 2022-07-06 MED ORDER — PANTOPRAZOLE SODIUM 40 MG PO TBEC: 40.0000 mg | DELAYED_RELEASE_TABLET | Freq: Every day | ORAL | 3 refills | Status: DC

## 2022-07-17 ENCOUNTER — Other Ambulatory Visit: Payer: Self-pay

## 2022-07-17 DIAGNOSIS — F3341 Major depressive disorder, recurrent, in partial remission: Secondary | ICD-10-CM

## 2022-07-17 MED ORDER — BUPROPION HCL ER (SR) 150 MG PO TB12: ORAL_TABLET | ORAL | 0 refills | Status: DC

## 2022-07-17 NOTE — Telephone Encounter (Signed)
Patient called requesting refill on medication. Medication was prescribed 03/21/2022 with no refills.   Medication pended and sent to Sherrie Mustache, NP

## 2022-07-28 ENCOUNTER — Telehealth: Payer: Self-pay | Admitting: Pulmonary Disease

## 2022-07-28 NOTE — Telephone Encounter (Signed)
Patient needs to schedule CT scan prior to her appointment on 10/24. Please advise.

## 2022-07-28 NOTE — Telephone Encounter (Signed)
Ct scheduled pt aware 

## 2022-08-14 ENCOUNTER — Ambulatory Visit
Admission: RE | Admit: 2022-08-14 | Discharge: 2022-08-14 | Disposition: A | Discharge status: Home / Self Care | Payer: Commercial Managed Care - HMO | Source: Ambulatory Visit | Attending: Pulmonary Disease | Admitting: Pulmonary Disease

## 2022-08-14 DIAGNOSIS — D869 Sarcoidosis, unspecified: Secondary | ICD-10-CM

## 2022-08-15 ENCOUNTER — Ambulatory Visit (HOSPITAL_COMMUNITY): Payer: Commercial Managed Care - HMO

## 2022-08-21 ENCOUNTER — Other Ambulatory Visit: Payer: Self-pay | Admitting: Nurse Practitioner

## 2022-08-21 ENCOUNTER — Ambulatory Visit: Payer: 59 | Admitting: Nurse Practitioner

## 2022-08-21 DIAGNOSIS — F3341 Major depressive disorder, recurrent, in partial remission: Secondary | ICD-10-CM

## 2022-08-22 ENCOUNTER — Ambulatory Visit: Payer: Commercial Managed Care - HMO | Admitting: Pulmonary Disease

## 2022-08-22 ENCOUNTER — Ambulatory Visit (INDEPENDENT_AMBULATORY_CARE_PROVIDER_SITE_OTHER): Payer: Commercial Managed Care - HMO | Admitting: Pulmonary Disease

## 2022-08-22 ENCOUNTER — Encounter: Payer: Self-pay | Admitting: Pulmonary Disease

## 2022-08-22 VITALS — BP 140/82 | HR 60 | Temp 98.2°F | Ht 66.0 in | Wt 325.0 lb

## 2022-08-22 DIAGNOSIS — J339 Nasal polyp, unspecified: Secondary | ICD-10-CM | POA: Diagnosis not present

## 2022-08-22 DIAGNOSIS — J4489 Other specified chronic obstructive pulmonary disease: Secondary | ICD-10-CM | POA: Diagnosis not present

## 2022-08-22 DIAGNOSIS — D869 Sarcoidosis, unspecified: Secondary | ICD-10-CM | POA: Diagnosis not present

## 2022-08-22 LAB — PULMONARY FUNCTION TEST
DL/VA % pred: 124 %
DL/VA: 5.22 ml/min/mmHg/L
DLCO cor % pred: 98 %
DLCO cor: 21.47 ml/min/mmHg
DLCO unc % pred: 98 %
DLCO unc: 21.47 ml/min/mmHg
FEF 25-75 Post: 0.85 L/sec
FEF 25-75 Pre: 0.8 L/sec
FEF2575-%Change-Post: 6 %
FEF2575-%Pred-Post: 32 %
FEF2575-%Pred-Pre: 30 %
FEV1-%Change-Post: 1 %
FEV1-%Pred-Post: 47 %
FEV1-%Pred-Pre: 47 %
FEV1-Post: 1.35 L
FEV1-Pre: 1.33 L
FEV1FVC-%Change-Post: 0 %
FEV1FVC-%Pred-Pre: 81 %
FEV6-%Change-Post: 2 %
FEV6-%Pred-Post: 60 %
FEV6-%Pred-Pre: 58 %
FEV6-Post: 2.11 L
FEV6-Pre: 2.06 L
FEV6FVC-%Pred-Post: 103 %
FEV6FVC-%Pred-Pre: 103 %
FVC-%Change-Post: 2 %
FVC-%Pred-Post: 58 %
FVC-%Pred-Pre: 56 %
FVC-Post: 2.11 L
FVC-Pre: 2.06 L
Post FEV1/FVC ratio: 64 %
Post FEV6/FVC ratio: 100 %
Pre FEV1/FVC ratio: 65 %
Pre FEV6/FVC Ratio: 100 %
RV % pred: 150 %
RV: 3.06 L
TLC % pred: 109 %
TLC: 5.87 L

## 2022-08-22 NOTE — Progress Notes (Signed)
Full PFT performed today. °

## 2022-08-22 NOTE — Patient Instructions (Signed)
Full PFT performed today. °

## 2022-08-22 NOTE — Patient Instructions (Signed)
Flu shot today  Will arrange for referral to ENT  Follow up in 6 months

## 2022-08-22 NOTE — Progress Notes (Signed)
[facility]  Chief Complaint  Patient presents with   Follow-up    COPD and sarcoidosis.  Breathing doing well.  Discuss CT and PFT today.    Constitutional:  BP (!) 140/82 (BP Location: Right Arm, Patient Position: Sitting, Cuff Size: Large)   Pulse 60   Temp 98.2 F (36.8 C) (Oral)   Ht 5\' 6"  (1.676 m)   Wt (!) 325 lb (147.4 kg)   SpO2 98%   BMI 52.46 kg/m   Past Medical History:  OA, Keloid, HTN, HLD, Hidradenitis, Depression, Carpal tunnel, Anxiety  Past Surgical History:  She  has a past surgical history that includes Other surgical history (2010); Multiple tooth extractions (2010); REMOVAL OF GROWTH FROM FINGER; Video bronchoscopy with endobronchial ultrasound (03/01/2012); Open reduction nasal fracture (06/29/2009); and Nasal turbinate reduction (Bilateral, 06/29/2009).  Brief Summary:  Kaitlyn [name] is a 56 y.o. female smoker with pulmonary sarcoidosis, and COPD.      Subjective:   Her PFT showed progression of obstruction.  Her lung volumes and diffusion capacity are improved.  Her CT chest showed stable chronic changes from sarcoidosis.  She is trying to vap to get off tobacco cigarettes.  Breztri helps. She is still having sinus congestion and still feels like she has a nasal polyp.  She wasn't able to schedule ENT visit from last appointment.  Physical Exam:   Appearance - well kempt   ENMT - no sinus tenderness, no oral exudate, no LAN, Mallampati 3 airway, no stridor, wears dentures  Respiratory - decreased breath sounds bilaterally, no wheezing or rales  CV - s1s2 regular rate and rhythm, no murmurs  Ext - no clubbing, no edema  Skin - no rashes  Psych - normal mood and affect    Pulmonary testing:  EBUS with Tbx 03/01/12 >> Streptococcus pneumoniae in BAL, cytology/Tbx negative PFT 03/13/12>>FEV1 1.65 (57%), FEV1% 58, TLC 4.44 (79%), DLCO 62%, no BD PFT 03/18/13 >> FEV1 1.78 (69%), FEV1% 67, TLC 4.04  (75%), DLCO 92%, no BD PFT 01/13/14 >> FEV1 1.64 (70%), FEV1% 70, TLC 3.69 (68%), DLCO 79% PFT 08/22/22 >> FEV1 1.35 (47%), FEV1% 64, TLC 5.87 (109%), RV 3.06 (150%), DLCO 98%  Chest Imaging:  CT chest 01/23/12 >> bulky mediastinal/hilar LAN.  Scattered ill-defined upper lobe predominate perilymphatic and centrilobular nodules. CT chest 04/30/12 >> No change in the appearance of the mediastinal and hilar adenopathy and pulmonary nodularity CT chest 07/31/12 >> b/l hilar adenopathy no change, b/l upper lobe subpleural nodularity, improved LUL GGO CT chest 04/14/13 >> peribronchovascular nodularity b/l upper lobes Rt > Lt mildly progressed, no change to mediastinal/hilar adenopathy CT chest 01/05/14 >> upper lobe predominant prebronchovascular reticulo-nodule opacities, 1.2 cm Rt paratracheal LAN, 1.6 cm subcarinal LAN, 1.7 cm Rt hilar LAN  CT chest 10/07/14 >> decreased LAN CT chest 04/26/16 >> atherosclerosis, borderline LAN, patchy nodularity b/l  HRCT chest 12/16/19 >> atherosclerosis, mild patchy air trapping b/l, diffuse irregular thickening of interstitium with mild/mod central BTX and architectural distortion w/o change HRCT chest 08/15/22 >> widespread thickening of the peribronchovascular interstitium with some patchy peribronchovascular ground-glass attenuation, most evident throughout the mid to upper lungs bilaterally. Scattered areas of very subtle fissural micro nodularity are noted.  Mild air trapping.   Social History:  She  reports that she has been smoking e-cigarettes. She has never used smokeless tobacco. She reports current alcohol use. She reports that she does not use drugs.  Family History:  Her family  history includes Arthritis in her mother; Cancer in her father and maternal aunt; Depression in her mother; Diabetes in her maternal aunt; Heart disease in her father; Hyperlipidemia in her mother; Hypertension in her father; Stroke in her mother.     Discussion:  I think most  of her respiratory symptoms at this time are related to COPD and obesity with deconditioning, rather that progression of sarcoidosis.  Assessment/Plan:   COPD with chronic bronchitis and asthma. - improved since switching to breztri; will continue this - continue singulair - prn albuterol, duoneb - flu shot today - she will need pneumonia 20 vaccine in 2024  Pulmonary sarcoidosis. - has been on methotrexate since 2014 >> stopped in December 2022 - monitor clinically   Upper airway cough syndrome with rhinitis and post nasal drip. Nasal polyp. - continue singulair, flonase - will send referral again for ENT to assess nasal polyps - voice rest as needed, sip water, salt water gargles prn , prn 1 tsp honey  Laryngopharyngeal reflux. - prn protonix  Tobacco abuse. - reviewed options to help with smoking cessation  Obesity. - discussed importance of weight loss  Time Spent Involved in Patient Care on Day of Examination:  39 minutes  Follow up:   Patient Instructions  Flu shot today  Will arrange for referral to ENT  Follow up in 6 months  Medication List:   Allergies as of 08/22/2022       Reactions   Codeine Other (See Comments)   Reaction unknown   Cyclinex [tetracycline Hcl] Other (See Comments)   Reaction unknown, all "cyclines"   Milk Thistle Hives   Morphine And Related Other (See Comments)   Reaction unknown        Medication List        Accurate as of August 22, 2022 12:21 PM. If you have any questions, ask your nurse or doctor.          acetaminophen 500 MG tablet Commonly known as: TYLENOL Take 500 mg by mouth every 6 (six) hours as needed. For pain   albuterol 108 (90 Base) MCG/ACT inhaler Commonly known as: VENTOLIN HFA Inhale 2 puffs into the lungs every 6 (six) hours as needed for wheezing or shortness of breath.   aspirin EC 81 MG tablet Take 81 mg by mouth daily. Swallow whole.   Breztri Aerosphere 160-9-4.8 MCG/ACT  Aero Generic drug: Budeson-Glycopyrrol-Formoterol Inhale 2 puffs into the lungs in the morning and at bedtime.   Breztri Aerosphere 160-9-4.8 MCG/ACT Aero Generic drug: Budeson-Glycopyrrol-Formoterol Inhale 2 puffs into the lungs in the morning and at bedtime.   buPROPion 150 MG 12 hr tablet Commonly known as: WELLBUTRIN SR TAKE 1 TABLET(150 MG) BY MOUTH TWICE DAILY   calcium carbonate 1250 (500 Ca) MG tablet Commonly known as: OS-CAL - dosed in mg of elemental calcium Take 1 tablet by mouth.   cephALEXin 500 MG capsule Commonly known as: KEFLEX Take 1,000 mg by mouth daily.   clindamycin 1 % lotion Commonly known as: CLEOCIN T Apply 1 application  topically daily.   diclofenac Sodium 1 % Gel Commonly known as: VOLTAREN Apply 4 g topically as needed.   fluticasone 50 MCG/ACT nasal spray Commonly known as: FLONASE Place 1 spray into both nostrils daily.   furosemide 40 MG tablet Commonly known as: LASIX Take one tablet 1-2 times per week as needed for swelling.   GLUCOSAMINE HCL PO Take 1 tablet by mouth 2 (two) times daily.   hydrocortisone 2.5 %  cream Apply 1 application topically 2 (two) times daily as needed for rash.   ipratropium-albuterol 0.5-2.5 (3) MG/3ML Soln Commonly known as: DUONEB Take 3 mLs by nebulization every 4 (four) hours as needed (shortness of breath  (D86.0, J44.9)).   losartan-hydrochlorothiazide 100-12.5 MG tablet Commonly known as: HYZAAR TAKE 1 TABLET BY MOUTH DAILY   metFORMIN 500 MG (MOD) 24 hr tablet Commonly known as: GLUMETZA Take 500 mg by mouth daily with breakfast.   montelukast 10 MG tablet Commonly known as: SINGULAIR TAKE 1 TABLET(10 MG) BY MOUTH AT BEDTIME   omega-3 fish oil 1000 MG Caps capsule Commonly known as: MAXEPA Take by mouth.   pantoprazole 40 MG tablet Commonly known as: Protonix Take 1 tablet (40 mg total) by mouth daily.   phentermine 37.5 MG capsule Take 1 capsule (37.5 mg total) by mouth every  morning.   potassium chloride SA 20 MEQ tablet Commonly known as: KLOR-CON M Take one tablet 1-2 times per week as needed.  Take with Lasix.   rosuvastatin 40 MG tablet Commonly known as: CRESTOR Take 1 tablet (40 mg total) by mouth daily.   sertraline 25 MG tablet Commonly known as: ZOLOFT TAKE 1 TABLET(25 MG) BY MOUTH DAILY   traMADol 50 MG tablet Commonly known as: ULTRAM Take 1 tablet (50 mg total) by mouth every 12 (twelve) hours as needed for moderate pain.   TURMERIC PO Take 538 mg by mouth 2 (two) times a day.   varenicline 0.5 MG X 11 & 1 MG X 42 tablet Commonly known as: CHANTIX PAK FOLLOW PACKAGE DIRECTIONS.   vitamin B-12 100 MCG tablet Commonly known as: CYANOCOBALAMIN Take 200 mcg by mouth daily. 1000 x2   VITAMIN D PO Take by mouth.        Signature:  Chesley Mires, MD [place] Pager - [phone] 08/22/2022, 12:21 PM

## 2022-08-25 ENCOUNTER — Ambulatory Visit: Payer: 59

## 2022-08-30 ENCOUNTER — Telehealth: Payer: Self-pay | Admitting: Pulmonary Disease

## 2022-09-01 NOTE — Telephone Encounter (Signed)
Called and spoke with pt letting her know the info per VS and she verbalized understanding. Pt said she would call her insurance company to see which ENT offices were in network with her insurance and then would call us back.  Leaving encounter open to wait for pt to call back.

## 2022-09-01 NOTE — Telephone Encounter (Signed)
Please find out which ENT group is in network with her insurance and send referral there instead.

## 2022-09-04 ENCOUNTER — Encounter: Payer: Self-pay | Admitting: Nurse Practitioner

## 2022-09-04 ENCOUNTER — Ambulatory Visit (INDEPENDENT_AMBULATORY_CARE_PROVIDER_SITE_OTHER): Payer: Commercial Managed Care - HMO | Admitting: Nurse Practitioner

## 2022-09-04 ENCOUNTER — Other Ambulatory Visit: Payer: Self-pay | Admitting: Nurse Practitioner

## 2022-09-04 VITALS — BP 132/82 | HR 77 | Temp 97.3°F | Ht 66.0 in | Wt 323.0 lb

## 2022-09-04 DIAGNOSIS — F3341 Major depressive disorder, recurrent, in partial remission: Secondary | ICD-10-CM

## 2022-09-04 DIAGNOSIS — J4489 Other specified chronic obstructive pulmonary disease: Secondary | ICD-10-CM

## 2022-09-04 DIAGNOSIS — I10 Essential (primary) hypertension: Secondary | ICD-10-CM

## 2022-09-04 DIAGNOSIS — F17200 Nicotine dependence, unspecified, uncomplicated: Secondary | ICD-10-CM

## 2022-09-04 DIAGNOSIS — E782 Mixed hyperlipidemia: Secondary | ICD-10-CM

## 2022-09-04 DIAGNOSIS — F321 Major depressive disorder, single episode, moderate: Secondary | ICD-10-CM

## 2022-09-04 LAB — COMPLETE METABOLIC PANEL WITH GFR
AG Ratio: 0.9 (calc) — ABNORMAL LOW (ref 1.0–2.5)
ALT: 12 U/L (ref 6–29)
AST: 20 U/L (ref 10–35)
Albumin: 3.7 g/dL (ref 3.6–5.1)
Alkaline phosphatase (APISO): 104 U/L (ref 37–153)
BUN: 15 mg/dL (ref 7–25)
CO2: 25 mmol/L (ref 20–32)
Calcium: 9.2 mg/dL (ref 8.6–10.4)
Chloride: 102 mmol/L (ref 98–110)
Creat: 0.97 mg/dL (ref 0.50–1.03)
Globulin: 4 g/dL (calc) — ABNORMAL HIGH (ref 1.9–3.7)
Glucose, Bld: 81 mg/dL (ref 65–139)
Potassium: 5 mmol/L (ref 3.5–5.3)
Sodium: 137 mmol/L (ref 135–146)
Total Bilirubin: 0.5 mg/dL (ref 0.2–1.2)
Total Protein: 7.7 g/dL (ref 6.1–8.1)
eGFR: 68 mL/min/{1.73_m2} (ref >=60)

## 2022-09-04 LAB — CBC WITH DIFFERENTIAL/PLATELET
Absolute Monocytes: 740 cells/uL (ref 200–950)
Basophils Absolute: 30 cells/uL (ref 0–200)
Basophils Relative: 0.3 %
Eosinophils Absolute: 60 cells/uL (ref 15–500)
Eosinophils Relative: 0.6 %
HCT: 40.9 % (ref 35.0–45.0)
Hemoglobin: 13.6 g/dL (ref 11.7–15.5)
Lymphs Abs: 2240 cells/uL (ref 850–3900)
MCH: 30.3 pg (ref 27.0–33.0)
MCHC: 33.3 g/dL (ref 32.0–36.0)
MCV: 91.1 fL (ref 80.0–100.0)
MPV: 12.1 fL (ref 7.5–12.5)
Monocytes Relative: 7.4 %
Neutro Abs: 6930 cells/uL (ref 1500–7800)
Neutrophils Relative %: 69.3 %
Platelets: 218 10*3/uL (ref 140–400)
RBC: 4.49 10*6/uL (ref 3.80–5.10)
RDW: 13.4 % (ref 11.0–15.0)
Total Lymphocyte: 22.4 %
WBC: 10 10*3/uL (ref 3.8–10.8)

## 2022-09-04 MED ORDER — SERTRALINE HCL 25 MG PO TABS: 25.0000 mg | ORAL_TABLET | Freq: Every day | ORAL | 1 refills | Status: DC

## 2022-09-04 MED ORDER — BUPROPION HCL ER (SR) 150 MG PO TB12: 150.0000 mg | ORAL_TABLET | Freq: Two times a day (BID) | ORAL | 2 refills | Status: DC

## 2022-09-04 MED ORDER — VARENICLINE TARTRATE (STARTER) 0.5 MG X 11 & 1 MG X 42 PO TBPK: ORAL_TABLET | ORAL | 0 refills | Status: DC

## 2022-09-04 NOTE — Patient Instructions (Signed)
Referral sent to [facility] they will contact patient with appointment.   [facility]   Address: [address], [place], [place] [address]   Phone: [phone]

## 2022-09-04 NOTE — Progress Notes (Signed)
Careteam: Patient Care Team: Lauree Chandler, NP as PCP - General (Geriatric Medicine) Fay Records, MD as PCP - Cardiology (Cardiology)  PLACE OF SERVICE:  [place]  Advanced Directive information    Allergies  Allergen Reactions  . Codeine Other (See Comments)    Reaction unknown  . Cyclinex [Tetracycline Hcl] Other (See Comments)    Reaction unknown, all "cyclines"  . Milk Thistle Hives  . Morphine And Related Other (See Comments)    Reaction unknown    Chief Complaint  Patient presents with  . Medical Management of Chronic Issues    Patient presents today for a 5 month follow-up  . Quality Metric Gaps    Mammogram, colonoscopy, pap smear, zoster, COVID#3     HPI: Patient is a 56 y.o. female for routine follow up.   She continues to smoke, tries to quit but brother smokes and they live together.  She is followed by pulmonary. She has tried chantix twice and did not help but want to try it again.   Mammogram scheduled She was given GYN referral but did not get msg.  Will give number to schedule.   She got ENT referral- working on insurance to cover.   Got flu shot.   She is taking metformin from Dr Delman Cheadle due to hidradenitis     Review of Systems:  ROS***  Past Medical History:  Diagnosis Date  . Anxiety   . Asthma    inhalers   . Carpal tunnel syndrome 01/2015   right  . 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 Depression   . Extrinsic asthma, unspecified   . Hidradenitis   . Hyperlipidemia    takes pravachol  . Hypertension   . Keloid scar   . Obesity   . Osteoarthritis   . Osteoarthrosis, unspecified whether generalized or localized, unspecified site   . PONV (postoperative nausea and vomiting)   . 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 Sarcoidosis   . Shortness of breath    exertion  . Wears dentures    top   Past Surgical History:  Procedure Laterality Date  . MULTIPLE TOOTH EXTRACTIONS  2010  . NASAL TURBINATE REDUCTION Bilateral 06/29/2009   inferior  . OPEN REDUCTION NASAL FRACTURE  06/29/2009   with exc. bx. left intranasal papilloma  . OTHER SURGICAL HISTORY  2010   cyst removal from right middle finger    . REMOVAL OF GROWTH FROM FINGER     DR Almira Bar  . VIDEO BRONCHOSCOPY WITH ENDOBRONCHIAL ULTRASOUND  03/01/2012   Social History:   reports that she has been smoking e-cigarettes. She has never used smokeless tobacco. She reports current alcohol use. She reports that she does not use drugs.  Family History  Problem Relation Age of Onset  . Hyperlipidemia Mother   . Stroke Mother   . Arthritis Mother   . Depression Mother   . Heart disease Father   . Hypertension Father   . Cancer Father   . Cancer Maternal Aunt        mother's aunt  . Diabetes Maternal Aunt     Medications: Patient's Medications  New Prescriptions   No medications on file  Previous Medications  ACETAMINOPHEN (TYLENOL) 500 MG TABLET    Take 500 mg by mouth every 6 (six) hours as needed. For pain   ALBUTEROL (VENTOLIN HFA) 108 (90 BASE) MCG/ACT INHALER    Inhale 2 puffs into the lungs every 6 (six) hours as needed for wheezing or shortness of breath.   ASPIRIN EC 81 MG TABLET    Take 81 mg by mouth daily. Swallow whole.   BUDESON-GLYCOPYRROL-FORMOTEROL (BREZTRI AEROSPHERE) 160-9-4.8 MCG/ACT AERO    Inhale 2 puffs into the lungs in the morning and at bedtime.   BUDESON-GLYCOPYRROL-FORMOTEROL (BREZTRI AEROSPHERE) 160-9-4.8 MCG/ACT AERO    Inhale 2 puffs into the lungs in the morning and at bedtime.   BUPROPION (WELLBUTRIN SR) 150 MG 12 HR TABLET    TAKE 1 TABLET(150 MG) BY MOUTH TWICE DAILY   CALCIUM CARBONATE (OS-CAL - DOSED IN MG OF  ELEMENTAL CALCIUM) 1250 (500 CA) MG TABLET    Take 1 tablet by mouth.   CEPHALEXIN (KEFLEX) 500 MG CAPSULE    Take 1,000 mg by mouth daily.   CLINDAMYCIN (CLEOCIN T) 1 % LOTION    Apply 1 application  topically daily.   DICLOFENAC SODIUM (VOLTAREN) 1 % GEL    Apply 4 g topically as needed.   FLUTICASONE (FLONASE) 50 MCG/ACT NASAL SPRAY    Place 1 spray into both nostrils daily.   FUROSEMIDE (LASIX) 40 MG TABLET    Take one tablet 1-2 times per week as needed for swelling.   GLUCOSAMINE HCL PO    Take 1 tablet by mouth 2 (two) times daily.   HYDROCORTISONE 2.5 % CREAM    Apply 1 application topically 2 (two) times daily as needed for rash.   IPRATROPIUM-ALBUTEROL (DUONEB) 0.5-2.5 (3) MG/3ML SOLN    Take 3 mLs by nebulization every 4 (four) hours as needed (shortness of breath  (D86.0, J44.9)).   LOSARTAN-HYDROCHLOROTHIAZIDE (HYZAAR) 100-12.5 MG TABLET    TAKE 1 TABLET BY MOUTH DAILY   METFORMIN (GLUCOPHAGE-XR) 750 MG 24 HR TABLET    Take 750 mg by mouth daily.   MONTELUKAST (SINGULAIR) 10 MG TABLET    TAKE 1 TABLET(10 MG) BY MOUTH AT BEDTIME   OMEGA-3 FISH OIL (MAXEPA) 1000 MG CAPS CAPSULE    Take by mouth.   PANTOPRAZOLE (PROTONIX) 40 MG TABLET    Take 1 tablet (40 mg total) by mouth daily.   PHENTERMINE 37.5 MG CAPSULE    Take 1 capsule (37.5 mg total) by mouth every morning.   POTASSIUM CHLORIDE SA (KLOR-CON) 20 MEQ TABLET    Take one tablet 1-2 times per week as needed.  Take with Lasix.   ROSUVASTATIN (CRESTOR) 40 MG TABLET    Take 1 tablet (40 mg total) by mouth daily.   SERTRALINE (ZOLOFT) 25 MG TABLET    TAKE 1 TABLET(25 MG) BY MOUTH DAILY   TRAMADOL (ULTRAM) 50 MG TABLET    Take 1 tablet (50 mg total) by mouth every 12 (twelve) hours as needed for moderate pain.   TURMERIC PO    Take 538 mg by mouth 2 (two) times a day.    VARENICLINE (CHANTIX PAK) 0.5 MG X 11 & 1 MG X 42 TABLET    FOLLOW PACKAGE DIRECTIONS.   VITAMIN B-12 (CYANOCOBALAMIN) 100 MCG TABLET    Take 200 mcg by mouth daily.  1000 x2   VITAMIN D PO    Take by mouth.  Modified Medications   No medications on file  Discontinued Medications   METFORMIN (GLUMETZA) 500 MG (  MOD) 24 HR TABLET    Take 500 mg by mouth daily with breakfast.    Physical Exam:  Vitals:   09/04/22 1056  BP: 132/82  Pulse: 77  Temp: (!) 97.3 F (36.3 C)  SpO2: 98%  Weight: (!) 323 lb (146.5 kg)  Height: 5\' 6"  (1.676 m)   Body mass index is 52.13 kg/m. Wt Readings from Last 3 Encounters:  09/04/22 (!) 323 lb (146.5 kg)  08/22/22 (!) 325 lb (147.4 kg)  04/18/22 (!) 327 lb (148.3 kg)    Physical Exam***  Labs reviewed: Basic Metabolic Panel: Recent Labs    04/03/22 1410  NA 140  K 4.1  CL 104  CO2 29  GLUCOSE 82  BUN 13  CREATININE 0.98  CALCIUM 9.1   Liver Function Tests: Recent Labs    04/03/22 1410  AST 16  ALT 10  BILITOT 0.6  PROT 7.3   No results for input(s): "LIPASE", "AMYLASE" in the last 8760 hours. No results for input(s): "AMMONIA" in the last 8760 hours. CBC: Recent Labs    04/03/22 1410  WBC 9.7  NEUTROABS 6,839  HGB 13.9  HCT 42.5  MCV 92.4  PLT 210   Lipid Panel: Recent Labs    04/03/22 1410  CHOL 149  HDL 55  LDLCALC 79  TRIG 70  CHOLHDL 2.7   TSH: No results for input(s): "TSH" in the last 8760 hours. A1C: Lab Results  Component Value Date   HGBA1C 5.4 12/28/2016     Assessment/Plan 1. Benign essential HTN *** - COMPLETE METABOLIC PANEL WITH GFR - CBC with Differential/Platelet  2. Recurrent major depressive disorder, in partial remission (HCC) *** - buPROPion (WELLBUTRIN SR) 150 MG 12 hr tablet; Take 1 tablet (150 mg total) by mouth 2 (two) times daily.  Dispense: 60 tablet; Refill: 2  3. Depression, major, single episode, moderate (HCC) *** - sertraline (ZOLOFT) 25 MG tablet; Take 1 tablet (25 mg total) by mouth daily.  Dispense: 90 tablet; Refill: 1  4. Smoker *** - Varenicline Tartrate, Starter, (CHANTIX STARTING MONTH PAK) 0.5 MG X 11 & 1 MG X 42  TBPK; Use as directed  Dispense: 1 each; Refill: 0  5. Morbid obesity (HCC) ***  6. COPD with chronic bronchitis ***  7. Mixed hyperlipidemia ***   No follow-ups on file.: *** Teagyn Fishel K. [place], [facility] [phone]

## 2022-09-07 ENCOUNTER — Telehealth: Payer: Self-pay | Admitting: Pulmonary Disease

## 2022-09-07 NOTE — Telephone Encounter (Signed)
Fax received from pt's pharmacy stating that the Kaitlyn [name] is not covered by pt's insurance. Preferred alternatives include: Anoro, Arnuity, Flovent Diskus, or Flovent HFA. Dr. Craige Cotta, please advise on this.

## 2022-09-08 NOTE — Telephone Encounter (Signed)
Received a call from pt's insurance stating that they were going to send over a PA to the office on pt's medication.

## 2022-09-11 ENCOUNTER — Other Ambulatory Visit (HOSPITAL_COMMUNITY): Payer: Self-pay

## 2022-09-11 NOTE — Telephone Encounter (Signed)
Please proceed with PA because anoro, arnuity, and flovent are not equivalent inhalers to breztri without needing to combine several inhalers instead of the 1 inhaler in breztri.

## 2022-09-11 NOTE — Telephone Encounter (Signed)
PA has been submitted to [employer] through [facility] for [place].  PA is awaiting determination.   Key: [id number] - PA Case ID: [id number]

## 2022-09-12 MED ORDER — ARNUITY ELLIPTA 100 MCG/ACT IN AEPB: 1.0000 | INHALATION_SPRAY | Freq: Every day | RESPIRATORY_TRACT | 3 refills | Status: DC

## 2022-09-12 MED ORDER — ANORO ELLIPTA 62.5-25 MCG/ACT IN AEPB: 1.0000 | INHALATION_SPRAY | Freq: Every day | RESPIRATORY_TRACT | 3 refills | Status: DC

## 2022-09-12 NOTE — Telephone Encounter (Signed)
PA for Kaitlyn [name] has been DENIED due to:   There is no indication your patient has met both of the following: 1. Failure/inadequate response  with Anoro Ellipta therapy (meaning patient requires add-on therapy with inhaled corticosteroids);  2. Inability to use Anora Ellipta in combination with each preferred inhaled corticosteroid therapy*.  *budesonide, Arnuity Ellipta, Flovent Diskus/HFA, and Asmanex/HFA**. **Prior authorization or  step therapy may apply

## 2022-09-12 NOTE — Telephone Encounter (Signed)
Please let her know her insurance provider will not cover breztri unless she has tried and failed their approved inhalers first.  Please send script for anoro one puff daily, and arnuity 100 one puff daily.

## 2022-09-12 NOTE — Telephone Encounter (Signed)
ATC patient.  LMTCB. 

## 2022-09-12 NOTE — Telephone Encounter (Signed)
Called and notified patient and she voiced understanding. Nothing further needed at this time.   

## 2022-09-27 ENCOUNTER — Encounter: Payer: Self-pay | Admitting: Internal Medicine

## 2022-09-27 ENCOUNTER — Ambulatory Visit: Payer: Commercial Managed Care - HMO | Attending: Internal Medicine

## 2022-09-27 ENCOUNTER — Ambulatory Visit: Payer: Commercial Managed Care - HMO | Attending: Internal Medicine | Admitting: Internal Medicine

## 2022-09-27 ENCOUNTER — Other Ambulatory Visit: Payer: Self-pay | Admitting: Internal Medicine

## 2022-09-27 VITALS — BP 142/76 | HR 74 | Ht 67.0 in | Wt 318.8 lb

## 2022-09-27 DIAGNOSIS — E782 Mixed hyperlipidemia: Secondary | ICD-10-CM | POA: Diagnosis not present

## 2022-09-27 DIAGNOSIS — Z79899 Other long term (current) drug therapy: Secondary | ICD-10-CM

## 2022-09-27 DIAGNOSIS — R0609 Other forms of dyspnea: Secondary | ICD-10-CM

## 2022-09-27 DIAGNOSIS — R42 Dizziness and giddiness: Secondary | ICD-10-CM

## 2022-09-27 NOTE — Patient Instructions (Signed)
Medication Instructions:   *If you need a refill on your cardiac medications before your next appointment, please call your pharmacy*   Lab Work:  If you have labs (blood work) drawn today and your tests are completely normal, you will receive your results only by: MyChart Message (if you have MyChart) OR A paper copy in the mail If you have any lab test that is abnormal or we need to change your treatment, we will call you to review the results.   Testing/Procedures: Your physician has requested that you have an echocardiogram. Echocardiography is a painless test that uses sound waves to create images of your heart. It provides your doctor with information about the size and shape of your heart and how well your heart's chambers and valves are working. This procedure takes approximately one hour. There are no restrictions for this procedure. Please do NOT wear cologne, perfume, aftershave, or lotions (deodorant is allowed). Please arrive 15 minutes prior to your appointment time.   ZIO XT- Long Term Monitor Instructions  Your physician has requested you wear a ZIO patch monitor for 7 days.  This is a single patch monitor. [employer] supplies one patch monitor per enrollment. Additional stickers are not available. Please do not apply patch if you will be having a Nuclear Stress Test,  Echocardiogram, Cardiac CT, MRI, or Chest Xray during the period you would be wearing the  monitor. The patch cannot be worn during these tests. You cannot remove and re-apply the  ZIO XT patch monitor.  Your ZIO patch monitor will be mailed 3 day USPS to your address on file. It may take 3-5 days  to receive your monitor after you have been enrolled.  Once you have received your monitor, please review the enclosed instructions. Your monitor  has already been registered assigning a specific monitor serial # to you.  Billing and Patient Assistance Program Information  We have supplied [employer] with any of  your insurance information on file for billing purposes. [employer] offers a sliding scale Patient Assistance Program for patients that do not have  insurance, or whose insurance does not completely cover the cost of the ZIO monitor.  You must apply for the Patient Assistance Program to qualify for this discounted rate.  To apply, please call [employer] at [phone], select option 4, select option 2, ask to apply for  Patient Assistance Program. Meredeth Ide will ask your household income, and how many people  are in your household. They will quote your out-of-pocket cost based on that information.  [employer] will also be able to set up a 19-month, interest-free payment plan if needed.  Applying the monitor   Shave hair from upper left chest.  Hold abrader disc by orange tab. Rub abrader in 40 strokes over the upper left chest as  indicated in your monitor instructions.  Clean area with 4 enclosed alcohol pads. Let dry.  Apply patch as indicated in monitor instructions. Patch will be placed under collarbone on left  side of chest with arrow pointing upward.  Rub patch adhesive wings for 2 minutes. Remove white label marked "1". Remove the white  label marked "2". Rub patch adhesive wings for 2 additional minutes.  While looking in a mirror, press and release button in center of patch. A small green light will  flash 3-4 times. This will be your only indicator that the monitor has been turned on.  Do not shower for the first 24 hours. You may shower after the first 24  hours.  Press the button if you feel a symptom. You will hear a small click. Record Date, Time and  Symptom in the Patient Logbook.  When you are ready to remove the patch, follow instructions on the last 2 pages of Patient  Logbook. Stick patch monitor onto the last page of Patient Logbook.  Place Patient Logbook in the blue and white box. Use locking tab on box and tape box closed  securely. The blue and white box has prepaid postage  on it. Please place it in the mailbox as  soon as possible. Your physician should have your test results approximately 7 days after the  monitor has been mailed back to [facility].  Call [facility] Customer Care at [phone] if you have questions regarding  your ZIO XT patch monitor. Call them immediately if you see an orange light blinking on your  monitor.  If your monitor falls off in less than 4 days, contact our Monitor department at [phone].  If your monitor becomes loose or falls off after 4 days call [employer] at [phone] for  suggestions on securing your monitor    Follow-Up: At [facility], you and your health needs are our priority.  As part of our continuing mission to provide you with exceptional heart care, we have created designated Provider Care Teams.  These Care Teams include your primary Cardiologist (physician) and Advanced Practice Providers (APPs -  Physician Assistants and Nurse Practitioners) who all work together to provide you with the care you need, when you need it.  We recommend signing up for the patient portal called "MyChart".  Sign up information is provided on this After Visit Summary.  MyChart is used to connect with patients for Virtual Visits (Telemedicine).  Patients are able to view lab/test results, encounter notes, upcoming appointments, etc.  Non-urgent messages can be sent to your provider as well.   To learn more about what you can do with MyChart, go to [email or web address].     Important Information About Sugar

## 2022-09-27 NOTE — Progress Notes (Unsigned)
Enrolled for [employer] to mail a ZIO XT long term holter monitor to the patients address on file.  

## 2022-09-27 NOTE — Progress Notes (Signed)
Cardiology Office Note   Date:  09/27/2022   ID:  Kaitlyn [name], DOB [birth year], MRN [id number]  PCP:  Sharon Seller, NP  Cardiologist:   Dietrich Pates, MD   Patient presents for follow up of HTN      History of Present Illness: Kaitlyn [name] is a 56 y.o. female with a history of HTN, asthma, tobacco abuse, COPD, Sarcoid  She is followed by Clayton Lefort in pulmonary    I saw her in 2020 and then virtually in 2022  The pt denies CP   Breathing is not good   Gets SOB with activity   The pt continues to smoke about 5 cigs per day.and exposed to second hand smoke at home     Patient also notes some dizziness with standing   No syncope   Sits before getting up from laying, gets up slowly       DId not take BP meds today    Current Meds  Medication Sig   acetaminophen (TYLENOL) 500 MG tablet Take 500 mg by mouth every 6 (six) hours as needed. For pain   albuterol (VENTOLIN HFA) 108 (90 Base) MCG/ACT inhaler Inhale 2 puffs into the lungs every 6 (six) hours as needed for wheezing or shortness of breath.   aspirin EC 81 MG tablet Take 81 mg by mouth daily. Swallow whole.   buPROPion (WELLBUTRIN SR) 150 MG 12 hr tablet Take 1 tablet (150 mg total) by mouth 2 (two) times daily.   calcium carbonate (OS-CAL - DOSED IN MG OF ELEMENTAL CALCIUM) 1250 (500 Ca) MG tablet Take 1 tablet by mouth.   cephALEXin (KEFLEX) 500 MG capsule Take 1,000 mg by mouth daily.   clindamycin (CLEOCIN T) 1 % lotion Apply 1 application  topically daily.   diclofenac Sodium (VOLTAREN) 1 % GEL Apply 4 g topically as needed.   fluticasone (FLONASE) 50 MCG/ACT nasal spray Place 1 spray into both nostrils daily.   Fluticasone Furoate (ARNUITY ELLIPTA) 100 MCG/ACT AEPB Inhale 1 puff into the lungs daily.   furosemide (LASIX) 40 MG tablet Take one tablet 1-2 times per week as needed for swelling.   GLUCOSAMINE HCL PO Take 1 tablet by mouth 2 (two) times daily.   hydrocortisone 2.5 % cream Apply 1 application topically 2  (two) times daily as needed for rash.   ipratropium-albuterol (DUONEB) 0.5-2.5 (3) MG/3ML SOLN Take 3 mLs by nebulization every 4 (four) hours as needed (shortness of breath  (D86.0, J44.9)).   losartan-hydrochlorothiazide (HYZAAR) 100-12.5 MG tablet TAKE 1 TABLET BY MOUTH DAILY   metFORMIN (GLUCOPHAGE-XR) 750 MG 24 hr tablet Take 750 mg by mouth daily.   montelukast (SINGULAIR) 10 MG tablet TAKE 1 TABLET(10 MG) BY MOUTH AT BEDTIME   omega-3 fish oil (MAXEPA) 1000 MG CAPS capsule Take by mouth.   pantoprazole (PROTONIX) 40 MG tablet Take 1 tablet (40 mg total) by mouth daily.   potassium chloride SA (KLOR-CON) 20 MEQ tablet Take one tablet 1-2 times per week as needed.  Take with Lasix.   rosuvastatin (CRESTOR) 40 MG tablet Take 1 tablet (40 mg total) by mouth daily.   sertraline (ZOLOFT) 25 MG tablet Take 1 tablet (25 mg total) by mouth daily.   traMADol (ULTRAM) 50 MG tablet Take 1 tablet (50 mg total) by mouth every 12 (twelve) hours as needed for moderate pain.   TURMERIC PO Take 538 mg by mouth 2 (two) times a day.    umeclidinium-vilanterol (ANORO ELLIPTA) 62.5-25  MCG/ACT AEPB Inhale 1 puff into the lungs daily.   Varenicline Tartrate, Starter, (CHANTIX STARTING MONTH PAK) 0.5 MG X 11 & 1 MG X 42 TBPK Use as directed   vitamin B-12 (CYANOCOBALAMIN) 100 MCG tablet Take 200 mcg by mouth daily. 1000 x2   VITAMIN D PO Take by mouth.     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   PFT 03/13/12>>FEV1 1.65 (57%), FEV1% 58, TLC 4.44 (79%), DLCO 62%, no BD    Depression    Extrinsic asthma, unspecified    Hidradenitis    Hyperlipidemia    takes pravachol   Hypertension    Keloid scar    Obesity    Osteoarthritis    Osteoarthrosis, unspecified whether generalized or localized,  unspecified site    PONV (postoperative nausea and vomiting)    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 bx. left intranasal papilloma   OTHER SURGICAL HISTORY  2010   cyst removal from right middle finger     REMOVAL OF GROWTH FROM FINGER     DR MEYERDIERKS   VIDEO BRONCHOSCOPY WITH ENDOBRONCHIAL ULTRASOUND  03/01/2012     Social History:  The patient  reports that she has been smoking e-cigarettes. She has never used smokeless tobacco. She reports current alcohol use. She reports that she does not use drugs.   Family History:  The patient's family history includes Arthritis in her mother; Cancer in her father and maternal aunt; Depression in her mother; Diabetes in her maternal aunt; Heart disease in her father; Hyperlipidemia in her mother; Hypertension in her father; Stroke in her mother.    ROS:  Please see the history of present illness. All other systems are reviewed and  Negative to the above problem except as noted.    PHYSICAL EXAM: VS:  BP (!) 142/76   Pulse 74   Ht 5\' 7"  (1.702 m)   Wt (!) 318 lb 12.8 oz (144.6 kg)   SpO2 97%   BMI 49.93 kg/m  \ BP laying 150/87  P 58   Sitting   158/90  P 61`   Standing 156/94  P 84    standing 3 min 178/94  P 74    GEN: Obese 56 yo , in no acute distress  HEENT: normal  Neck: no JVD, carotid bruits Cardiac: RRR; no murmurs  No LE edema  Respiratory:  SOme decreased airflow     Mild upper airway wheeze   GI: soft, nontender, obese , + BS  No hepatomegaly  MS:  no deformity Moving all extremities   Skin: warm and dry, no rash Neuro:  Strength and sensation are intact Psych: euthymic mood, full affect   EKG:  EKG is ordered today.  SR 74 bpm  with occasional PVCs   LBBB   Lipid Panel    Component Value Date/Time   CHOL 149 04/03/2022 1410   CHOL 167 06/09/2019 1417   TRIG 70 04/03/2022 1410   HDL 55 04/03/2022 1410   HDL 59 06/09/2019 1417   CHOLHDL 2.7 04/03/2022 1410   VLDL 19 12/28/2016 1013   LDLCALC 79 04/03/2022 1410      Wt Readings from Last 3 Encounters:  09/27/22 (!) 318 lb 12.8 oz (144.6 kg)  09/04/22 (!) 323 lb (146.5 kg)  08/22/22 (!) 325 lb (147.4 kg)      ASSESSMENT AND PLAN:  1   HTN   BP is high today  Did not take meds this morning    Need to follow closely    In early November was 132/82    2   Dizziness    Orthostatics are negative today Pt with new LBBB   Hx sarcoid    Recomm:  7 Day Monitor to evaluate for HB    Echo to start INcrease fluids     3  LBBB Will review echo   Consider myoview vs pet CT  4  Sarcoid  Pt followed in pulmonary clinic     5  Tobacco use  Counselled on cessation  6  Lipids   Pt on Crestor  Has atherosclerosis on CT   LDL 79   Follow for now       7   Obesity/metabolics   Pt drinks Coke daily   Discussed sugar, carbs    Needs to cut back    Pt interested in PIVIO    Will contact her if gets hrough legal      Current medicines are reviewed at length with the patient today.  The patient does not have concerns regarding medicines.  Signed, Dietrich Pates, MD  09/27/2022 9:32 AM    [facility] [address] [place], [place], [place]  [address] Phone: [phone]; Fax: [phone]

## 2022-09-28 ENCOUNTER — Telehealth: Payer: Self-pay

## 2022-09-28 DIAGNOSIS — R0609 Other forms of dyspnea: Secondary | ICD-10-CM

## 2022-09-28 DIAGNOSIS — D869 Sarcoidosis, unspecified: Secondary | ICD-10-CM

## 2022-09-28 NOTE — Telephone Encounter (Signed)
Pt advised that Dr Tenny Craw prefers to go ahead and do a Cardiac MRI so we will cancel the Echo for now.. pt agreed and verbalized understanding of her instructions.

## 2022-09-30 DIAGNOSIS — R42 Dizziness and giddiness: Secondary | ICD-10-CM

## 2022-09-30 DIAGNOSIS — Z79899 Other long term (current) drug therapy: Secondary | ICD-10-CM

## 2022-09-30 DIAGNOSIS — E782 Mixed hyperlipidemia: Secondary | ICD-10-CM

## 2022-09-30 DIAGNOSIS — R0609 Other forms of dyspnea: Secondary | ICD-10-CM

## 2022-10-17 ENCOUNTER — Ambulatory Visit: Payer: 59

## 2022-10-18 ENCOUNTER — Other Ambulatory Visit (HOSPITAL_COMMUNITY): Payer: Commercial Managed Care - HMO

## 2022-11-15 ENCOUNTER — Telehealth: Payer: Self-pay | Admitting: Pulmonary Disease

## 2022-11-15 NOTE — Telephone Encounter (Signed)
Please advise on Flomax Diskus & Asmax inhalers

## 2022-11-15 NOTE — Telephone Encounter (Signed)
Can send script for flovent diskus 100 mcg one puff bid, and rinse mouth after each use.  This will take the place of arnuity.  She should continue anoro one puff daily for now.

## 2022-11-16 MED ORDER — FLOVENT DISKUS 100 MCG/ACT IN AEPB: 1.0000 | INHALATION_SPRAY | Freq: Two times a day (BID) | RESPIRATORY_TRACT | 5 refills | Status: DC

## 2022-11-16 NOTE — Telephone Encounter (Signed)
Called and spoke with pt letting her know recs per VS and she verbalized understanding. Rx for flovent diskus has been sent to pharmacy for pt. Nothing further needed.

## 2022-11-17 ENCOUNTER — Institutional Professional Consult (permissible substitution): Payer: Commercial Managed Care - HMO | Admitting: Plastic Surgery

## 2022-12-05 ENCOUNTER — Telehealth: Payer: Commercial Managed Care - HMO | Admitting: *Deleted

## 2022-12-05 MED ORDER — VARENICLINE TARTRATE 1 MG PO TABS: 1.0000 mg | ORAL_TABLET | Freq: Two times a day (BID) | ORAL | 1 refills | Status: DC

## 2022-12-05 NOTE — Telephone Encounter (Signed)
Patient notified and agreed.  

## 2022-12-05 NOTE — Telephone Encounter (Signed)
Rx sent to pharmacy   

## 2022-12-05 NOTE — Telephone Encounter (Signed)
Patient called and stated that she finished the medication for smoking Cessation yesterday and requesting a refill.   Stated that she did NOT start this medication right away after her appointment on 09/04/22 but did finish the starter pack and wants refill to continue taking.   Please Advise.

## 2022-12-11 ENCOUNTER — Other Ambulatory Visit: Payer: Self-pay

## 2022-12-11 DIAGNOSIS — F3341 Major depressive disorder, recurrent, in partial remission: Secondary | ICD-10-CM

## 2022-12-11 MED ORDER — BUPROPION HCL ER (SR) 150 MG PO TB12: 150.0000 mg | ORAL_TABLET | Freq: Two times a day (BID) | ORAL | 5 refills | Status: DC

## 2022-12-27 ENCOUNTER — Ambulatory Visit: Payer: 59

## 2022-12-27 ENCOUNTER — Telehealth (HOSPITAL_COMMUNITY): Payer: Self-pay | Admitting: *Deleted

## 2022-12-27 NOTE — Telephone Encounter (Signed)
Attempted to call patient regarding upcoming cardiac MRI appointment. Left message on voicemail with name and callback number  Missie Gehrig RN Navigator Cardiac Imaging [facility] [phone] Office [phone] Cell  

## 2022-12-28 ENCOUNTER — Other Ambulatory Visit: Payer: Self-pay | Admitting: Internal Medicine

## 2022-12-28 ENCOUNTER — Ambulatory Visit (HOSPITAL_COMMUNITY)
Admission: RE | Admit: 2022-12-28 | Discharge: 2022-12-28 | Disposition: A | Discharge status: Home / Self Care | Payer: Commercial Managed Care - HMO | Source: Ambulatory Visit | Attending: Internal Medicine | Admitting: Internal Medicine

## 2022-12-28 DIAGNOSIS — R0609 Other forms of dyspnea: Secondary | ICD-10-CM

## 2022-12-28 DIAGNOSIS — D869 Sarcoidosis, unspecified: Secondary | ICD-10-CM | POA: Diagnosis present

## 2022-12-28 MED ORDER — GADOBUTROL 1 MMOL/ML IV SOLN
10.0000 mL | Freq: Once | INTRAVENOUS | Status: AC | PRN
  Administered 2022-12-28: 10 mL via INTRAVENOUS

## 2023-01-18 ENCOUNTER — Other Ambulatory Visit: Payer: Self-pay | Admitting: Nurse Practitioner

## 2023-01-18 ENCOUNTER — Other Ambulatory Visit: Payer: Self-pay | Admitting: *Deleted

## 2023-01-18 ENCOUNTER — Telehealth (HOSPITAL_BASED_OUTPATIENT_CLINIC_OR_DEPARTMENT_OTHER): Payer: Self-pay | Admitting: Pulmonary Disease

## 2023-01-18 DIAGNOSIS — J4489 Other specified chronic obstructive pulmonary disease: Secondary | ICD-10-CM

## 2023-01-18 DIAGNOSIS — D860 Sarcoidosis of lung: Secondary | ICD-10-CM

## 2023-01-18 MED ORDER — ROSUVASTATIN CALCIUM 40 MG PO TABS: 40.0000 mg | ORAL_TABLET | Freq: Every day | ORAL | 1 refills | Status: DC

## 2023-01-18 MED ORDER — MONTELUKAST SODIUM 10 MG PO TABS: 10.0000 mg | ORAL_TABLET | Freq: Every day | ORAL | 3 refills | Status: DC

## 2023-01-18 NOTE — Telephone Encounter (Signed)
Rx for pt's Singulair has been sent to preferred pharmacy for pt. Called and spoke with pt letting her know this was done and she verbalized understanding. Nothing further needed.

## 2023-01-18 NOTE — Telephone Encounter (Signed)
Pt needs refill on Singular. Please advise and call pt back.  Pharmacy: [facility] on [address] [phone]

## 2023-01-29 ENCOUNTER — Telehealth (HOSPITAL_BASED_OUTPATIENT_CLINIC_OR_DEPARTMENT_OTHER): Payer: Self-pay | Admitting: Pulmonary Disease

## 2023-01-29 MED ORDER — PANTOPRAZOLE SODIUM 40 MG PO TBEC: 40.0000 mg | DELAYED_RELEASE_TABLET | Freq: Every day | ORAL | 3 refills | Status: DC

## 2023-01-29 NOTE — Telephone Encounter (Signed)
Called patient to schedule recall and she states that she is out of [place] and needs a refill BUT since taking the medication she has had hip. Patient asking if this is a side effect and asked if she needs to try another medication or see her orthopedic instead. Pt is sch 4/25 for Dr. Halford Chessman. Please advise and call patient back.  Pharmacy: [facility] on [address]

## 2023-01-29 NOTE — Telephone Encounter (Signed)
Talk to patient states hip pain didn't start until she started prontonix and would like to know if this is a side effect and if an alternative can be sent in

## 2023-01-29 NOTE — Telephone Encounter (Signed)
Pantoprazole can cause joint pains.  She should stop using pantoprazole.  She can switch to pepcid 20 mg twice per day before meals.  If her hip pain continues, then she should contact her orthopedic surgeon or her PCP.

## 2023-01-30 MED ORDER — FAMOTIDINE 20 MG PO TABS: 20.0000 mg | ORAL_TABLET | Freq: Two times a day (BID) | ORAL | 0 refills | Status: DC

## 2023-01-30 NOTE — Telephone Encounter (Signed)
Spoke with patient advised to stop using pantoprazole. Sending pepcid in as an alternative. Advised pt to contact pcp or orthopedic surgeon if pain continues.  She verbalized understanding. NFN

## 2023-02-20 ENCOUNTER — Institutional Professional Consult (permissible substitution): Payer: Commercial Managed Care - HMO | Admitting: Plastic Surgery

## 2023-02-22 ENCOUNTER — Encounter (HOSPITAL_BASED_OUTPATIENT_CLINIC_OR_DEPARTMENT_OTHER): Payer: Self-pay | Admitting: Pulmonary Disease

## 2023-02-22 ENCOUNTER — Ambulatory Visit (HOSPITAL_BASED_OUTPATIENT_CLINIC_OR_DEPARTMENT_OTHER): Payer: Medicaid Other | Admitting: Pulmonary Disease

## 2023-02-22 VITALS — BP 130/70 | HR 62 | Ht 66.0 in | Wt 313.2 lb

## 2023-02-22 DIAGNOSIS — D860 Sarcoidosis of lung: Secondary | ICD-10-CM | POA: Diagnosis not present

## 2023-02-22 DIAGNOSIS — J4489 Other specified chronic obstructive pulmonary disease: Secondary | ICD-10-CM | POA: Diagnosis not present

## 2023-02-22 MED ORDER — BREZTRI AEROSPHERE 160-9-4.8 MCG/ACT IN AERO: 2.0000 | INHALATION_SPRAY | Freq: Two times a day (BID) | RESPIRATORY_TRACT | 5 refills | Status: DC

## 2023-02-22 MED ORDER — BREZTRI AEROSPHERE 160-9-4.8 MCG/ACT IN AERO: 2.0000 | INHALATION_SPRAY | Freq: Two times a day (BID) | RESPIRATORY_TRACT | 0 refills | Status: DC

## 2023-02-22 NOTE — Progress Notes (Signed)
[facility]  Chief Complaint  Patient presents with   Follow-up    Wants to know if she can do on methotrexate?     Constitutional:  BP 130/70 (BP Location: Left Wrist, Cuff Size: Normal)   Pulse 62   Ht  (1.676 m)   Wt (!) 313 lb 3.2 oz (142.1 kg)   SpO2 98%   BMI 50.55 kg/m   Past Medical History:  OA, Keloid, HTN, HLD, Hidradenitis, Depression, Carpal tunnel, Anxiety  Past Surgical History:  She  has a past surgical history that includes Other surgical history (2010); Multiple tooth extractions (2010); REMOVAL OF GROWTH FROM FINGER; Video bronchoscopy with endobronchial ultrasound (03/01/2012); Open reduction nasal fracture (06/29/2009); and Nasal turbinate reduction (Bilateral, 06/29/2009).  Brief Summary:  Kaitlyn [name] is a 57 y.o. female smoker with pulmonary sarcoidosis, and COPD.      Subjective:   Her dermatologist recommended cosentyx for her hidradenitis, but this was too expensive.  She was told that methotrexate could be an option instead.  She has been using electric cigarettes and this has helped her get off of tobacco cigarettes.  She didn't have any maintenance inhaler.  She has more cough and wheeze with chest congestion.  She found an old breztri inhaler and this helped.  Physical Exam:   Appearance - well kempt   ENMT - no sinus tenderness, no oral exudate, no LAN, Mallampati 3 airway, no stridor  Respiratory - equal breath sounds bilaterally, no wheezing or rales  CV - s1s2 regular rate and rhythm, no murmurs  Ext - no clubbing, no edema  Skin - no rashes  Psych - normal mood and affect    Pulmonary testing:  EBUS with Tbx 03/01/12 >> Streptococcus pneumoniae in BAL, cytology/Tbx negative PFT 03/13/12>>FEV1 1.65 (57%), FEV1% 58, TLC 4.44 (79%), DLCO 62%, no BD PFT 03/18/13 >> FEV1 1.78 (69%), FEV1% 67, TLC 4.04 (75%), DLCO 92%, no BD PFT 01/13/14 >> FEV1 1.64 (70%), FEV1% 70, TLC 3.69 (68%),  DLCO 79% PFT 08/22/22 >> FEV1 1.35 (47%), FEV1% 64, TLC 5.87 (109%), RV 3.06 (150%), DLCO 98%  Chest Imaging:  CT chest 01/23/12 >> bulky mediastinal/hilar LAN.  Scattered ill-defined upper lobe predominate perilymphatic and centrilobular nodules. CT chest 04/30/12 >> No change in the appearance of the mediastinal and hilar adenopathy and pulmonary nodularity CT chest 07/31/12 >> b/l hilar adenopathy no change, b/l upper lobe subpleural nodularity, improved LUL GGO CT chest 04/14/13 >> peribronchovascular nodularity b/l upper lobes Rt > Lt mildly progressed, no change to mediastinal/hilar adenopathy CT chest 01/05/14 >> upper lobe predominant prebronchovascular reticulo-nodule opacities, 1.2 cm Rt paratracheal LAN, 1.6 cm subcarinal LAN, 1.7 cm Rt hilar LAN  CT chest 10/07/14 >> decreased LAN CT chest 04/26/16 >> atherosclerosis, borderline LAN, patchy nodularity b/l  HRCT chest 12/16/19 >> atherosclerosis, mild patchy air trapping b/l, diffuse irregular thickening of interstitium with mild/mod central BTX and architectural distortion w/o change HRCT chest 08/15/22 >> widespread thickening of the peribronchovascular interstitium with some patchy peribronchovascular ground-glass attenuation, most evident throughout the mid to upper lungs bilaterally. Scattered areas of very subtle fissural micro nodularity are noted.  Mild air trapping.   Social History:  She  reports that she has been smoking e-cigarettes. She has been exposed to tobacco smoke. She has never used smokeless tobacco. She reports current alcohol use. She reports that she does not use drugs.  Family History:  Her family history includes Arthritis in her mother;  Cancer in her father and maternal aunt; Depression in her mother; Diabetes in her maternal aunt; Heart disease in her father; Hyperlipidemia in her mother; Hypertension in her father; Stroke in her mother.     Assessment/Plan:   COPD with chronic bronchitis and asthma. - will  restart breztri; samples provided - continue singulair - prn albuterol or duoneb  Pulmonary sarcoidosis. - has been on methotrexate since 2014 >> stopped in December 2022 - this has been stable on imaging studies; don't think this is an active issue at present   Upper airway cough syndrome with rhinitis and post nasal drip. - continue singulair, flonase  Tobacco abuse. - she is using electronic cigarettes as a bridge to stay off tobacco cigarettes  Obesity. - discussed importance of weight loss  Hidradenitis suppurativa. - she is followed by Dr. Elmon Else with Dermatology - advised that there are no pulmonary restrictions to her being on methotrexate for hidradenitis  Time Spent Involved in Patient Care on Day of Examination:  37 minutes  Follow up:   Patient Instructions  Breztri two puffs in the morning and two puffs in the evening, and rinse your mouth after each use  Follow up in 4 months  Medication List:   Allergies as of 02/22/2023       Reactions   Codeine Other (See Comments)   Reaction unknown   Cyclinex [tetracycline Hcl] Other (See Comments)   Reaction unknown, all "cyclines"   Milk Thistle Hives   Morphine And Related Other (See Comments)   Reaction unknown        Medication List        Accurate as of February 22, 2023  2:52 PM. If you have any questions, ask your nurse or doctor.          STOP taking these medications    Anoro Ellipta 62.5-25 MCG/ACT Aepb Generic drug: umeclidinium-vilanterol Stopped by: [name], MD   Flovent Diskus 100 MCG/ACT Aepb Generic drug: Fluticasone Propionate (Inhal) Stopped by: [name], MD   pantoprazole 40 MG tablet Commonly known as: Protonix Stopped by: [name], MD       TAKE these medications    acetaminophen 500 MG tablet Commonly known as: TYLENOL Take 500 mg by mouth every 6 (six) hours as needed. For pain   albuterol 108 (90 Base) MCG/ACT inhaler Commonly known as: VENTOLIN  HFA Inhale 2 puffs into the lungs every 6 (six) hours as needed for wheezing or shortness of breath.   aspirin EC 81 MG tablet Take 81 mg by mouth daily. Swallow whole.   Breztri Aerosphere 160-9-4.8 MCG/ACT Aero Generic drug: Budeson-Glycopyrrol-Formoterol Inhale 2 puffs into the lungs in the morning and at bedtime. Started by: [name], MD   Jerrye Bushy [phone] MCG/ACT Aero Generic drug: Budeson-Glycopyrrol-Formoterol Inhale 2 puffs into the lungs in the morning and at bedtime. Started by: [name], MD   buPROPion 150 MG 12 hr tablet Commonly known as: WELLBUTRIN SR Take 1 tablet (150 mg total) by mouth 2 (two) times daily.   calcium carbonate 1250 (500 Ca) MG tablet Commonly known as: OS-CAL - dosed in mg of elemental calcium Take 1 tablet by mouth.   cephALEXin 500 MG capsule Commonly known as: KEFLEX Take 1,000 mg by mouth daily.   clindamycin 1 % lotion Commonly known as: CLEOCIN T Apply 1 application  topically daily.   diclofenac Sodium 1 % Gel Commonly known as: VOLTAREN Apply 4 g topically as needed.   famotidine 20  MG tablet Commonly known as: PEPCID Take 1 tablet (20 mg total) by mouth 2 (two) times daily before a meal.   fluticasone 50 MCG/ACT nasal spray Commonly known as: FLONASE Place 1 spray into both nostrils daily.   furosemide 40 MG tablet Commonly known as: LASIX Take one tablet 1-2 times per week as needed for swelling.   GLUCOSAMINE HCL PO Take 1 tablet by mouth 2 (two) times daily.   hydrocortisone 2.5 % cream Apply 1 application topically 2 (two) times daily as needed for rash.   ipratropium-albuterol 0.5-2.5 (3) MG/3ML Soln Commonly known as: DUONEB Take 3 mLs by nebulization every 4 (four) hours as needed (shortness of breath  (D86.0, J44.9)).   losartan-hydrochlorothiazide 100-12.5 MG tablet Commonly known as: HYZAAR TAKE 1 TABLET BY MOUTH DAILY   metFORMIN 750 MG 24 hr tablet Commonly known as:  GLUCOPHAGE-XR Take 750 mg by mouth daily.   montelukast 10 MG tablet Commonly known as: SINGULAIR Take 1 tablet (10 mg total) by mouth at bedtime.   omega-3 fish oil 1000 MG Caps capsule Commonly known as: MAXEPA Take by mouth.   potassium chloride SA 20 MEQ tablet Commonly known as: KLOR-CON M Take one tablet 1-2 times per week as needed.  Take with Lasix.   rosuvastatin 40 MG tablet Commonly known as: CRESTOR Take 1 tablet (40 mg total) by mouth daily.   sertraline 25 MG tablet Commonly known as: ZOLOFT Take 1 tablet (25 mg total) by mouth daily.   traMADol 50 MG tablet Commonly known as: ULTRAM Take 1 tablet (50 mg total) by mouth every 12 (twelve) hours as needed for moderate pain.   TURMERIC PO Take 538 mg by mouth 2 (two) times a day.   varenicline 1 MG tablet Commonly known as: Chantix Continuing Month Pak Take 1 tablet (1 mg total) by mouth 2 (two) times daily. What changed: Another medication with the same name was removed. Continue taking this medication, and follow the directions you see here. Changed by: [name], MD   vitamin B-12 100 MCG tablet Commonly known as: CYANOCOBALAMIN Take 200 mcg by mouth daily. 1000 x2   VITAMIN D PO Take by mouth.        Signature:  [name], MD [facility]/Critical Care Pager - [phone] 02/22/2023, 2:52 PM

## 2023-02-22 NOTE — Patient Instructions (Signed)
Breztri two puffs in the morning and two puffs in the evening, and rinse your mouth after each use  Follow up in 4 months

## 2023-02-26 ENCOUNTER — Telehealth: Payer: Self-pay

## 2023-02-26 NOTE — Telephone Encounter (Signed)
PA request received via CMM for Breztri Aerosphere 160-9-4.8MCG/ACT aerosol  PA has been submitted to [employer] and is pending determination  Key: [id number]

## 2023-02-27 NOTE — Telephone Encounter (Signed)
Dr. Craige Cotta please advise? See previous message from PA team

## 2023-02-27 NOTE — Telephone Encounter (Signed)
PA has been DENIED due to:   Here are the policy requirements your request did not meet: Per your health plan's criteria, this drug is covered if you meet the following: One of the following: (1) You have failed two preferred drugs as confirmed by claims history or submission of medical records:. The preferred drugs: brand Advair Diskus and brand Symbicort. (2) You cannot use two preferred drugs (please specify contraindication or intolerance).

## 2023-02-27 NOTE — Telephone Encounter (Signed)
As usual the insurance plans don't take into consideration that advair and symbicort are not equivalent to combination of medications in breztri.  Please let the patient know that her insurance won't pay for breztri until she has tried their preferred options.  As such, please send a script for symbicort 160 two puffs bid and spiriva respimat 2.5 two puffs daily.  Please explain to her that she will need to use two inhalers because of her insurance restrictions in order to replace what she would have gotten from using breztri.

## 2023-03-02 MED ORDER — SPIRIVA RESPIMAT 2.5 MCG/ACT IN AERS: 2.0000 | INHALATION_SPRAY | Freq: Every day | RESPIRATORY_TRACT | 0 refills | Status: DC

## 2023-03-02 MED ORDER — BUDESONIDE-FORMOTEROL FUMARATE 160-4.5 MCG/ACT IN AERO: 2.0000 | INHALATION_SPRAY | Freq: Two times a day (BID) | RESPIRATORY_TRACT | 0 refills | Status: DC

## 2023-03-02 NOTE — Addendum Note (Signed)
Addended by: Arlyss Repress on: 03/02/2023 04:37 PM   Modules accepted: Orders

## 2023-03-02 NOTE — Telephone Encounter (Signed)
Called and spoke with patient. She verbalized understanding. Patient verified pharmacy. Prescriptions have been sent to the pharmacy.   Nothing further needed.

## 2023-03-05 ENCOUNTER — Ambulatory Visit: Payer: Medicaid Other | Admitting: Nurse Practitioner

## 2023-04-09 ENCOUNTER — Encounter: Payer: Self-pay | Admitting: Nurse Practitioner

## 2023-04-09 ENCOUNTER — Ambulatory Visit (INDEPENDENT_AMBULATORY_CARE_PROVIDER_SITE_OTHER): Payer: Medicaid Other | Admitting: Nurse Practitioner

## 2023-04-09 VITALS — BP 160/90 | HR 76 | Temp 97.7°F | Resp 20 | Ht 66.0 in | Wt 318.0 lb

## 2023-04-09 DIAGNOSIS — Z79899 Other long term (current) drug therapy: Secondary | ICD-10-CM

## 2023-04-09 DIAGNOSIS — J4489 Other specified chronic obstructive pulmonary disease: Secondary | ICD-10-CM

## 2023-04-09 DIAGNOSIS — Z1159 Encounter for screening for other viral diseases: Secondary | ICD-10-CM | POA: Diagnosis not present

## 2023-04-09 DIAGNOSIS — I10 Essential (primary) hypertension: Secondary | ICD-10-CM

## 2023-04-09 DIAGNOSIS — L732 Hidradenitis suppurativa: Secondary | ICD-10-CM | POA: Diagnosis not present

## 2023-04-09 DIAGNOSIS — F321 Major depressive disorder, single episode, moderate: Secondary | ICD-10-CM

## 2023-04-09 DIAGNOSIS — M25551 Pain in right hip: Secondary | ICD-10-CM

## 2023-04-09 DIAGNOSIS — E782 Mixed hyperlipidemia: Secondary | ICD-10-CM

## 2023-04-09 NOTE — Progress Notes (Signed)
Careteam: Patient Care Team: Sharon Seller, NP as PCP - General (Geriatric Medicine) [name], MD as PCP - Cardiology (Cardiology)  PLACE OF SERVICE:  [facility]  Advanced Directive information Does Patient Have a Medical Advance Directive?: No, Would patient like information on creating a medical advance directive?: No - Patient declined  Allergies  Allergen Reactions   Codeine Other (See Comments)    Reaction unknown   Cyclinex [Tetracycline Hcl] Other (See Comments)    Reaction unknown, all "cyclines"   Milk Thistle Hives   Morphine And Codeine Other (See Comments)    Reaction unknown    Chief Complaint  Patient presents with   Medical Management of Chronic Issues    Patient is here for a 84M follow up for chronic conditions patient complains of right hip sciatic pain    Quality Metric Gaps    Patient is due for pap exam, mammography,Hep C screening Discuss need for for shingrix and updated covid     HPI: Patient is a 57 y.o. female for follow up.  Went to the hospital with her mom  and came home with a cold.  She followed by dermatologist due to chronic rash ( Hidradenitis suppurativa) and wants to start methotrexate in the past for sarcoidosis but this resolved and wants to get back on methotrexate needing lab work.  Also looking to have surgery to help resolve issue Not able to get mammogram due to flare.   Had a referral for PAP but did not return call to schedule appt.   She has quit smoking but using a e cig, her brother smokes so she has second hand smoke exposure.    Review of Systems:  Review of Systems  Constitutional:  Negative for chills, fever and weight loss.  HENT:  Negative for tinnitus.   Respiratory:  Positive for cough. Negative for sputum production and shortness of breath.   Cardiovascular:  Negative for chest pain, palpitations and leg swelling.  Gastrointestinal:  Negative for abdominal pain, constipation, diarrhea and  heartburn.  Genitourinary:  Negative for dysuria, frequency and urgency.  Musculoskeletal:  Positive for back pain, joint pain and myalgias. Negative for falls.  Skin: Negative.   Neurological:  Negative for dizziness and headaches.  Psychiatric/Behavioral:  Negative for depression and memory loss. The patient does not have insomnia.     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   PFT 03/13/12>>FEV1 1.65 (57%), FEV1% 58, TLC 4.44 (79%), DLCO 62%, no BD    Depression    Extrinsic asthma, unspecified    Hidradenitis    Hyperlipidemia    takes pravachol   Hypertension    Keloid scar    Obesity    Osteoarthritis    Osteoarthrosis, unspecified whether generalized or localized, unspecified site    PONV (postoperative nausea and vomiting)    Pulmonary sarcoidosis (HCC) 02/22/2012   PFT 03/13/12>>FEV1 1.65 (57%), FEV1% 58, TLC 4.44 (79%), DLCO 62%, no BD EBUS with Tbx 03/01/12>>Streptococcus pneumoniae in BAL, cytology/Tbx negative Likely sarcoidosis>>start prednisone 04/30/12    Sarcoidosis    Shortness of breath    exertion   Wears dentures    top   Past Surgical History:  Procedure Laterality Date   MULTIPLE TOOTH EXTRACTIONS  2010   NASAL TURBINATE REDUCTION Bilateral 06/29/2009   inferior   OPEN  REDUCTION NASAL FRACTURE  06/29/2009   with exc. bx. left intranasal papilloma   OTHER SURGICAL HISTORY  2010   cyst removal from right middle finger     REMOVAL OF GROWTH FROM FINGER     DR MEYERDIERKS   VIDEO BRONCHOSCOPY WITH ENDOBRONCHIAL ULTRASOUND  03/01/2012   Social History:   reports that she has been smoking e-cigarettes. She has been exposed to tobacco smoke. She has never used smokeless tobacco. She reports current alcohol use. She reports that she does not use drugs.  Family History  Problem Relation Age of Onset   Hyperlipidemia Mother    Stroke Mother     Arthritis Mother    Depression Mother    Heart disease Father    Hypertension Father    Cancer Father    Cancer Maternal Aunt        mother's aunt   Diabetes Maternal Aunt     Medications: Patient's Medications  New Prescriptions   No medications on file  Previous Medications   ACETAMINOPHEN (TYLENOL) 500 MG TABLET    Take 500 mg by mouth every 6 (six) hours as needed. For pain   ALBUTEROL (VENTOLIN HFA) 108 (90 BASE) MCG/ACT INHALER    Inhale 2 puffs into the lungs every 6 (six) hours as needed for wheezing or shortness of breath.   ASPIRIN EC 81 MG TABLET    Take 81 mg by mouth daily. Swallow whole.   BUDESON-GLYCOPYRROL-FORMOTEROL (BREZTRI AEROSPHERE) 160-9-4.8 MCG/ACT AERO    Inhale 2 puffs into the lungs in the morning and at bedtime.   BUDESON-GLYCOPYRROL-FORMOTEROL (BREZTRI AEROSPHERE) 160-9-4.8 MCG/ACT AERO    Inhale 2 puffs into the lungs in the morning and at bedtime.   BUDESONIDE-FORMOTEROL (SYMBICORT) 160-4.5 MCG/ACT INHALER    Inhale 2 puffs into the lungs 2 (two) times daily.   BUPROPION (WELLBUTRIN SR) 150 MG 12 HR TABLET    Take 1 tablet (150 mg total) by mouth 2 (two) times daily.   CALCIUM CARBONATE (OS-CAL - DOSED IN MG OF ELEMENTAL CALCIUM) 1250 (500 CA) MG TABLET    Take 1 tablet by mouth.   CEPHALEXIN (KEFLEX) 500 MG CAPSULE    Take 1,000 mg by mouth daily.   CLINDAMYCIN (CLEOCIN T) 1 % LOTION    Apply 1 application  topically daily.   DICLOFENAC SODIUM (VOLTAREN) 1 % GEL    Apply 4 g topically as needed.   FAMOTIDINE (PEPCID) 20 MG TABLET    Take 1 tablet (20 mg total) by mouth 2 (two) times daily before a meal.   FLUTICASONE (FLONASE) 50 MCG/ACT NASAL SPRAY    Place 1 spray into both nostrils daily.   FUROSEMIDE (LASIX) 40 MG TABLET    Take one tablet 1-2 times per week as needed for swelling.   GLUCOSAMINE HCL PO    Take 1 tablet by mouth 2 (two) times daily.   HYDROCORTISONE 2.5 % CREAM    Apply 1 application topically 2 (two) times daily as needed for rash.    IPRATROPIUM-ALBUTEROL (DUONEB) 0.5-2.5 (3) MG/3ML SOLN    Take 3 mLs by nebulization every 4 (four) hours as needed (shortness of breath  (D86.0, J44.9)).   LOSARTAN-HYDROCHLOROTHIAZIDE (HYZAAR) 100-12.5 MG TABLET    TAKE 1 TABLET BY MOUTH DAILY   METFORMIN (GLUCOPHAGE-XR) 750 MG 24 HR TABLET    Take 750 mg by mouth daily.   MONTELUKAST (SINGULAIR) 10 MG TABLET    Take 1 tablet (10 mg total) by mouth at bedtime.  OMEGA-3 FISH OIL (MAXEPA) 1000 MG CAPS CAPSULE    Take by mouth.   POTASSIUM CHLORIDE SA (KLOR-CON) 20 MEQ TABLET    Take one tablet 1-2 times per week as needed.  Take with Lasix.   ROSUVASTATIN (CRESTOR) 40 MG TABLET    Take 1 tablet (40 mg total) by mouth daily.   SERTRALINE (ZOLOFT) 25 MG TABLET    Take 1 tablet (25 mg total) by mouth daily.   TIOTROPIUM BROMIDE MONOHYDRATE (SPIRIVA RESPIMAT) 2.5 MCG/ACT AERS    Inhale 2 puffs into the lungs daily.   TRAMADOL (ULTRAM) 50 MG TABLET    Take 1 tablet (50 mg total) by mouth every 12 (twelve) hours as needed for moderate pain.   TURMERIC PO    Take 538 mg by mouth 2 (two) times a day.    VARENICLINE (CHANTIX CONTINUING MONTH PAK) 1 MG TABLET    Take 1 tablet (1 mg total) by mouth 2 (two) times daily.   VITAMIN B-12 (CYANOCOBALAMIN) 100 MCG TABLET    Take 200 mcg by mouth daily. 1000 x2   VITAMIN D PO    Take by mouth.  Modified Medications   No medications on file  Discontinued Medications   No medications on file    Physical Exam:  Vitals:   04/09/23 1124  BP: (!) 160/90  Pulse: 76  Resp: 20  Temp: 97.7 F (36.5 C)  SpO2: 97%  Weight: (!) 318 lb (144.2 kg)  Height: 5\' 6"  (1.676 m)   Body mass index is 51.33 kg/m. Wt Readings from Last 3 Encounters:  04/09/23 (!) 318 lb (144.2 kg)  02/22/23 (!) 313 lb 3.2 oz (142.1 kg)  09/27/22 (!) 318 lb 12.8 oz (144.6 kg)    Physical Exam Constitutional:      General: She is not in acute distress.    Appearance: She is well-developed. She is obese. She is not diaphoretic.   HENT:     Head: Normocephalic and atraumatic.     Mouth/Throat:     Pharynx: No oropharyngeal exudate.  Eyes:     Conjunctiva/sclera: Conjunctivae normal.     Pupils: Pupils are equal, round, and reactive to light.  Cardiovascular:     Rate and Rhythm: Normal rate and regular rhythm.     Heart sounds: Normal heart sounds.  Pulmonary:     Effort: Pulmonary effort is normal.     Breath sounds: Normal breath sounds.  Abdominal:     General: Bowel sounds are normal.     Palpations: Abdomen is soft.  Musculoskeletal:     Cervical back: Normal range of motion and neck supple.     Right hip: Tenderness present. Decreased range of motion.     Left hip: Normal.     Right lower leg: No edema.     Left lower leg: No edema.  Skin:    General: Skin is warm and dry.  Neurological:     Mental Status: She is alert and oriented to person, place, and time. Mental status is at baseline.  Psychiatric:        Mood and Affect: Mood normal.     Labs reviewed: Basic Metabolic Panel: Recent Labs    09/04/22 1138  NA 137  K 5.0  CL 102  CO2 25  GLUCOSE 81  BUN 15  CREATININE 0.97  CALCIUM 9.2   Liver Function Tests: Recent Labs    09/04/22 1138  AST 20  ALT 12  BILITOT 0.5  PROT  7.7   No results for input(s): "LIPASE", "AMYLASE" in the last 8760 hours. No results for input(s): "AMMONIA" in the last 8760 hours. CBC: Recent Labs    09/04/22 1138  WBC 10.0  NEUTROABS 6,930  HGB 13.6  HCT 40.9  MCV 91.1  PLT 218   Lipid Panel: No results for input(s): "CHOL", "HDL", "LDLCALC", "TRIG", "CHOLHDL", "LDLDIRECT" in the last 8760 hours. TSH: No results for input(s): "TSH" in the last 8760 hours. A1C: Lab Results  Component Value Date   HGBA1C 5.4 12/28/2016     Assessment/Plan 1. Hidradenitis suppurativa Followed by dermatology and wants to be placed on methotrexate. Requesting labs at this time - COMPLETE METABOLIC PANEL WITH GFR - CBC with Differential/Platelet -  QuantiFERON-TB Gold Plus - Hepatitis B Core AB, Total - Hepatitis C antibody - Hep B Surface Antibody  2. High risk medication use - COMPLETE METABOLIC PANEL WITH GFR - CBC with Differential/Platelet - QuantiFERON-TB Gold Plus - Hepatitis B Core AB, Total - Hepatitis C antibody - Hep B Surface Antibody  3. Need for hepatitis C screening test - Hepatitis C antibody  4. Depression, major, single episode, moderate (HCC) -stable at this time. Continues on zoloft and wellbutrin daily   5. COPD with chronic bronchitis -followed by pulmonary, recent changes in medication but does not feel like it is a good fit for her. Continues to have coughing and congestion.   6. Morbid obesity (HCC) -BMI- 51- education provided on healthy weight loss through increase in physical activity and proper nutrition   7. Benign essential HTN -Blood pressure well controlled, goal bp <140/90 Continue current medications and dietary modifications follow metabolic panel  8. Mixed hyperlipidemia -continues on crestor 40  - Lipid panel  9. Right hip pain Plans to follow up with orthopedic.    Return in about 6 months (around 10/09/2023) for routine follow up .:  Ananya Mccleese K. [facility]ag[facility] [phone]

## 2023-04-09 NOTE — Patient Instructions (Addendum)
    [facility]   Address: [address], [place], [place] [address]   Phone: [phone]

## 2023-04-11 LAB — COMPLETE METABOLIC PANEL WITH GFR
AG Ratio: 1 (calc) (ref 1.0–2.5)
ALT: 10 U/L (ref 6–29)
AST: 15 U/L (ref 10–35)
Albumin: 3.6 g/dL (ref 3.6–5.1)
Alkaline phosphatase (APISO): 92 U/L (ref 37–153)
BUN: 9 mg/dL (ref 7–25)
CO2: 28 mmol/L (ref 20–32)
Calcium: 9.3 mg/dL (ref 8.6–10.4)
Chloride: 105 mmol/L (ref 98–110)
Creat: 0.94 mg/dL (ref 0.50–1.03)
Globulin: 3.6 g/dL (calc) (ref 1.9–3.7)
Glucose, Bld: 96 mg/dL (ref 65–99)
Potassium: 4.1 mmol/L (ref 3.5–5.3)
Sodium: 141 mmol/L (ref 135–146)
Total Bilirubin: 0.4 mg/dL (ref 0.2–1.2)
Total Protein: 7.2 g/dL (ref 6.1–8.1)
eGFR: 70 mL/min/{1.73_m2} (ref >=60)

## 2023-04-11 LAB — LIPID PANEL
Cholesterol: 127 mg/dL (ref <200)
HDL: 51 mg/dL (ref >=50)
LDL Cholesterol (Calc): 60 mg/dL (calc)
Non-HDL Cholesterol (Calc): 76 mg/dL (calc) (ref <130)
Total CHOL/HDL Ratio: 2.5 (calc) (ref <5.0)
Triglycerides: 79 mg/dL (ref <150)

## 2023-04-11 LAB — CBC WITH DIFFERENTIAL/PLATELET
Absolute Monocytes: 535 cells/uL (ref 200–950)
Basophils Absolute: 30 cells/uL (ref 0–200)
Basophils Relative: 0.3 %
Eosinophils Absolute: 149 cells/uL (ref 15–500)
Eosinophils Relative: 1.5 %
HCT: 40.2 % (ref 35.0–45.0)
Hemoglobin: 13 g/dL (ref 11.7–15.5)
Lymphs Abs: 1782 cells/uL (ref 850–3900)
MCH: 30 pg (ref 27.0–33.0)
MCHC: 32.3 g/dL (ref 32.0–36.0)
MCV: 92.6 fL (ref 80.0–100.0)
MPV: 11.5 fL (ref 7.5–12.5)
Monocytes Relative: 5.4 %
Neutro Abs: 7405 cells/uL (ref 1500–7800)
Neutrophils Relative %: 74.8 %
Platelets: 243 10*3/uL (ref 140–400)
RBC: 4.34 10*6/uL (ref 3.80–5.10)
RDW: 12.9 % (ref 11.0–15.0)
Total Lymphocyte: 18 %
WBC: 9.9 10*3/uL (ref 3.8–10.8)

## 2023-04-11 LAB — HEPATITIS C ANTIBODY: Hepatitis C Ab: NONREACTIVE

## 2023-04-11 LAB — QUANTIFERON-TB GOLD PLUS
Mitogen-NIL: 4.55 IU/mL
NIL: 0.03 IU/mL
QuantiFERON-TB Gold Plus: NEGATIVE
TB1-NIL: <0 IU/mL
TB2-NIL: 0 IU/mL

## 2023-04-11 LAB — HEPATITIS B SURFACE ANTIBODY,QUALITATIVE: Hep B S Ab: REACTIVE — AB

## 2023-04-11 LAB — HEPATITIS B CORE ANTIBODY, TOTAL: Hep B Core Total Ab: NONREACTIVE

## 2023-04-17 ENCOUNTER — Telehealth: Payer: Self-pay | Admitting: Nurse Practitioner

## 2023-04-17 DIAGNOSIS — D869 Sarcoidosis, unspecified: Secondary | ICD-10-CM

## 2023-04-17 NOTE — Telephone Encounter (Signed)
[facility] faxed over a request for lab work to be performed on 05/07/23 (CBC w/diff, CMP - Dx D86.9), and then they want the results faxed to their office. I have put the hard copy of this faxed request on your desk.

## 2023-04-18 NOTE — Addendum Note (Signed)
Addended by: Sharon Seller on: 04/18/2023 09:47 AM   Modules accepted: Orders

## 2023-04-18 NOTE — Telephone Encounter (Signed)
Order placed

## 2023-04-18 NOTE — Addendum Note (Signed)
Addended by: Sharon Seller on: 04/18/2023 11:01 AM   Modules accepted: Orders

## 2023-04-25 ENCOUNTER — Telehealth: Payer: Self-pay | Admitting: Nurse Practitioner

## 2023-04-25 NOTE — Telephone Encounter (Signed)
LVM for patient to come in for labs that Shanda Bumps ordered, per [facility] request (which they wanted performed on or before 05/07/23).

## 2023-05-01 ENCOUNTER — Other Ambulatory Visit: Payer: Medicaid Other

## 2023-05-01 ENCOUNTER — Other Ambulatory Visit: Payer: Self-pay

## 2023-05-01 DIAGNOSIS — D869 Sarcoidosis, unspecified: Secondary | ICD-10-CM

## 2023-05-01 LAB — CBC WITH DIFFERENTIAL/PLATELET
Absolute Monocytes: 659 cells/uL (ref 200–950)
Basophils Absolute: 31 cells/uL (ref 0–200)
Basophils Relative: 0.3 %
Eosinophils Absolute: 165 cells/uL (ref 15–500)
Eosinophils Relative: 1.6 %
HCT: 39.6 % (ref 35.0–45.0)
Hemoglobin: 13 g/dL (ref 11.7–15.5)
Lymphs Abs: 2287 cells/uL (ref 850–3900)
MCH: 30.4 pg (ref 27.0–33.0)
MCHC: 32.8 g/dL (ref 32.0–36.0)
MCV: 92.5 fL (ref 80.0–100.0)
MPV: 11.7 fL (ref 7.5–12.5)
Monocytes Relative: 6.4 %
Neutro Abs: 7159 cells/uL (ref 1500–7800)
Neutrophils Relative %: 69.5 %
Platelets: 242 10*3/uL (ref 140–400)
RBC: 4.28 10*6/uL (ref 3.80–5.10)
RDW: 13.1 % (ref 11.0–15.0)
Total Lymphocyte: 22.2 %
WBC: 10.3 10*3/uL (ref 3.8–10.8)

## 2023-05-07 ENCOUNTER — Other Ambulatory Visit: Payer: Self-pay

## 2023-05-07 MED ORDER — IPRATROPIUM-ALBUTEROL 0.5-2.5 (3) MG/3ML IN SOLN: 3.0000 mL | RESPIRATORY_TRACT | 5 refills | Status: DC | PRN

## 2023-05-09 ENCOUNTER — Other Ambulatory Visit: Payer: Self-pay

## 2023-05-09 DIAGNOSIS — D869 Sarcoidosis, unspecified: Secondary | ICD-10-CM

## 2023-05-10 ENCOUNTER — Other Ambulatory Visit: Payer: Medicaid Other

## 2023-05-11 ENCOUNTER — Ambulatory Visit: Payer: Medicaid Other

## 2023-05-11 ENCOUNTER — Other Ambulatory Visit: Payer: Medicaid Other

## 2023-05-11 ENCOUNTER — Other Ambulatory Visit: Payer: Self-pay | Admitting: Nurse Practitioner

## 2023-05-11 DIAGNOSIS — Z1231 Encounter for screening mammogram for malignant neoplasm of breast: Secondary | ICD-10-CM

## 2023-05-12 LAB — COMPLETE METABOLIC PANEL WITH GFR
AG Ratio: 1 (calc) (ref 1.0–2.5)
ALT: 7 U/L (ref 6–29)
AST: 12 U/L (ref 10–35)
Albumin: 3.2 g/dL — ABNORMAL LOW (ref 3.6–5.1)
Alkaline phosphatase (APISO): 85 U/L (ref 37–153)
BUN: 11 mg/dL (ref 7–25)
CO2: 28 mmol/L (ref 20–32)
Calcium: 8.9 mg/dL (ref 8.6–10.4)
Chloride: 106 mmol/L (ref 98–110)
Creat: 0.88 mg/dL (ref 0.50–1.03)
Globulin: 3.3 g/dL (calc) (ref 1.9–3.7)
Glucose, Bld: 124 mg/dL — ABNORMAL HIGH (ref 65–99)
Potassium: 3.9 mmol/L (ref 3.5–5.3)
Sodium: 140 mmol/L (ref 135–146)
Total Bilirubin: 0.3 mg/dL (ref 0.2–1.2)
Total Protein: 6.5 g/dL (ref 6.1–8.1)
eGFR: 76 mL/min/{1.73_m2} (ref >=60)

## 2023-05-15 ENCOUNTER — Telehealth (HOSPITAL_BASED_OUTPATIENT_CLINIC_OR_DEPARTMENT_OTHER): Payer: Self-pay | Admitting: Pulmonary Disease

## 2023-05-15 NOTE — Telephone Encounter (Signed)
Patient states insurance is needing a prior auth for her to receive her breztri. Please advise and call patient back if needed.

## 2023-05-17 ENCOUNTER — Other Ambulatory Visit (HOSPITAL_COMMUNITY): Payer: Self-pay

## 2023-05-17 NOTE — Telephone Encounter (Signed)
Medication changed due to insurance not covering Breztri.

## 2023-05-18 NOTE — Telephone Encounter (Signed)
Okay to start prior authorization for breztri.

## 2023-05-18 NOTE — Telephone Encounter (Signed)
Pa team can you please start PA for [facility]

## 2023-05-18 NOTE — Telephone Encounter (Signed)
Spoke with patient. She is want to go back to [facility] inhaler. States she has been on Spiriva and Symbicort for a couple months states they work okay but Art gallery manager works much better. Dr. Craige Cotta are you okay with patient going back to [facility]. She will need PA per insurance, but she would like to try again

## 2023-05-25 ENCOUNTER — Telehealth: Payer: Self-pay

## 2023-05-25 NOTE — Telephone Encounter (Signed)
*  Pulm  Pharmacy Patient Advocate Encounter   Received notification from Pt Calls Messages that prior authorization for Breztri Aerosphere 160-9-4.8MCG/ACT aerosol  is required/requested.   Insurance verification completed.   The patient is insured through [facility] .   Per test claim: PA required; PA submitted to [facility] via CoverMyMeds Key/confirmation #/EOC [id number] Status is pending

## 2023-05-25 NOTE — Telephone Encounter (Signed)
PA submitted to plan and is pending determination

## 2023-05-29 NOTE — Telephone Encounter (Signed)
PA has been DENIED due to:   Here are the policy requirements your request did not meet: Per your health plan's criteria, this drug is covered if you meet the following: One of the following: (1) You have failed one preferred drug as confirmed by claims history or submission of medical records: The preferred drugs: brand Advair Diskus and Dulera. (2) You cannot use one preferred drug (please specify contraindication or intolerance).

## 2023-07-05 ENCOUNTER — Telehealth: Payer: Self-pay | Admitting: Pulmonary Disease

## 2023-07-05 NOTE — Telephone Encounter (Signed)
Pt calling in to get a call about getting her inhalers

## 2023-07-05 NOTE — Telephone Encounter (Signed)
LVM need to know which inhalers pt needs.

## 2023-07-05 NOTE — Telephone Encounter (Signed)
Pt has been on both Advair and Dulera. Pt also needs sample Breztri.

## 2023-07-05 NOTE — Telephone Encounter (Signed)
Is she asking for refills?

## 2023-07-06 NOTE — Telephone Encounter (Signed)
Not sure why this was routed to PA team.

## 2023-07-11 NOTE — Telephone Encounter (Signed)
LMOM for patient to call or send message letting us know which inhalers she needed.

## 2023-07-20 ENCOUNTER — Telehealth: Payer: Self-pay | Admitting: Pulmonary Disease

## 2023-07-20 MED ORDER — ALBUTEROL SULFATE HFA 108 (90 BASE) MCG/ACT IN AERS: 2.0000 | INHALATION_SPRAY | Freq: Four times a day (QID) | RESPIRATORY_TRACT | 5 refills | Status: DC | PRN

## 2023-07-20 NOTE — Telephone Encounter (Signed)
Patient would like to leave a message for Nicki. Patient needs Albuterol, breztri but her insurance will not cover it. She needs communication sent to her insurance company that she has tried [place] and advir so that she can get the albuterol and breztri.  She would also like samples of Breztri. She is out and currently using albuterol only.

## 2023-07-25 ENCOUNTER — Other Ambulatory Visit (HOSPITAL_BASED_OUTPATIENT_CLINIC_OR_DEPARTMENT_OTHER): Payer: Self-pay

## 2023-07-25 ENCOUNTER — Telehealth: Payer: Self-pay

## 2023-07-25 MED ORDER — BREZTRI AEROSPHERE 160-9-4.8 MCG/ACT IN AERO: 2.0000 | INHALATION_SPRAY | Freq: Two times a day (BID) | RESPIRATORY_TRACT | Status: DC

## 2023-07-25 NOTE — Telephone Encounter (Signed)
PA request has been Submitted. New Encounter created for follow up. For additional info see Pharmacy Prior Auth telephone encounter from 09/25.

## 2023-07-25 NOTE — Telephone Encounter (Signed)
*  Pulm  Pharmacy Patient Advocate Encounter   Received notification from Pt Calls Messages that prior authorization for Breztri Aerosphere 160-9-4.8MCG/ACT aerosol  is required/requested.   Insurance verification completed.   The patient is insured through [facility] .   Per test claim: PA required; PA submitted to [facility] via CoverMyMeds Key/confirmation #/EOC [id number] Status is pending

## 2023-07-26 ENCOUNTER — Other Ambulatory Visit (HOSPITAL_COMMUNITY): Payer: Self-pay

## 2023-07-26 NOTE — Telephone Encounter (Signed)
Pharmacy Patient Advocate Encounter  Received notification from [facility] that Prior Authorization for [facility] Aerosphere 160-9-4.8MCG/ACT aerosol  has been APPROVED from 07/25/2023 to 07/24/2024. Ran test claim, Copay is $4.00. This test claim was processed through [facility]- copay amounts may vary at other pharmacies due to pharmacy/plan contracts, or as the patient moves through the different stages of their insurance plan.

## 2023-07-27 ENCOUNTER — Other Ambulatory Visit: Payer: Self-pay | Admitting: Internal Medicine

## 2023-07-27 ENCOUNTER — Other Ambulatory Visit: Payer: Self-pay | Admitting: Nurse Practitioner

## 2023-07-27 DIAGNOSIS — F321 Major depressive disorder, single episode, moderate: Secondary | ICD-10-CM

## 2023-09-14 LAB — CBC AND DIFFERENTIAL
HCT: 44 (ref 36–46)
Hemoglobin: 14.4 (ref 12.0–16.0)
Neutrophils Absolute: 7957
Platelets: 256 10*3/uL (ref 150–400)
WBC: 10.9

## 2023-09-14 LAB — HEPATIC FUNCTION PANEL
ALT: 10 U/L (ref 7–35)
AST: 15 (ref 13–35)
Alkaline Phosphatase: 130 — AB (ref 25–125)
Bilirubin, Total: 0.4

## 2023-09-14 LAB — BASIC METABOLIC PANEL
BUN: 13 (ref 4–21)
CO2: 29 — AB (ref 13–22)
Chloride: 101 (ref 99–108)
Creatinine: 1.1 (ref 0.5–1.1)
Glucose: 123
Potassium: 4.1 meq/L (ref 3.5–5.1)
Sodium: 137 (ref 137–147)

## 2023-09-14 LAB — COMPREHENSIVE METABOLIC PANEL
Albumin: 3.7 (ref 3.5–5.0)
Calcium: 9.3 (ref 8.7–10.7)
Globulin: 4

## 2023-09-14 LAB — CBC: RBC: 4.79 (ref 3.87–5.11)

## 2023-10-15 ENCOUNTER — Ambulatory Visit (INDEPENDENT_AMBULATORY_CARE_PROVIDER_SITE_OTHER): Payer: Medicaid Other | Admitting: Nurse Practitioner

## 2023-10-15 VITALS — BP 148/92 | HR 82 | Temp 98.6°F | Resp 20 | Ht 66.0 in | Wt 314.4 lb

## 2023-10-15 DIAGNOSIS — J4489 Other specified chronic obstructive pulmonary disease: Secondary | ICD-10-CM | POA: Diagnosis not present

## 2023-10-15 DIAGNOSIS — F321 Major depressive disorder, single episode, moderate: Secondary | ICD-10-CM

## 2023-10-15 DIAGNOSIS — I10 Essential (primary) hypertension: Secondary | ICD-10-CM

## 2023-10-15 DIAGNOSIS — L732 Hidradenitis suppurativa: Secondary | ICD-10-CM

## 2023-10-15 DIAGNOSIS — Z23 Encounter for immunization: Secondary | ICD-10-CM | POA: Diagnosis not present

## 2023-10-15 DIAGNOSIS — E782 Mixed hyperlipidemia: Secondary | ICD-10-CM | POA: Diagnosis not present

## 2023-10-15 DIAGNOSIS — F17200 Nicotine dependence, unspecified, uncomplicated: Secondary | ICD-10-CM

## 2023-10-15 DIAGNOSIS — D869 Sarcoidosis, unspecified: Secondary | ICD-10-CM

## 2023-10-15 MED ORDER — VARENICLINE TARTRATE (STARTER) 0.5 MG X 11 & 1 MG X 42 PO TBPK: ORAL_TABLET | ORAL | 0 refills | Status: DC

## 2023-10-15 NOTE — Patient Instructions (Signed)
    [facility]   Address: [address], [place], [place] [address]   Phone: [phone]

## 2023-10-15 NOTE — Progress Notes (Signed)
Careteam: Patient Care Team: Sharon Seller, NP as PCP - General (Geriatric Medicine) [name], MD as PCP - Cardiology (Cardiology) Elmon Else, MD as Consulting Physician (Dermatology)  PLACE OF SERVICE:  [facility]  Advanced Directive information    Allergies  Allergen Reactions   Codeine Other (See Comments)    Reaction unknown   Cyclinex [Tetracycline Hcl] Other (See Comments)    Reaction unknown, all "cyclines"   Milk Thistle Hives   Morphine And Codeine Other (See Comments)    Reaction unknown    Chief Complaint  Patient presents with   Medical Management of Chronic Issues    6 month follow-up. Discuss need for shingrix, pap, covid booster, and flu vaccine.      HPI: Patient is a 57 y.o. female for routine follow up.    Htn- does not check blood pressure at home. Did not take her medication this morning.   COPD- not doing as well since she has started to smoke. Now on brestri and doing better. Decrease in wheezing since she is on this.   Continues to smoke, her mother passed away in 07-14-2023 and she restarted again.  She is smoking a lot.   She was her moms primary caregiver and now that she has passed she is sitting around more and eating.  Now she has transportation.   She is planning to get a mammogram.   Would like GYN to do PAP   Review of Systems:  Review of Systems  Constitutional:  Negative for chills, fever and weight loss.  HENT:  Negative for tinnitus.   Respiratory:  Negative for cough, sputum production and shortness of breath.   Cardiovascular:  Negative for chest pain, palpitations and leg swelling.  Gastrointestinal:  Negative for abdominal pain, constipation, diarrhea and heartburn.  Genitourinary:  Negative for dysuria, frequency and urgency.  Musculoskeletal:  Positive for myalgias. Negative for back pain, falls and joint pain.  Skin: Negative.   Neurological:  Negative for dizziness and headaches.   Psychiatric/Behavioral:  Negative for depression and memory loss. The patient does not have insomnia.     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 bx. left intranasal papilloma   OTHER SURGICAL HISTORY  2010   cyst removal from right middle finger     REMOVAL OF GROWTH FROM FINGER  DR Metro Kung   VIDEO BRONCHOSCOPY WITH ENDOBRONCHIAL ULTRASOUND  03/01/2012   Social History:   reports that she has been smoking e-cigarettes. She has been exposed to tobacco smoke. She has never used smokeless tobacco. She reports current alcohol use. She reports that she does not use drugs.  Family History  Problem Relation Age of Onset   Hyperlipidemia Mother    Stroke Mother    Arthritis Mother    Depression Mother    Heart disease Father    Hypertension Father    Cancer Father    Cancer Maternal Aunt        mother's aunt   Diabetes Maternal Aunt     Medications: Patient's Medications  New  Prescriptions   No medications on file  Previous Medications   ACETAMINOPHEN (TYLENOL) 500 MG TABLET    Take 500 mg by mouth every 6 (six) hours as needed. For pain   ALBUTEROL (VENTOLIN HFA) 108 (90 BASE) MCG/ACT INHALER    Inhale 2 puffs into the lungs every 6 (six) hours as needed for wheezing or shortness of breath.   ASPIRIN EC 81 MG TABLET    Take 81 mg by mouth daily. Swallow whole.   BUDESON-GLYCOPYRROL-FORMOTEROL (BREZTRI AEROSPHERE) 160-9-4.8 MCG/ACT AERO    Inhale 2 puffs into the lungs in the morning and at bedtime.   BUDESON-GLYCOPYRROL-FORMOTEROL (BREZTRI AEROSPHERE) 160-9-4.8 MCG/ACT AERO    Inhale 2 puffs into the lungs in the morning and at bedtime.   BUDESON-GLYCOPYRROL-FORMOTEROL (BREZTRI AEROSPHERE) 160-9-4.8 MCG/ACT AERO    Inhale 2 puffs into the lungs in the morning and at bedtime. 1610-9604-54 Lot [id number] Exp 2/27   BUDESONIDE-FORMOTEROL (SYMBICORT) 160-4.5 MCG/ACT INHALER    Inhale 2 puffs into the lungs 2 (two) times daily.   BUPROPION (WELLBUTRIN SR) 150 MG 12 HR TABLET    Take 1 tablet (150 mg total) by mouth 2 (two) times daily.   CALCIUM CARBONATE (OS-CAL - DOSED IN MG OF ELEMENTAL CALCIUM) 1250 (500 CA) MG TABLET    Take 1 tablet by mouth.   CEPHALEXIN (KEFLEX) 500 MG CAPSULE    Take 1,000 mg by mouth daily.   CLINDAMYCIN (CLEOCIN T) 1 % LOTION    Apply 1 application  topically daily.   DICLOFENAC SODIUM (VOLTAREN) 1 % GEL    Apply 4 g topically as needed.   FAMOTIDINE (PEPCID) 20 MG TABLET    Take 1 tablet (20 mg total) by mouth 2 (two) times daily before a meal.   FLUTICASONE (FLONASE) 50 MCG/ACT NASAL SPRAY    Place 1 spray into both nostrils daily.   FUROSEMIDE (LASIX) 40 MG TABLET    Take one tablet 1-2 times per week as needed for swelling.   GLUCOSAMINE HCL PO    Take 1 tablet by mouth 2 (two) times daily.   HYDROCORTISONE 2.5 % CREAM    Apply 1 application topically 2 (two) times daily as needed for rash.   IPRATROPIUM-ALBUTEROL (DUONEB) 0.5-2.5 (3)  MG/3ML SOLN    Take 3 mLs by nebulization every 4 (four) hours as needed (shortness of breath  (D86.0, J44.9)).   LOSARTAN-HYDROCHLOROTHIAZIDE (HYZAAR) 100-12.5 MG TABLET    TAKE 1 TABLET BY MOUTH DAILY   METFORMIN (GLUCOPHAGE-XR) 750 MG 24 HR TABLET    Take 750 mg by mouth daily.   MONTELUKAST (SINGULAIR) 10 MG TABLET    Take 1 tablet (10 mg total) by mouth at bedtime.   OMEGA-3 FISH OIL (MAXEPA) 1000 MG CAPS CAPSULE    Take by  mouth.   POTASSIUM CHLORIDE SA (KLOR-CON) 20 MEQ TABLET    Take one tablet 1-2 times per week as needed.  Take with Lasix.   ROSUVASTATIN (CRESTOR) 40 MG TABLET    Take 1 tablet (40 mg total) by mouth daily.   SERTRALINE (ZOLOFT) 25 MG TABLET    TAKE 1 TABLET(25 MG) BY MOUTH DAILY   TIOTROPIUM BROMIDE MONOHYDRATE (SPIRIVA RESPIMAT) 2.5 MCG/ACT AERS    Inhale 2 puffs into the lungs daily.   TRAMADOL (ULTRAM) 50 MG TABLET    Take 1 tablet (50 mg total) by mouth every 12 (twelve) hours as needed for moderate pain.   TURMERIC PO    Take 538 mg by mouth 2 (two) times a day.    VARENICLINE (CHANTIX CONTINUING MONTH PAK) 1 MG TABLET    Take 1 tablet (1 mg total) by mouth 2 (two) times daily.   VITAMIN B-12 (CYANOCOBALAMIN) 100 MCG TABLET    Take 200 mcg by mouth daily. 1000 x2   VITAMIN D PO    Take by mouth.  Modified Medications   No medications on file  Discontinued Medications   No medications on file    Physical Exam:  Vitals:   10/15/23 0811 10/15/23 0817  BP: (!) 148/90 (!) 148/92  Pulse: 82   Resp: 20   Temp: 98.6 F (37 C)   SpO2: 98%   Weight: (!) 314 lb 6.4 oz (142.6 kg)   Height: 5\' 6"  (1.676 m)    Body mass index is 50.75 kg/m. Wt Readings from Last 3 Encounters:  10/15/23 (!) 314 lb 6.4 oz (142.6 kg)  04/09/23 (!) 318 lb (144.2 kg)  02/22/23 (!) 313 lb 3.2 oz (142.1 kg)    Physical Exam Constitutional:      General: She is not in acute distress.    Appearance: She is well-developed. She is not diaphoretic.  HENT:     Head: Normocephalic  and atraumatic.     Mouth/Throat:     Pharynx: No oropharyngeal exudate.  Eyes:     Conjunctiva/sclera: Conjunctivae normal.     Pupils: Pupils are equal, round, and reactive to light.  Cardiovascular:     Rate and Rhythm: Normal rate and regular rhythm.     Heart sounds: Normal heart sounds.  Pulmonary:     Effort: Pulmonary effort is normal.     Breath sounds: Normal breath sounds.  Abdominal:     General: Bowel sounds are normal.     Palpations: Abdomen is soft.  Musculoskeletal:     Cervical back: Normal range of motion and neck supple.     Right lower leg: No edema.     Left lower leg: No edema.  Skin:    General: Skin is warm and dry.  Neurological:     Mental Status: She is alert.  Psychiatric:        Mood and Affect: Mood normal.     Labs reviewed: Basic Metabolic Panel: Recent Labs    04/09/23 1213 05/11/23 1347 09/14/23 0000  NA 141 140 137  K 4.1 3.9 4.1  CL 105 106 101  CO2 28 28 29*  GLUCOSE 96 124*  --   BUN 9 11 13   CREATININE 0.94 0.88 1.1  CALCIUM 9.3 8.9 9.3   Liver Function Tests: Recent Labs    04/09/23 1213 05/11/23 1347 09/14/23 0000  AST 15 12 15   ALT 10 7 10   ALKPHOS  --   --  130*  BILITOT 0.4  0.3  --   PROT 7.2 6.5  --   ALBUMIN  --   --  3.7   No results for input(s): "LIPASE", "AMYLASE" in the last 8760 hours. No results for input(s): "AMMONIA" in the last 8760 hours. CBC: Recent Labs    04/09/23 1213 05/01/23 1429 09/14/23 0000  WBC 9.9 10.3 10.9  NEUTROABS 7,405 7,159 7,957.00  HGB 13.0 13.0 14.4  HCT 40.2 39.6 44  MCV 92.6 92.5  --   PLT 243 242 256   Lipid Panel: Recent Labs    04/09/23 1213  CHOL 127  HDL 51  LDLCALC 60  TRIG 79  CHOLHDL 2.5   TSH: No results for input(s): "TSH" in the last 8760 hours. A1C: Lab Results  Component Value Date   HGBA1C 5.4 12/28/2016     Assessment/Plan 1. Hidradenitis suppurativa (Primary) Continues to follow up with dermatology - BASIC METABOLIC PANEL WITH  GFR  2. COPD with chronic bronchitis (HCC) -stable, encouraged smoking cessation to avoid progressive disease  3. Benign essential HTN --Blood pressure elevated today but typically well controlled -home blood pressures are well controlled -No changes to medications today  -will have pt continue to monitor home bp goal <140/90, to notify if readings remain high on 3 different days  -follow metabolic panel - BASIC METABOLIC PANEL WITH GFR  4. Mixed hyperlipidemia -stable on crestor.   6. Smoker Encouraged cessation  - Varenicline Tartrate, Starter, (CHANTIX STARTING MONTH PAK) 0.5 MG X 11 & 1 MG X 42 TBPK; Take as directed  Dispense: 53 each; Refill: 0  7. Sarcoidosis Followed by pulmonary, smoking cessation encouraged - BASIC METABOLIC PANEL WITH GFR  8. Depression, major, single episode, moderate (HCC) -stable, continues on Wellbutrin and zoloft.    Return in about 6 months (around 04/14/2024) for CPE.  Janene Harvey. Biagio Borg [facility] [phone]

## 2023-10-16 ENCOUNTER — Ambulatory Visit: Payer: Medicaid Other | Admitting: Nurse Practitioner

## 2023-10-16 LAB — BASIC METABOLIC PANEL WITH GFR
BUN: 11 mg/dL (ref 7–25)
CO2: 29 mmol/L (ref 20–32)
Calcium: 9.6 mg/dL (ref 8.6–10.4)
Chloride: 101 mmol/L (ref 98–110)
Creat: 0.97 mg/dL (ref 0.50–1.03)
Glucose, Bld: 99 mg/dL (ref 65–99)
Potassium: 4.6 mmol/L (ref 3.5–5.3)
Sodium: 139 mmol/L (ref 135–146)
eGFR: 68 mL/min/{1.73_m2} (ref >=60)

## 2024-01-14 ENCOUNTER — Other Ambulatory Visit: Payer: Self-pay | Admitting: Nurse Practitioner

## 2024-01-14 DIAGNOSIS — F3341 Major depressive disorder, recurrent, in partial remission: Secondary | ICD-10-CM

## 2024-01-14 MED ORDER — BUPROPION HCL ER (SR) 150 MG PO TB12: 150.0000 mg | ORAL_TABLET | Freq: Two times a day (BID) | ORAL | 5 refills | Status: DC

## 2024-01-14 NOTE — Telephone Encounter (Signed)
 Last Fill: 12/11/22  Last OV: 10/15/23 Next OV: 01/25/24  Routing to provider for review/authorization.

## 2024-01-14 NOTE — Telephone Encounter (Signed)
 Copied from CRM [id number]. Topic: Clinical - Medication Refill >> Jan 14, 2024  3:11 PM Philippa Chester F wrote: Most Recent Primary Care Visit:  Provider: Sharon Seller  Department: [facility]  Visit Type: OFFICE VISIT  Date: 10/15/2023   Patient would like the listed prescription but would like the prescription to include [employer] brand in the prescription so that the patient receives that specific brand.    Medication: buPROPion (WELLBUTRIN SR) 150 MG 12 hr tablet  Has the patient contacted their pharmacy? Yes  Is this the correct pharmacy for this prescription? Yes  This is the patient's preferred pharmacy:  [id number] - [place], [place] - [address] AT [facility] OF [address] [place] [address] Phone: [phone] Fax: [phone]   Has the prescription been filled recently? No  Is the patient out of the medication? No, patient has a few more days left. 4 days left.  Has the patient been seen for an appointment in the last year OR does the patient have an upcoming appointment? Yes  Can we respond through MyChart? Yes  Agent: Please be advised that Rx refills may take up to 3 business days. We ask that you follow-up with your pharmacy.

## 2024-01-15 NOTE — Telephone Encounter (Signed)
 Per patients request this has been faxed to dermatologist.  Copied from CRM [id number]. Topic: General - Other >> Jan 14, 2024  3:04 PM Brittney F wrote: Reason for CRM:   Patient would like her latest Kidney function test results to be faxed over to her dermatologist:   Dermatologist:   Dr. Christianne Dolin  [facility] [address] [place], [place] [address]  Phone [phone]  Fax [phone]

## 2024-01-25 ENCOUNTER — Encounter: Payer: Self-pay | Admitting: Nurse Practitioner

## 2024-01-25 ENCOUNTER — Ambulatory Visit (INDEPENDENT_AMBULATORY_CARE_PROVIDER_SITE_OTHER): Admitting: Nurse Practitioner

## 2024-01-25 DIAGNOSIS — F17200 Nicotine dependence, unspecified, uncomplicated: Secondary | ICD-10-CM | POA: Diagnosis not present

## 2024-01-25 DIAGNOSIS — I700 Atherosclerosis of aorta: Secondary | ICD-10-CM

## 2024-01-25 LAB — HEMOGLOBIN A1C
Hgb A1c MFr Bld: 6.1 %{Hb} — ABNORMAL HIGH (ref <5.7)
Mean Plasma Glucose: 128 mg/dL
eAG (mmol/L): 7.1 mmol/L

## 2024-01-25 MED ORDER — VARENICLINE TARTRATE (STARTER) 0.5 MG X 11 & 1 MG X 42 PO TBPK: ORAL_TABLET | ORAL | 0 refills | Status: DC

## 2024-01-25 MED ORDER — WEGOVY 0.25 MG/0.5ML ~~LOC~~ SOAJ: 0.2500 mg | SUBCUTANEOUS | 1 refills | Status: DC

## 2024-01-25 NOTE — Progress Notes (Addendum)
 Careteam: Patient Care Team: Verma Gobble, NP as PCP - General (Geriatric Medicine) Elmyra Haggard, MD as PCP - Cardiology (Cardiology) Thais Fill, MD as Consulting Physician (Dermatology)   PLACE OF SERVICE: [facility]  Advanced Directive information     Allergies  Allergen Reactions   Codeine Other (See Comments)    Reaction unknown   Cyclinex [Tetracycline Hcl] Other (See Comments)    Reaction unknown, all "cyclines"   Milk Thistle Hives   Morphine  And Codeine Other (See Comments)    Reaction unknown     Chief Complaint  Patient presents with   Acute Visit    Obesity Management      HPI: Patient is a 58 y.o. female presents for acute visit.   Has been trying to lose weight for many years without success. PMH COPD and sarcoidosis. Shortness of breath with exertion, followed by pulmonary. Using PRN Duoneb. Using recumbent bike daily as recommended by pulmonologist.  Trying to make healthier options food wise, cutting out some dairy.  Reports joint pain on left hip and shortness of breath makes it hard to exercise more frequently. Plans to start walking her dogs more now that weather is warming up.   Wants to try weight and wellness program. Reports her insurance will cover Wegovy  and wants to try it as well.   Still smoking, 5-10 cigarettes. Has tried Chantix  with some success, but would like to try again. States she doesn't have the will power to stop on her own.   Review of Systems:  Review of Systems  Constitutional:  Negative for chills, fever and weight loss.  Respiratory:  Positive for cough ("normal amount of coughing") and shortness of breath (with exertion). Negative for wheezing.   Cardiovascular:  Negative for chest pain, palpitations and leg swelling.  Gastrointestinal:  Negative for constipation, diarrhea, nausea and vomiting.  Genitourinary:  Negative for dysuria, frequency and urgency.  Musculoskeletal:  Positive for joint pain.  Neurological:   Negative for dizziness, weakness and headaches.  Psychiatric/Behavioral:  Positive for depression and substance abuse (tobacco). The patient is not nervous/anxious and does not have insomnia.      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 bx. left intranasal papilloma   OTHER SURGICAL HISTORY  2010   cyst removal from right middle finger     REMOVAL OF GROWTH  FROM FINGER     DR MEYERDIERKS   VIDEO BRONCHOSCOPY WITH ENDOBRONCHIAL ULTRASOUND  03/01/2012     Social History:   reports that she has been smoking e-cigarettes. She has been exposed to tobacco smoke. She has never used smokeless tobacco. She reports current alcohol use. She reports that she does not use drugs.  Family History  Problem Relation Age of Onset   Hyperlipidemia Mother    Stroke Mother    Arthritis Mother    Depression Mother    Heart disease Father    Hypertension Father    Cancer Father    Cancer Maternal Aunt         mother's aunt   Diabetes Maternal Aunt      Medications:  Patient's Medications  New Prescriptions   SEMAGLUTIDE -WEIGHT MANAGEMENT (WEGOVY ) 0.25 MG/0.5ML SOAJ    Inject 0.25 mg into the skin once a week.  Previous Medications   ACETAMINOPHEN  (TYLENOL ) 500 MG TABLET    Take 500 mg by mouth every 6 (six) hours as needed. For pain   ALBUTEROL  (VENTOLIN  HFA) 108 (90 BASE) MCG/ACT INHALER    Inhale 2 puffs into the lungs every 6 (six) hours as needed for wheezing or shortness of breath.   ASPIRIN  EC 81 MG TABLET    Take 81 mg by mouth daily. Swallow whole.   BUDESON-GLYCOPYRROL-FORMOTEROL  (BREZTRI  AEROSPHERE) 160-9-4.8 MCG/ACT AERO    Inhale 2 puffs into the lungs in the morning and at bedtime.   BUPROPION  (WELLBUTRIN  SR) 150 MG 12 HR TABLET    Take 1 tablet (150 mg total) by mouth 2 (two) times daily.   CALCIUM  CARBONATE (OS-CAL - DOSED IN MG OF ELEMENTAL CALCIUM ) 1250 (500 CA) MG TABLET    Take 1 tablet by mouth.   CEPHALEXIN (KEFLEX) 500 MG CAPSULE    Take 1,000 mg by mouth daily.   CLINDAMYCIN (CLEOCIN T) 1 % LOTION    Apply 1 application  topically daily.   DICLOFENAC  SODIUM (VOLTAREN ) 1 % GEL    Apply 4 g topically as needed.   FAMOTIDINE  (PEPCID ) 20 MG TABLET    Take 1 tablet (20 mg total) by mouth 2 (two) times daily before a meal.   FLUTICASONE  (FLONASE ) 50 MCG/ACT NASAL SPRAY    Place 1 spray into both nostrils daily.   FUROSEMIDE  (LASIX ) 40 MG TABLET    Take one tablet 1-2 times per week as needed for swelling.   GLUCOSAMINE HCL PO    Take 1 tablet by mouth 2 (two) times daily.   HYDROCORTISONE 2.5 % CREAM    Apply 1 application topically 2 (two) times daily as needed for rash.   IPRATROPIUM-ALBUTEROL  (DUONEB) 0.5-2.5 (3) MG/3ML SOLN    Take 3 mLs by nebulization every 4 (four) hours as needed (shortness of breath  (D86.0, J44.9)).   LOSARTAN -HYDROCHLOROTHIAZIDE (HYZAAR) 100-12.5 MG TABLET    TAKE 1 TABLET BY MOUTH DAILY   METFORMIN (GLUCOPHAGE-XR) 750 MG 24 HR TABLET    Take 750 mg by  mouth daily.   MONTELUKAST  (SINGULAIR ) 10 MG TABLET    Take 1 tablet (10 mg total) by mouth at bedtime.   OMEGA-3 FISH OIL (MAXEPA) 1000 MG CAPS CAPSULE    Take by mouth.   POTASSIUM CHLORIDE  SA (KLOR-CON ) 20 MEQ TABLET    Take one tablet 1-2 times per week as needed.  Take with Lasix .   ROSUVASTATIN  (CRESTOR ) 40 MG TABLET    Take 1 tablet (40 mg total) by mouth daily.  SERTRALINE  (ZOLOFT ) 25 MG TABLET    TAKE 1 TABLET(25 MG) BY MOUTH DAILY   TRAMADOL  (ULTRAM ) 50 MG TABLET    Take 1 tablet (50 mg total) by mouth every 12 (twelve) hours as needed for moderate pain.   TURMERIC PO    Take 538 mg by mouth 2 (two) times a day.    VITAMIN B-12 (CYANOCOBALAMIN) 100 MCG TABLET    Take 200 mcg by mouth daily. 1000 x2   VITAMIN D PO    Take by mouth.  Modified Medications   Modified Medication Previous Medication   VARENICLINE  TARTRATE, STARTER, (CHANTIX  STARTING MONTH PAK) 0.5 MG X 11 & 1 MG X 42 TBPK Varenicline  Tartrate, Starter, (CHANTIX  STARTING MONTH PAK) 0.5 MG X 11 & 1 MG X 42 TBPK      Take as directed    Take as directed  Discontinued Medications   No medications on file     Physical Exam:  Vitals:   01/25/24 1035  BP: 128/78  Pulse: 82  Resp: 20  Temp: (!) 97.4 F (36.3 C)  SpO2: 98%  Weight: (!) 315 lb (142.9 kg)  Height: 5\' 6"  (1.676 m)   Body mass index is 50.84 kg/m.  Wt Readings from Last 3 Encounters:  01/25/24 (!) 315 lb (142.9 kg)  10/15/23 (!) 314 lb 6.4 oz (142.6 kg)  04/09/23 (!) 318 lb (144.2 kg)     Physical Exam Constitutional:      Appearance: Normal appearance. She is obese.  Cardiovascular:     Rate and Rhythm: Normal rate and regular rhythm.     Pulses: Normal pulses.     Heart sounds: Normal heart sounds.  Pulmonary:     Effort: Pulmonary effort is normal.     Breath sounds: Normal breath sounds.  Abdominal:     General: Bowel sounds are normal.     Palpations: Abdomen is soft.  Musculoskeletal:     Right lower leg: No edema.     Left lower  leg: No edema.  Skin:    General: Skin is warm and dry.  Neurological:     General: No focal deficit present.     Mental Status: She is alert and oriented to person, place, and time. Mental status is at baseline.  Psychiatric:        Mood and Affect: Mood normal.        Behavior: Behavior normal.     Labs reviewed: Basic Metabolic Panel:  Recent Labs    04/09/23 1213 05/11/23 1347 09/14/23 0000 10/15/23 0917  NA 141 140 137 139  K 4.1 3.9 4.1 4.6  CL 105 106 101 101  CO2 28 28 29* 29  GLUCOSE 96 124*  --  99  BUN 9 11 13 11   CREATININE 0.94 0.88 1.1 0.97  CALCIUM  9.3 8.9 9.3 9.6   Liver Function Tests:  Recent Labs    04/09/23 1213 05/11/23 1347 09/14/23 0000  AST 15 12 15   ALT 10 7 10   ALKPHOS  --   --  130*  BILITOT 0.4 0.3  --   PROT 7.2 6.5  --   ALBUMIN  --   --  3.7   No results for input(s): "LIPASE", "AMYLASE" in the last 8760 hours. No results for input(s): "AMMONIA" in the last 8760 hours. CBC:  Recent Labs    04/09/23 1213 05/01/23 1429 09/14/23 0000  WBC 9.9 10.3 10.9  NEUTROABS 7,405 7,159 7,957.00  HGB 13.0 13.0 14.4  HCT  40.2 39.6 44  MCV 92.6 92.5  --   PLT 243 242 256   Lipid Panel:  Recent Labs    04/09/23 1213  CHOL 127  HDL 51  LDLCALC 60  TRIG 79  CHOLHDL 2.5   TSH: No results for input(s): "TSH" in the last 8760 hours. A1C:  Lab Results  Component Value Date   HGBA1C 5.4 12/28/2016     Assessment/Plan   1. Morbid obesity (HCC) (Primary) -BMI 50.84 -Encouraged limiting fat, salt, carbohydrates/sugars, fried food intake -Encouraged physical activity as tolerated - Semaglutide -Weight Management (WEGOVY ) 0.25 MG/0.5ML SOAJ; Inject 0.25 mg into the skin once a week.  Dispense: 2 mL; Refill: 1 - Hemoglobin A1c - Amb Ref to Medical Weight Management - she plans to continue dietary and lifestyle modification (exercise and increase in physical activity)  with referral to weight and wellness to help with weight loss along with  medication  She will not be taking in combination with any GLP-1 -she has hx of aortic aortic atherosclerosis noted on imaging from 08/14/2022 along with CAD   2. Smoker -Encouraged smoking cessation; set realistic goals - Varenicline  Tartrate, Starter, (CHANTIX  STARTING MONTH PAK) 0.5 MG X 11 & 1 MG X 42 TBPK; Take as directed  Dispense: 53 each; Refill: 0 Can continue chantix  once completes starter pack- to call office to request refill once starts.   3. Aortic atherosclerosis (HCC) Noted on imaging Continues on ASA and statin      Return in about 3 months (around 04/26/2024) for routine follow up.  Michelle Zavaleta Moreno, Lenis Quin MSN-FNP Student -I personally was present during the history, physical exam and medical decision-making activities of this service and have verified that the service and findings are accurately documented in the student's note Nargis Abrams K. Denney Fisherman [facility] [phone]

## 2024-01-28 ENCOUNTER — Encounter (INDEPENDENT_AMBULATORY_CARE_PROVIDER_SITE_OTHER): Payer: Self-pay

## 2024-01-28 ENCOUNTER — Encounter: Payer: Self-pay | Admitting: Nurse Practitioner

## 2024-01-28 ENCOUNTER — Other Ambulatory Visit: Payer: Self-pay | Admitting: Nurse Practitioner

## 2024-01-28 DIAGNOSIS — F321 Major depressive disorder, single episode, moderate: Secondary | ICD-10-CM

## 2024-01-29 ENCOUNTER — Telehealth: Payer: Self-pay

## 2024-01-29 NOTE — Telephone Encounter (Signed)
 Kaitlyn [name] (Key: [id number]) Rx #: [id number] 0.25MG /0.5ML auto-injectors Form [employer] Electronic Prior Authorization Form (2017 [place]CP[employer])  Wait for Determination Please wait for [employer] 2017 NCP[employer] to return a determination.

## 2024-01-31 NOTE — Telephone Encounter (Signed)
 I called the number provided and was told that a fax was sent to [place] and we need to attach records, answer questions and return fax within 24 hours. I also checked covermymeds and there was an indication that PA was denied, however the [facility] representative instructed me to complete form and decision can be reversed if records indicate need for medication.   Fax received, questions answered, office notes and labs faxed to [phone]   Mychart message sent to patient with status update

## 2024-01-31 NOTE — Telephone Encounter (Signed)
 Copied from CRM [id number]. Topic: Clinical - Prescription Issue >> Jan 31, 2024  9:36 AM Hector Shade B wrote: Reason for CRM: Patient call in with her insurance company on the line to get prior authorization for Semaglutide-Weight Management [facility]) 0.25 MG/0.5ML SOAJ, patient stated she wanted the provider to speak with the insurance company now. I advise that the provider was with patients at this time, but I'd be happy to pass on the information have the call returned to [facility] Prior Authorization line. [phone].

## 2024-02-19 ENCOUNTER — Encounter (INDEPENDENT_AMBULATORY_CARE_PROVIDER_SITE_OTHER): Payer: Self-pay

## 2024-02-21 ENCOUNTER — Telehealth: Payer: Self-pay | Admitting: Nurse Practitioner

## 2024-02-21 NOTE — Telephone Encounter (Signed)
 Spoke with Chief Technology Officer from [facility] in regards to the Medication prior authorization for Wegovy  which was denied twice the next step is an appeal by Eubanks, Jessica K, NP in order for medication to be approve. See the Triage Notes from today along from Triage Nurse. Please Advise   Message sent to Verma Gobble, NP

## 2024-02-21 NOTE — Telephone Encounter (Signed)
 Copied from CRM [id number]. Topic: Clinical - Prescription Issue >> Jan 31, 2024  9:36 AM Arlie Benedict B wrote: Reason for CRM: Patient call in with her insurance company on the line to get prior authorization for Semaglutide -Weight Management (WEGOVY ) 0.25 MG/0.5ML SOAJ, patient stated she wanted the provider to speak with the insurance company now. I advise that the provider was with patients at this time, but I'd be happy to pass on the information have the call returned to [facility] Prior Authorization line. [phone]. >> Feb 21, 2024  1:59 PM Retta Caster wrote: Amira from [facility] calling on update for Prior Auth for Semaglutide -Weight Management (WEGOVY ) 0.25 MG/0.5ML [place] [[id number]] Needs office to call [phone] Prior Auth Dept refer to Ref# [id number]

## 2024-02-22 NOTE — Telephone Encounter (Signed)
 Updated case number [id number]  Fax number [phone] Please send updated chart note

## 2024-02-25 ENCOUNTER — Telehealth: Payer: Self-pay

## 2024-02-25 NOTE — Telephone Encounter (Signed)
 Patient insurance company faxed over request of patient previous office visit to process PA. They gave notice of 24hrs to fax information. This fax came through at 7:36pm on Friday 02/22/2024. We were unable to meet that request. I have faxed over requested information with a fax cover sheet stating that we weren't open and fax didn't allow us  enough time.

## 2024-02-27 ENCOUNTER — Encounter (INDEPENDENT_AMBULATORY_CARE_PROVIDER_SITE_OTHER): Admitting: Internal Medicine

## 2024-02-27 ENCOUNTER — Encounter (INDEPENDENT_AMBULATORY_CARE_PROVIDER_SITE_OTHER): Payer: Self-pay | Admitting: Internal Medicine

## 2024-02-27 ENCOUNTER — Ambulatory Visit (INDEPENDENT_AMBULATORY_CARE_PROVIDER_SITE_OTHER): Admitting: Internal Medicine

## 2024-02-27 VITALS — BP 128/84 | HR 78 | Temp 98.1°F | Ht 66.0 in | Wt 306.0 lb

## 2024-02-27 DIAGNOSIS — E66813 Obesity, class 3: Secondary | ICD-10-CM | POA: Insufficient documentation

## 2024-02-27 DIAGNOSIS — J449 Chronic obstructive pulmonary disease, unspecified: Secondary | ICD-10-CM | POA: Diagnosis not present

## 2024-02-27 DIAGNOSIS — D869 Sarcoidosis, unspecified: Secondary | ICD-10-CM

## 2024-02-27 DIAGNOSIS — Z0289 Encounter for other administrative examinations: Secondary | ICD-10-CM

## 2024-02-27 DIAGNOSIS — J4489 Other specified chronic obstructive pulmonary disease: Secondary | ICD-10-CM

## 2024-02-27 DIAGNOSIS — L732 Hidradenitis suppurativa: Secondary | ICD-10-CM

## 2024-02-27 DIAGNOSIS — R7303 Prediabetes: Secondary | ICD-10-CM | POA: Insufficient documentation

## 2024-02-27 DIAGNOSIS — I10 Essential (primary) hypertension: Secondary | ICD-10-CM | POA: Diagnosis not present

## 2024-02-27 DIAGNOSIS — E782 Mixed hyperlipidemia: Secondary | ICD-10-CM

## 2024-02-27 DIAGNOSIS — E785 Hyperlipidemia, unspecified: Secondary | ICD-10-CM

## 2024-02-27 DIAGNOSIS — E669 Obesity, unspecified: Secondary | ICD-10-CM

## 2024-02-27 DIAGNOSIS — Z6841 Body Mass Index (BMI) 40.0 and over, adult: Secondary | ICD-10-CM

## 2024-02-27 NOTE — Progress Notes (Signed)
 Office: [phone]  /  Fax: [phone]   Initial Visit  Kaitlyn [name] was seen in clinic today to evaluate for obesity. She is interested in losing weight to improve overall health and reduce the risk of weight related complications. She presents today to review program treatment options, initial physical assessment, and evaluation.     Discussed the use of AI scribe software for clinical note transcription with the patient, who gave verbal consent to proceed.    History of Present Illness   Kaitlyn [name] is a 58 year old female with obesity who presents for an introductory visit for medical weight management.  She has experienced weight gain since after high school, attributing it to life changes such as her parents' divorce and starting college. She has attempted weight loss through various methods including phentermine , which she took twice for about five to six months each time, resulting in weight loss that was regained after stopping the medication. She also tried Weight Watchers with some success.  Her weight has affected her self-esteem, mood, and has contributed to joint pain. She experiences fatigue and is not always in the best of moods. She has not been tested for sleep apnea but reports her A1c was a little high, indicating prediabetes. She denies waking up gasping for air or choking, but does not feel rested upon waking and occasionally wakes up with a headache.  Her current medications include losartan  and hydrochlorothiazide for blood pressure, metformin for prediabetes and hidradenitis suppurativa, Crestor  40 mg for cholesterol, and Chantix  as she is attempting to quit smoking. She previously quit smoking about ten years ago but resumed due to social influences.  She consumes processed foods and gets about five hours of sleep per night, often watching TV late at night. She does not follow a specific nutrition plan and describes her physical activity level as minimal due  to breathing difficulties related to COPD. She experiences fatigue with minimal exertion, such as walking across the floor.  Family history includes a brother who is overweight. She has no children. She lives in [place], which is about a 20-minute drive from the clinic.       She was referred by: PCP  When asked what else they would like to accomplish? She states: Improve existing medical conditions and Improve quality of life  When asked how has your weight affected you? She states: Has affected self-esteem, Contributed to orthopedic problems or mobility issues, Having fatigue, Having poor endurance, and Has affected mood     Some associated conditions: Hypertension, Hyperlipidemia, Prediabetes, Lung disease, and Other: HS  Contributing factors: Family history of obesity, Disruption of circadian rhythm / sleep disordered breathing, Consumption of processed foods, and Use of obesogenic medications: Steroids  Weight promoting medications identified: Steroids  Current nutrition plan: None  Current level of physical activity: Low levels of physical activity at present  Current or previous pharmacotherapy: Phentermine  on two ocassion, used 5-6 months, with weight regain, weight watchers lost weight  Response to medication: Lost weight initially but was unable to sustain weight loss   Past medical history includes:   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   PFT 03/13/12>>FEV1 1.65 (57%), FEV1% 58, TLC 4.44 (79%), DLCO 62%, no BD    Depression    Extrinsic asthma, unspecified    Hidradenitis  Hyperlipidemia    takes pravachol    Hypertension    Keloid scar    Obesity    Osteoarthritis    Osteoarthrosis, unspecified whether generalized or localized, unspecified site    PONV (postoperative nausea and vomiting)    Pulmonary 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  Peak Weight:318 , Body Fat%:51, Visceral Fat Rating:19, Weight trend over the last 12 months: Decreasing  General:  Alert, oriented and cooperative. Patient is in no acute distress.  Respiratory: Normal respiratory effort, no problems with respiration noted   Gait: able to ambulate independently  Mental Status: Normal mood and affect. Normal behavior. Normal judgment and thought content.   DIAGNOSTIC DATA REVIEWED:  BMET    Component Value Date/Time   NA 139 10/15/2023 0917   NA 137 09/14/2023 0000   K 4.6 10/15/2023 0917   CL 101 10/15/2023 0917   CO2 29 10/15/2023 0917   GLUCOSE 99 10/15/2023 0917   BUN 11 10/15/2023 0917   BUN 13 09/14/2023 0000   CREATININE 0.97 10/15/2023 0917   CALCIUM  9.6 10/15/2023 0917   GFRNONAA 64 12/28/2016 1013   GFRAA 74 12/28/2016 1013   Lab Results  Component Value Date   HGBA1C 6.1 (H) 01/25/2024   HGBA1C 5.9 (H) 04/08/2013   No results found for: "INSULIN" CBC    Component Value Date/Time   WBC 10.9 09/14/2023 0000   WBC 10.3 05/01/2023 1429   RBC 4.79 09/14/2023 0000   HGB 14.4 09/14/2023 0000   HGB 13.9 02/25/2016 0929   HCT 44 09/14/2023 0000   HCT 43.0 02/25/2016 0929   PLT 256 09/14/2023 0000   PLT 237 02/25/2016 0929   MCV 92.5 05/01/2023 1429   MCV 94 02/25/2016 0929   MCH 30.4 05/01/2023 1429   MCHC 32.8 05/01/2023 1429   RDW 13.1 05/01/2023 1429   RDW 14.6 02/25/2016 0929   Iron/TIBC/Ferritin/ %Sat    Component Value  Date/Time   IRON 46 01/31/2018 0000   Lipid Panel     Component Value Date/Time   CHOL 127 04/09/2023 1213   CHOL 167 06/09/2019 1417   TRIG 79 04/09/2023 1213   HDL 51 04/09/2023 1213   HDL 59 06/09/2019 1417   CHOLHDL 2.5 04/09/2023 1213   VLDL 19 12/28/2016 1013   LDLCALC 60 04/09/2023 1213   Hepatic Function Panel     Component Value Date/Time   PROT 6.5 05/11/2023 1347   PROT 7.6 02/25/2016 0929   ALBUMIN 3.7 09/14/2023 0000   ALBUMIN 3.8 02/25/2016 0929   AST 15 09/14/2023 0000   ALT 10 09/14/2023 0000   ALKPHOS 130 (A) 09/14/2023 0000   BILITOT 0.3 05/11/2023 1347   BILITOT 0.3 02/25/2016 0929      Component Value Date/Time   TSH 2.590 05/22/2019 0741   .medli  Assessment and Plan:   There are no diagnoses linked to this  encounter.  Assessment and Plan     Obesity Long-standing obesity since post-high school, with a peak weight of 318 lbs. Current weight is 306 lbs with a body fat percentage of 51%. Obesity is a chronic disease requiring lifelong management. A comprehensive approach including nutrition, physical activity, behavioral modification, and pharmacotherapy is essential. Discussed Wegovy  as an appetite suppressant, emphasizing the need for a solid nutritional foundation before initiation. Highlighted the importance of losing 10-15% of body weight to improve overall health and medical conditions. Potential side effects of Wegovy  include nausea and constipation if not paired with proper nutrition. - Implement a reduced calorie nutrition plan with a low glycemic load and low in saturated fats. - Increase physical activity as tolerated. - Screen for sleep apnea. - Educate on the importance of a solid nutritional foundation before starting Wegovy . - Monitor body composition to ensure healthy weight loss.  Prediabetes Prediabetes with benefits of a reduced calorie nutrition plan with a low glycemic load to improve glycemic control discussed. - Implement a  reduced calorie nutrition plan with a low glycemic load. - Check disease monitoring labs at intake  Hyperlipidemia Hyperlipidemia managed with Crestor  40 mg. Benefits of a reduced calorie nutrition plan low in saturated fats to improve lipid profile discussed. - Implement a reduced calorie nutrition plan low in saturated fats. - Check disease monitoring labs at intake  Hypertension Hypertension managed with losartan  and hydrochlorothiazide. - Check renal parameters and electrolytes at intake  Chronic Obstructive Pulmonary Disease (COPD) COPD contributing to exercise intolerance.  Losing weight may reduce pulmonary restriction.  Patient continues to work on smoking cessation.  Sarcoidosis Sarcoidosis without specific management changes.  Smoking may exacerbate condition  Hidradenitis Suppurativa Hidradenitis suppurativa managed with metformin for off-label use.  Smoking and obesity may exacerbate condition  General Health Maintenance Regular follow-up visits are important to monitor progress and adjust treatment plans. Emphasized lifestyle changes to support long-term weight management. - Schedule follow-up visits every 2-3 weeks initially, then monthly. - Complete health questionnaire and bring to next appointment. - Fast before next appointment for metabolic testing. - Undergo blood work at next appointment.             Obesity Treatment / Action Plan:  Patient will work on garnering support from family and friends to begin weight loss journey. Will work on eliminating or reducing the presence of highly palatable, calorie dense foods in the home. Will complete provided nutritional and psychosocial assessment questionnaire before the next appointment. Will be scheduled for indirect calorimetry to determine resting energy expenditure in a fasting state.  This will allow us  to create a reduced calorie, high-protein meal plan to promote loss of fat mass while preserving muscle  mass. Counseled on the health benefits of losing 5%-15% of total body weight. Was counseled on nutritional approaches to weight loss and benefits of reducing processed foods and consuming plant-based foods and high quality protein as part of nutritional weight management. Was counseled on pharmacotherapy and role as an adjunct in weight management.   Obesity Education Performed Today:  She was weighed on the bioimpedance scale and results were discussed and documented in the synopsis.  We discussed obesity as a disease and the importance of a more detailed evaluation of all the factors contributing to the disease.  We discussed the importance of long term lifestyle changes which include nutrition, exercise and behavioral modifications as well as the importance of customizing this to her specific health and social needs.  We discussed the  benefits of reaching a healthier weight to alleviate the symptoms of existing conditions and reduce the risks of the biomechanical, metabolic and psychological effects of obesity.  Kaitlyn [name] appears to be in the action stage of change and states they are ready to start intensive lifestyle modifications and behavioral modifications.    Reviewed by clinician on day of visit: allergies, medications, problem list, medical history, surgical history, family history, social history, and previous encounter notes pertinent to obesity diagnosis.   I have spent 42 minutes in the care of the patient today including: 4 minutes before the visit reviewing and prepping the chart 35 minutes face-to-face assessing and reviewing listed medical problems as outlined in obesity care plan, providing nutritional and behavioral counseling on topics outlined in the obesity care plan, counseling regarding anti-obesity medication as outlined in obesity care plan, independently interpreting test results and goals of care, as described in assessment and plan, and reviewing and  discussing biometric information and progress 7 minutes after the visit updating chart and documentation    Ladd Picker, MD

## 2024-03-31 ENCOUNTER — Other Ambulatory Visit: Payer: Self-pay | Admitting: Adult Health

## 2024-03-31 NOTE — Telephone Encounter (Signed)
 Copied from CRM [id number]. Topic: Clinical - Medication Refill >> Mar 31, 2024  1:46 PM Trevor Fudge R wrote: Medication: albuterol  (VENTOLIN  HFA) 108 (90 Base) MCG/ACT inhaler  Budeson-Glycopyrrol-Formoterol  (BREZTRI  AEROSPHERE) 160-9-4.8 MCG/ACT AERO    Has the patient contacted their pharmacy? No, prescriptions are expired.  (Agent: If no, request that the patient contact the pharmacy for the refill. If patient does not wish to contact the pharmacy document the reason why and proceed with request.) (Agent: If yes, when and what did the pharmacy advise?)  This is the patient's preferred pharmacy:  [id number] - [place], [place] - [address] AT [facility] OF [address] & [address] [place] [id number] Phone: [phone] Fax: [phone]  Is this the correct pharmacy for this prescription? Yes If no, delete pharmacy and type the correct one.   Has the prescription been filled recently? No  Is the patient out of the medication? No  Has the patient been seen for an appointment in the last year OR does the patient have an upcoming appointment? Yes  Can we respond through MyChart? No  Agent: Please be advised that Rx refills may take up to 3 business days. We ask that you follow-up with your pharmacy.

## 2024-04-01 MED ORDER — ALBUTEROL SULFATE HFA 108 (90 BASE) MCG/ACT IN AERS: 2.0000 | INHALATION_SPRAY | Freq: Four times a day (QID) | RESPIRATORY_TRACT | 2 refills | Status: DC | PRN

## 2024-04-01 MED ORDER — BREZTRI AEROSPHERE 160-9-4.8 MCG/ACT IN AERO: 2.0000 | INHALATION_SPRAY | Freq: Two times a day (BID) | RESPIRATORY_TRACT | 2 refills | Status: DC

## 2024-04-07 ENCOUNTER — Ambulatory Visit: Payer: Self-pay

## 2024-04-07 NOTE — Telephone Encounter (Signed)
 Please advise on pain management for patient. I will let her know that we do not prescribe controlled substances outside of state lines

## 2024-04-07 NOTE — Telephone Encounter (Signed)
 FYI Only or Action Required?: FYI only for provider  Patient was last seen in primary care on 02/27/2024 by Ladd Picker, MD. Called Nurse Triage reporting Hip Pain. Symptoms began several months ago. Interventions attempted: OTC medications: none and Prescription medications: prednisone  . Symptoms are: gradually worsening.  Triage Disposition: See PCP Within 2 Weeks  Patient/caregiver understands and will follow disposition?: Yes  [facility]  [address]  [place] [address]        Copied from CRM [id number]. Topic: Clinical - Red Word Triage >> Apr 07, 2024  9:50 AM Kaitlyn [name] wrote: Red Word that prompted transfer to Nurse Triage: Patient having sever pain when she moves and stand.  Patient wanting to see if she can have [name] small dose of Tramadol  called  into the pharmacy she is out of town in [place] . Patient talked with PCP about the pain at the last visit and she went to see an orthopedic doctor who stated she did not see anything on the x ray. Due to severe pain she scheduled another appt that is not until August. Reason for Disposition  Hip pain is [name] chronic symptom (recurrent or ongoing AND present > 4 weeks)  Answer Assessment - Initial Assessment Questions 1. LOCATION and RADIATION: "Where is the pain located?"      Right  2. QUALITY: "What does the pain feel like?"  (e.g., sharp, dull, aching, burning)     Sharp pain 3. SEVERITY: "How bad is the pain?" "What does it keep you from doing?"   (Scale 1-10; or mild, moderate, severe)   -  MILD (1-3): doesn't interfere with normal activities    -  MODERATE (4-7): interferes with normal activities (e.g., work or school) or awakens from sleep, limping    -  SEVERE (8-10): excruciating pain, unable to do any normal activities, unable to walk     Severe sharp that comes and goes 4. ONSET: "When did the pain start?" "Does it come and go, or is it there all the time?"     Over [name] month 5. WORK OR EXERCISE: "Has  there been any recent work or exercise that involved this part of the body?"      no 6. CAUSE: "What do you think is causing the hip pain?"      Walking or certain movements 7. AGGRAVATING FACTORS: "What makes the hip pain worse?" (e.g., walking, climbing stairs, running)     walking 8. OTHER SYMPTOMS: "Do you have any other symptoms?" (e.g., back pain, pain shooting down leg,  fever, rash)     no   Pt states that she is currently in [place]   Protocols used: Hip Pain-[name]-AH

## 2024-04-07 NOTE — Telephone Encounter (Signed)
 Spoke with patient and informed her of providers response.

## 2024-04-07 NOTE — Telephone Encounter (Signed)
 Yes she will need appt for any medication to be prescribed.  She can use tylenol  325 mg 2 tablets every 6 hours as needed, this is OTC but would recommend getting an appt for further recommendations.

## 2024-04-16 ENCOUNTER — Encounter (INDEPENDENT_AMBULATORY_CARE_PROVIDER_SITE_OTHER): Payer: Self-pay

## 2024-04-22 ENCOUNTER — Ambulatory Visit (INDEPENDENT_AMBULATORY_CARE_PROVIDER_SITE_OTHER): Admitting: Family Medicine

## 2024-04-22 ENCOUNTER — Encounter (INDEPENDENT_AMBULATORY_CARE_PROVIDER_SITE_OTHER): Admitting: Internal Medicine

## 2024-04-22 ENCOUNTER — Encounter (INDEPENDENT_AMBULATORY_CARE_PROVIDER_SITE_OTHER): Payer: Self-pay | Admitting: Family Medicine

## 2024-04-22 VITALS — BP 168/82 | HR 62 | Temp 97.9°F | Ht 66.0 in | Wt 312.0 lb

## 2024-04-22 DIAGNOSIS — I10 Essential (primary) hypertension: Secondary | ICD-10-CM

## 2024-04-22 DIAGNOSIS — R0602 Shortness of breath: Secondary | ICD-10-CM

## 2024-04-22 DIAGNOSIS — F3341 Major depressive disorder, recurrent, in partial remission: Secondary | ICD-10-CM

## 2024-04-22 DIAGNOSIS — E785 Hyperlipidemia, unspecified: Secondary | ICD-10-CM | POA: Diagnosis not present

## 2024-04-22 DIAGNOSIS — F17211 Nicotine dependence, cigarettes, in remission: Secondary | ICD-10-CM | POA: Diagnosis not present

## 2024-04-22 DIAGNOSIS — Z720 Tobacco use: Secondary | ICD-10-CM | POA: Insufficient documentation

## 2024-04-22 DIAGNOSIS — E782 Mixed hyperlipidemia: Secondary | ICD-10-CM

## 2024-04-22 DIAGNOSIS — R7303 Prediabetes: Secondary | ICD-10-CM

## 2024-04-22 DIAGNOSIS — R5383 Other fatigue: Secondary | ICD-10-CM | POA: Diagnosis not present

## 2024-04-22 DIAGNOSIS — R29818 Other symptoms and signs involving the nervous system: Secondary | ICD-10-CM

## 2024-04-22 DIAGNOSIS — Z6841 Body Mass Index (BMI) 40.0 and over, adult: Secondary | ICD-10-CM

## 2024-04-22 DIAGNOSIS — E559 Vitamin D deficiency, unspecified: Secondary | ICD-10-CM

## 2024-04-22 NOTE — Assessment & Plan Note (Signed)
 Patient voices that she is getting up frequently to pee and sometimes just because.  Needs further evaluation for sleep apnea.  Refer to [facility] for further eval.

## 2024-04-22 NOTE — Assessment & Plan Note (Signed)
 BP elevated today- she did not take her blood pressure medication today.  No chest pain, chest pressure or headache.  Sees Dr. Okey for cardiology. EKG unchanged from 2023 showing LBBB. On losartan  hydrochlorothiazide for BP management.  CMP today.

## 2024-04-22 NOTE — Assessment & Plan Note (Signed)
 Diagnosed around 12 years ago.  On statin daily.  No myalgias.  Well controlled with medication.  FLP today.

## 2024-04-22 NOTE — Assessment & Plan Note (Signed)
 Patient taking Chantix  and has been 1 month without a cigarette.  Encouraged patient to continue her cessation.  Will follow up at next appointment.

## 2024-04-22 NOTE — Assessment & Plan Note (Signed)
 Diagnosed in the last few months.  She voices that this is new.  On metformin daily (was on this previously due to HS).  Needs a repeat A1c and Insulin level today.

## 2024-04-22 NOTE — Assessment & Plan Note (Addendum)
 Patient interested in pursuing therapy for her food related moods.  Refer to Dr. Sharron

## 2024-04-22 NOTE — Progress Notes (Signed)
 Chief Complaint:  Obesity   Subjective:  Kaitlyn [name] (MR# [id number]) is a 58 y.o. female who presents for evaluation and treatment of obesity and related comorbidities.   Kaitlyn [name] is currently in the action stage of change and ready to dedicate time achieving and maintaining a healthier weight. Kaitlyn [name] is interested in becoming our patient and working on intensive lifestyle modifications including (but not limited to) diet and exercise for weight loss.  Kaitlyn [name] has been struggling with her weight. She has been unsuccessful in either losing weight, maintaining weight loss, or reaching her healthy weight goal.  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  Lives at home with her brother.  Brother is supportive of her and may be eating the same foods as she does. Desired weight is 150lbs- last time she was that weight was an unknown amount of time ago.  Started gaining weight in high school. Previously tried lean cuisin, weight watchers, phentermine , bariatric clinic- lost weight but didn't keep it off.   Brother cooks at her house and her boyfriend cooks at his house.  She tends to crave sweets and salt.  Skips breakfast daily but unclear as to why she is skipping.   Food recall: Cereal in the am occasionally like Honey Combs or Frosted Flakes with banana  with 2% milk (2 cups cereal, 1/2 cup milk). Feels satisfied.  Will have a snack during the day like fruit snacks and diet coke or coke zero and occasionally crystal light. Cheese sandwich or ham and cheese sandwich- one slice on bread. Eating because its time to eat.  Dinner at home or [employer] dash- Kaitlyn [name] or [employer] or [employer]- asian salad or chicken strips or teriyaki sub and cookie and a coke and at [employer] will get pizza and soft tacos. After dinner will have chips (out of big bag) or fruit snacks while watching tv.  Indirect Calorimeter completed today shows a kcal of 1911 .   Her calculated basal metabolic rate is 7637 thus her basal metabolic rate is worse than expected.  Other Fatigue Kaitlyn [name] admits to daytime somnolence and admits to waking up still tired. Patient has a history of symptoms of daytime fatigue, morning fatigue, and morning headache. Kaitlyn [name] generally gets 5 or 6 hours of sleep per night, and states that she has nightime awakenings. Snoring is present. Apneic episodes are not present. Epworth Sleepiness Score is 14.   Shortness of Breath Kaitlyn [name] notes increasing shortness of breath with exercising and seems to be worsening over time with weight gain. She notes getting out of breath sooner with activity than she used to. This has not gotten worse recently. Kaitlyn [name] denies shortness of breath at rest or orthopnea.  Depression Screen Kaitlyn [name]'s Food and Mood (modified PHQ-9) score was 19.     04/22/2024    9:55 AM  Depression screen PHQ 2/9  Decreased Interest 3  Down, Depressed, Hopeless 2  PHQ - 2 Score 5  Altered sleeping 3  Tired, decreased energy 2  Change in appetite 2  Feeling bad or failure about yourself  2  Trouble concentrating 2  Moving slowly or fidgety/restless 1  Suicidal thoughts 2  PHQ-9 Score 19  Difficult doing work/chores Extremely dIfficult     Objective:  No data recorded       04/22/2024    9:20 AM 04/22/2024    9:00 AM 02/27/2024    1:00 PM  Vitals  with BMI  Height  5' 6 5' 6  Weight  312 lbs 306 lbs  BMI  50.38 49.41  Systolic 168 167 871  Diastolic 82 55 84  Pulse  62 78     EKG: Normal sinus rhythm, rate 52 bpm.  General: Cooperative, alert, well developed, in no acute distress. HEENT: Conjunctivae and lids unremarkable. Cardiovascular: Regular rhythm.  Lungs: Normal work of breathing. Neurologic: No focal deficits.   Lab Results  Component Value Date   CREATININE 1.09 (H) 04/22/2024   BUN 16 04/22/2024   NA 140 04/22/2024   K 4.4 04/22/2024   CL 103 04/22/2024   CO2 22 04/22/2024   Lab Results   Component Value Date   ALT 10 04/22/2024   AST 14 04/22/2024   ALKPHOS 124 (H) 04/22/2024   BILITOT 0.4 04/22/2024   Lab Results  Component Value Date   HGBA1C 6.1 (H) 01/25/2024   HGBA1C 5.4 12/28/2016   HGBA1C 6.0 (H) 02/25/2016   HGBA1C 5.6 10/06/2013   HGBA1C 5.9 (H) 04/08/2013   Lab Results  Component Value Date   INSULIN  7.3 04/22/2024   Lab Results  Component Value Date   TSH 2.030 04/22/2024   Lab Results  Component Value Date   CHOL 194 04/22/2024   HDL 55 04/22/2024   LDLCALC 124 (H) 04/22/2024   TRIG 82 04/22/2024   CHOLHDL 3.5 04/22/2024   Lab Results  Component Value Date   WBC 8.2 04/22/2024   HGB 13.1 04/22/2024   HCT 41.4 04/22/2024   MCV 95 04/22/2024   PLT 209 04/22/2024   Lab Results  Component Value Date   IRON 46 01/31/2018    Assessment and Plan:   Other Fatigue  Kaitlyn [name] does feel that her weight is causing her energy to be lower than it should be. Fatigue may be related to obesity, depression or many other causes. Labs will be ordered, and in the meanwhile, Kaitlyn [name] will focus on self care including making healthy food choices, increasing physical activity and focusing on stress reduction.  Shortness of Breath  Kaitlyn [name] does feel that she gets out of breath more easily that she used to when she exercises. Kaitlyn [name]'s shortness of breath appears to be obesity related and exercise induced. She has agreed to work on weight loss and gradually increase exercise to treat her exercise induced shortness of breath. Will continue to monitor closely.   Problem List Items Addressed This Visit       Cardiovascular and Mediastinum   Benign essential HTN   BP elevated today- she did not take her blood pressure medication today.  No chest pain, chest pressure or headache.  Sees Dr. Okey for cardiology. EKG unchanged from 2023 showing LBBB. On losartan  hydrochlorothiazide for BP management.  CMP today.      Relevant Orders   Comprehensive metabolic panel  with GFR (Completed)     Other   Hyperlipidemia   Diagnosed around 12 years ago.  On statin daily.  No myalgias.  Well controlled with medication.  FLP today.      Relevant Orders   Lipid panel (Completed)   Recurrent major depressive disorder, in partial remission Kaitlyn [name])   Patient interested in pursuing therapy for her food related moods.  Refer to Dr. Sharron      Morbid obesity Kaitlyn [name])   Anthropometric Measurements Height: 5' 6 (1.676 m) Weight: (!) 312 lb (141.5 kg) BMI (Calculated): 50.38 Starting Weight: 312 lb Peak Weight: 312 Waist Measurement : 54.5 inches  Body Composition  Body Fat %: 59.4 % Fat Mass (lbs): 185.8 lbs Muscle Mass (lbs): 120.6 lbs Visceral Fat Rating : 23 Other Clinical Data Fasting: yes Labs: yes Today's Visit #: 1 Starting Date: 04/22/24 Comments: 1st Visit       Prediabetes   Diagnosed in the last few months.  She voices that this is new.  On metformin daily (was on this previously due to HS).  Needs a repeat A1c and Insulin  level today.      Relevant Orders   Insulin , random (Completed)   Tobacco abuse   Patient taking Chantix  and has been 1 month without a cigarette.  Encouraged patient to continue her cessation.  Will follow up at next appointment.      Suspected sleep apnea   Patient voices that she is getting up frequently to pee and sometimes just because.  Needs further evaluation for sleep apnea.  Refer to [facility] for further eval.      Relevant Orders   Ambulatory referral to Neurology   Vitamin D  deficiency   Previously low vitamin d - repeat level today to assess strength of supplementation needed      Relevant Orders   VITAMIN D  25 Hydroxy (Vit-D Deficiency, Fractures) (Completed)   Other Visit Diagnoses       Other fatigue    -  Primary   Relevant Orders   EKG 12-Lead (Completed)   Vitamin B12 (Completed)   Folate (Completed)   T3 (Completed)   T4, free (Completed)   TSH (Completed)     SOBOE (shortness of breath on  exertion)       Relevant Orders   CBC with Differential/Platelet (Completed)     BMI 50.0-59.9, adult (HCC)           Romie is currently in the action stage of change and her goal is to continue with weight loss efforts. I recommend Senna begin the structured treatment plan as follows:  She has agreed to Category 3 Plan  Exercise goals: All adults should avoid inactivity. Some activity is better than none, and adults who participate in any amount of physical activity, gain some health benefits.  Behavioral modification strategies:increasing lean protein intake, decreasing simple carbohydrates, increasing vegetables, no skipping meals, and meal planning and cooking strategies  She was informed of the importance of frequent follow-up visits to maximize her success with intensive lifestyle modifications for her multiple health conditions. She was informed we would discuss her lab results at her next visit unless there is a critical issue that needs to be addressed sooner. Enrika agreed to keep her next visit at the agreed upon time to discuss these results.  Labs ordered with plans to discuss at the next visit.   Attestation Statements:  Reviewed by clinician on day of visit: allergies, medications, problem list, medical history, surgical history, family history, social history, and previous encounter notes. This is the patient's first visit at [facility]. The patient's NEW PATIENT PACKET was reviewed at length. Included in the packet: current and past health history, medications, allergies, ROS, gynecologic history (women only), surgical history, family history, social history, weight history, weight loss surgery history (for those that have had weight loss surgery), nutritional evaluation, mood and food questionnaire, PHQ9, Epworth questionnaire, sleep habits questionnaire, patient life and health improvement goals questionnaire. These will all be scanned into the patient's  chart under media.   During the visit, I independently reviewed the patient's EKG, bioimpedance scale results, and indirect calorimeter  results. I used this information to tailor a meal plan for the patient that will help her to lose weight and will improve her obesity-related conditions going forward. I performed a medically necessary appropriate examination and/or evaluation. I discussed the assessment and treatment plan with the patient. The patient was provided an opportunity to ask questions and all were answered. The patient agreed with the plan and demonstrated an understanding of the instructions. Labs were ordered at this visit and will be reviewed at the next visit unless more critical results need to be addressed immediately. Clinical information was updated and documented in the EMR.    Adelita Cho, MD

## 2024-04-23 LAB — CBC WITH DIFFERENTIAL/PLATELET
Basophils Absolute: 0 10*3/uL (ref 0.0–0.2)
Basos: 0 %
EOS (ABSOLUTE): 0.2 10*3/uL (ref 0.0–0.4)
Eos: 2 %
Hematocrit: 41.4 % (ref 34.0–46.6)
Hemoglobin: 13.1 g/dL (ref 11.1–15.9)
Immature Grans (Abs): 0 10*3/uL (ref 0.0–0.1)
Immature Granulocytes: 0 %
Lymphocytes Absolute: 2.1 10*3/uL (ref 0.7–3.1)
Lymphs: 25 %
MCH: 30.1 pg (ref 26.6–33.0)
MCHC: 31.6 g/dL (ref 31.5–35.7)
MCV: 95 fL (ref 79–97)
Monocytes Absolute: 0.6 10*3/uL (ref 0.1–0.9)
Monocytes: 7 %
Neutrophils Absolute: 5.4 10*3/uL (ref 1.4–7.0)
Neutrophils: 66 %
Platelets: 209 10*3/uL (ref 150–450)
RBC: 4.35 x10E6/uL (ref 3.77–5.28)
RDW: 12.8 % (ref 11.7–15.4)
WBC: 8.2 10*3/uL (ref 3.4–10.8)

## 2024-04-23 LAB — COMPREHENSIVE METABOLIC PANEL WITH GFR
ALT: 10 IU/L (ref 0–32)
AST: 14 IU/L (ref 0–40)
Albumin: 3.6 g/dL — ABNORMAL LOW (ref 3.8–4.9)
Alkaline Phosphatase: 124 IU/L — ABNORMAL HIGH (ref 44–121)
BUN/Creatinine Ratio: 15 (ref 9–23)
BUN: 16 mg/dL (ref 6–24)
Bilirubin Total: 0.4 mg/dL (ref 0.0–1.2)
CO2: 22 mmol/L (ref 20–29)
Calcium: 9.3 mg/dL (ref 8.7–10.2)
Chloride: 103 mmol/L (ref 96–106)
Creatinine, Ser: 1.09 mg/dL — ABNORMAL HIGH (ref 0.57–1.00)
Globulin, Total: 3.8 g/dL (ref 1.5–4.5)
Glucose: 86 mg/dL (ref 70–99)
Potassium: 4.4 mmol/L (ref 3.5–5.2)
Sodium: 140 mmol/L (ref 134–144)
Total Protein: 7.4 g/dL (ref 6.0–8.5)
eGFR: 59 mL/min/{1.73_m2} — ABNORMAL LOW (ref >59)

## 2024-04-23 LAB — VITAMIN D 25 HYDROXY (VIT D DEFICIENCY, FRACTURES): Vit D, 25-Hydroxy: 19 ng/mL — ABNORMAL LOW (ref 30.0–100.0)

## 2024-04-23 LAB — LIPID PANEL
Chol/HDL Ratio: 3.5 ratio (ref 0.0–4.4)
Cholesterol, Total: 194 mg/dL (ref 100–199)
HDL: 55 mg/dL (ref >39)
LDL Chol Calc (NIH): 124 mg/dL — ABNORMAL HIGH (ref 0–99)
Triglycerides: 82 mg/dL (ref 0–149)
VLDL Cholesterol Cal: 15 mg/dL (ref 5–40)

## 2024-04-23 LAB — VITAMIN B12: Vitamin B-12: 1164 pg/mL (ref 232–1245)

## 2024-04-23 LAB — FOLATE: Folate: 7.3 ng/mL (ref >3.0)

## 2024-04-23 LAB — INSULIN, RANDOM: INSULIN: 7.3 u[IU]/mL (ref 2.6–24.9)

## 2024-04-23 LAB — T3: T3, Total: 104 ng/dL (ref 71–180)

## 2024-04-23 LAB — TSH: TSH: 2.03 u[IU]/mL (ref 0.450–4.500)

## 2024-04-23 LAB — T4, FREE: Free T4: 1.08 ng/dL (ref 0.82–1.77)

## 2024-05-03 DIAGNOSIS — E559 Vitamin D deficiency, unspecified: Secondary | ICD-10-CM | POA: Insufficient documentation

## 2024-05-03 NOTE — Assessment & Plan Note (Signed)
 Anthropometric Measurements Height: 5' 6 (1.676 m) Weight: (!) 312 lb (141.5 kg) BMI (Calculated): 50.38 Starting Weight: 312 lb Peak Weight: 312 Waist Measurement : 54.5 inches Body Composition  Body Fat %: 59.4 % Fat Mass (lbs): 185.8 lbs Muscle Mass (lbs): 120.6 lbs Visceral Fat Rating : 23 Other Clinical Data Fasting: yes Labs: yes Today's Visit #: 1 Starting Date: 04/22/24 Comments: 1st Visit

## 2024-05-03 NOTE — Assessment & Plan Note (Signed)
 Previously low vitamin d - repeat level today to assess strength of supplementation needed

## 2024-05-05 ENCOUNTER — Ambulatory Visit: Admitting: Nurse Practitioner

## 2024-05-06 ENCOUNTER — Ambulatory Visit (INDEPENDENT_AMBULATORY_CARE_PROVIDER_SITE_OTHER): Admitting: Family Medicine

## 2024-05-06 ENCOUNTER — Encounter (INDEPENDENT_AMBULATORY_CARE_PROVIDER_SITE_OTHER): Payer: Self-pay | Admitting: Family Medicine

## 2024-05-06 VITALS — BP 154/75 | HR 71 | Temp 98.4°F | Ht 66.0 in | Wt 297.0 lb

## 2024-05-06 DIAGNOSIS — E785 Hyperlipidemia, unspecified: Secondary | ICD-10-CM | POA: Diagnosis not present

## 2024-05-06 DIAGNOSIS — I10 Essential (primary) hypertension: Secondary | ICD-10-CM | POA: Diagnosis not present

## 2024-05-06 DIAGNOSIS — E559 Vitamin D deficiency, unspecified: Secondary | ICD-10-CM | POA: Diagnosis not present

## 2024-05-06 DIAGNOSIS — R7303 Prediabetes: Secondary | ICD-10-CM | POA: Diagnosis not present

## 2024-05-06 DIAGNOSIS — E782 Mixed hyperlipidemia: Secondary | ICD-10-CM

## 2024-05-06 DIAGNOSIS — Z6841 Body Mass Index (BMI) 40.0 and over, adult: Secondary | ICD-10-CM

## 2024-05-06 MED ORDER — ROSUVASTATIN CALCIUM 40 MG PO TABS
40.0000 mg | ORAL_TABLET | Freq: Every day | ORAL | 0 refills | Status: DC
Start: 2024-05-06 — End: 2024-09-05

## 2024-05-06 MED ORDER — CHOLECALCIFEROL 1.25 MG (50000 UT) PO TABS: 1.0000 | ORAL_TABLET | ORAL | 0 refills | Status: AC

## 2024-05-06 NOTE — Assessment & Plan Note (Signed)

## 2024-05-06 NOTE — Assessment & Plan Note (Signed)
 The 10-year ASCVD risk score (Arnett DK, et al., 2019) is: 20.2%   Values used to calculate the score:     Age: 58 years     Clincally relevant sex: Female     Is Non-Hispanic African American: Yes     Diabetic: No     Tobacco smoker: Yes     Systolic Blood Pressure: 154 mmHg     Is BP treated: Yes     HDL Cholesterol: 55 mg/dL     Total Cholesterol: 194 mg/dL

## 2024-05-06 NOTE — Assessment & Plan Note (Signed)
 Discussed importance of vitamin d supplementation.  Vitamin d supplementation has been shown to decrease fatigue, decrease risk of progression to insulin resistance and then prediabetes, decreases risk of falling in older age and can even assist in decreasing depressive symptoms in PTSD.   Prescription for Vitamin D sent in.

## 2024-05-06 NOTE — Progress Notes (Signed)
 SUBJECTIVE:  Chief Complaint: Obesity  Interim History: Patient feels like she did well even though she didn't follow the meal plan exactly.  She is surprised that the scale changed.  She incorporated grapefruit and mandarin oranges into the plan.  She did not get to weighing the meat for lunch and dinner.  Over the next few weeks she is going back to [place]  for 2 months.    Kaitlyn [name] is here to discuss her progress with her obesity treatment plan. She is on the Category 4 Plan and states she is following her eating plan approximately 40 % of the time. She states she is not exercising.  OBJECTIVE: Visit Diagnoses: Problem List Items Addressed This Visit       Cardiovascular and Mediastinum   Benign essential HTN   Relevant Medications   rosuvastatin  (CRESTOR ) 40 MG tablet     Other   Hyperlipidemia   The 10-year ASCVD risk score (Arnett DK, et al., 2019) is: 20.2%   Values used to calculate the score:     Age: 58 years     Clincally relevant sex: Female     Is Non-Hispanic African American: Yes     Diabetic: No     Tobacco smoker: Yes     Systolic Blood Pressure: 154 mmHg     Is BP treated: Yes     HDL Cholesterol: 55 mg/dL     Total Cholesterol: 194 mg/dL       Relevant Medications   rosuvastatin  (CRESTOR ) 40 MG tablet   Morbid obesity (HCC)   Prediabetes   Pathophysiology of progression through insulin  resistance to prediabetes and diabetes was discussed at length today.  Patient to continue to monitor and be in control of total intake of snack calories which may be simple carbohydrates but should be consumed only after the patient has taken in all the nutrition for the day.  Macronutrient identification, classification and daily intake ratios were discussed.  Plan to repeat labs in 3 months to monitor both hemoglobin A1c and insulin  levels.  No medications at this time as patient is not having significant hunger or cravings that would make following meal plan more  difficult.         Vitamin D  deficiency - Primary   Discussed importance of vitamin d  supplementation.  Vitamin d  supplementation has been shown to decrease fatigue, decrease risk of progression to insulin  resistance and then prediabetes, decreases risk of falling in older age and can even assist in decreasing depressive symptoms in PTSD.   Prescription for Vitamin D  sent in.        Relevant Medications   Cholecalciferol  1.25 MG (50000 UT) TABS   Other Visit Diagnoses       BMI 45.0-49.9, adult (HCC)           Vitals Temp: 98.4 F (36.9 C) BP: (!) 154/75 Pulse Rate: 71 SpO2: 98 %   Anthropometric Measurements Height: 5' 6 (1.676 m) Weight: 297 lb (134.7 kg) BMI (Calculated): 47.96 Weight at Last Visit: 312 lb Weight Lost Since Last Visit: 15 Weight Gained Since Last Visit: 0 Starting Weight: 312 lb Total Weight Loss (lbs): 15 lb (6.804 kg) Peak Weight: 312 lb   Body Composition  Body Fat %: 53.3 % Fat Mass (lbs): 158.4 lbs Muscle Mass (lbs): 131.6 lbs Total Body Water (lbs): 90.6 lbs Visceral Fat Rating : 20   Other Clinical Data Today's Visit #: 2 Starting Date: 04/22/24 Comments: Cat 4     ASSESSMENT  AND PLAN:  Diet: [name] is currently in the action stage of change. As such, her goal is to continue with weight loss efforts and has agreed to the Category 4 Plan.   Exercise:  All adults should avoid inactivity. Some activity is better than none, and adults who participate in any amount of physical activity, gain some health benefits.  Behavior Modification:  We discussed the following Behavioral Modification Strategies today: increasing lean protein intake, decreasing simple carbohydrates, increasing vegetables, avoiding temptations, and planning for success.  Return in about 8 weeks (around 07/01/2024).   She was informed of the importance of frequent follow up visits to maximize her success with intensive lifestyle modifications for her multiple  health conditions.  Attestation Statements:   Reviewed by clinician on day of visit: allergies, medications, problem list, medical history, surgical history, family history, social history, and previous encounter notes.     Adelita Cho, MD

## 2024-06-04 NOTE — Telephone Encounter (Signed)
**Note De-identified  [place] Obfuscation** Please advise 

## 2024-06-17 ENCOUNTER — Ambulatory Visit: Admitting: Internal Medicine

## 2024-06-24 ENCOUNTER — Encounter: Admitting: Adult Health

## 2024-07-21 ENCOUNTER — Ambulatory Visit (INDEPENDENT_AMBULATORY_CARE_PROVIDER_SITE_OTHER): Admitting: Family Medicine

## 2024-07-23 ENCOUNTER — Encounter: Admitting: Adult Health

## 2024-07-28 ENCOUNTER — Telehealth (INDEPENDENT_AMBULATORY_CARE_PROVIDER_SITE_OTHER): Admitting: Psychology

## 2024-07-29 ENCOUNTER — Ambulatory Visit (INDEPENDENT_AMBULATORY_CARE_PROVIDER_SITE_OTHER): Admitting: Family Medicine

## 2024-08-15 ENCOUNTER — Ambulatory Visit (INDEPENDENT_AMBULATORY_CARE_PROVIDER_SITE_OTHER): Admitting: Nurse Practitioner

## 2024-08-15 ENCOUNTER — Encounter: Payer: Self-pay | Admitting: Nurse Practitioner

## 2024-08-15 ENCOUNTER — Other Ambulatory Visit: Payer: Self-pay | Admitting: Nurse Practitioner

## 2024-08-15 VITALS — BP 162/96 | HR 50 | Temp 97.4°F | Ht 66.0 in | Wt 296.2 lb

## 2024-08-15 DIAGNOSIS — I10 Essential (primary) hypertension: Secondary | ICD-10-CM

## 2024-08-15 DIAGNOSIS — Z23 Encounter for immunization: Secondary | ICD-10-CM

## 2024-08-15 DIAGNOSIS — F321 Major depressive disorder, single episode, moderate: Secondary | ICD-10-CM | POA: Diagnosis not present

## 2024-08-15 DIAGNOSIS — D860 Sarcoidosis of lung: Secondary | ICD-10-CM

## 2024-08-15 DIAGNOSIS — R739 Hyperglycemia, unspecified: Secondary | ICD-10-CM

## 2024-08-15 DIAGNOSIS — L732 Hidradenitis suppurativa: Secondary | ICD-10-CM

## 2024-08-15 DIAGNOSIS — F17200 Nicotine dependence, unspecified, uncomplicated: Secondary | ICD-10-CM

## 2024-08-15 DIAGNOSIS — Z124 Encounter for screening for malignant neoplasm of cervix: Secondary | ICD-10-CM

## 2024-08-15 DIAGNOSIS — J4489 Other specified chronic obstructive pulmonary disease: Secondary | ICD-10-CM | POA: Diagnosis not present

## 2024-08-15 DIAGNOSIS — M160 Bilateral primary osteoarthritis of hip: Secondary | ICD-10-CM

## 2024-08-15 DIAGNOSIS — E782 Mixed hyperlipidemia: Secondary | ICD-10-CM

## 2024-08-15 MED ORDER — PREDNISONE 10 MG (21) PO TBPK: ORAL_TABLET | ORAL | 0 refills | Status: DC

## 2024-08-15 MED ORDER — AMLODIPINE BESYLATE 5 MG PO TABS: 5.0000 mg | ORAL_TABLET | Freq: Every day | ORAL | 1 refills | Status: DC

## 2024-08-15 MED ORDER — VARENICLINE TARTRATE (STARTER) 0.5 MG X 11 & 1 MG X 42 PO TBPK: ORAL_TABLET | ORAL | 0 refills | Status: DC

## 2024-08-15 MED ORDER — MONTELUKAST SODIUM 10 MG PO TABS: 10.0000 mg | ORAL_TABLET | Freq: Every day | ORAL | 3 refills | Status: DC

## 2024-08-15 NOTE — Patient Instructions (Addendum)
 Referral given for GYN Can get mammogram at GYN or call [facility] to schedule.  Refill for Singulair  sent to restart for COPD  Start mucinex DM by mouth twice daily for 1 week with full glass of water Prednisone  taper for COPD   ADD norvasc 5 mg by mouth daily for blood pressure To check blood pressure 1 hour AFTER you have had your medication Make sure you have been sitting at least 5 mins  Record and let us  know.  Goal <140/90

## 2024-08-15 NOTE — Progress Notes (Signed)
 "   Careteam: Patient Care Team: Caro Harlene POUR, NP as PCP - General (Geriatric Medicine) Okey Vina GAILS, MD as PCP - Cardiology (Cardiology) Robinson Pao, MD as Consulting Physician (Dermatology)  PLACE OF SERVICE:  [facility]  Advanced Directive information    Allergies  Allergen Reactions   Codeine Other (See Comments)    Reaction unknown   Cyclinex [Tetracycline Hcl] Other (See Comments)    Reaction unknown, all cyclines   Milk Thistle Hives   Morphine  And Codeine Other (See Comments)    Reaction unknown   Tetracyclines & Related     Chief Complaint  Patient presents with   Medicare Wellness    3 month routine follow up  Pt hasn't had Mammorgram  Willing to get flu shot today  Will got to local pharmacy for Shingles PT postpone : COVID PNEUM HEP B vaccine      HPI:  Discussed the use of AI scribe software for clinical note transcription with the patient, who gave verbal consent to proceed.  History of Present Illness Kaitlyn [name] is a 58 year old female with bilateral hip pain and COPD who presents for a routine follow-up.  She experiences significant bilateral hip pain, with the left hip being more painful than the right. The pain is constant in the left hip and occurs with movement in the right hip. She is following a category four diet and is part of a healthy weight and wellness program, although she missed a session in August due to travel. She uses tizanidine 2 mg for muscle spasms, taking one tablet, and reports some relief. She tried CBD for pain management while in [place] , which helped, but she stopped due to cost and availability issues. She is currently taking diclofenac  75 mg twice daily for pain, but it is not effective, leading her to use CBD. She has also tried meloxicam, which she reports helped her pain more than diclofenac .  She has a history of COPD and reports worsening symptoms, including difficulty breathing, especially when active. She  uses albuterol  as needed and has been using it more frequently, requiring four puffs in the morning. She ran out of Singulair  while in [place]  and needs a refill. She also uses Breztri  and has an upcoming appointment for COPD management. Exposure to cat dander at her boyfriend's house exacerbates her symptoms.  She has a history of hypertension and is currently on losartan  hydrochlorothiazide. She does not consistently check her blood pressure at home and is unsure if she took her medication the previous day. She notes her blood pressure is high, possibly due to pain. She has an upcoming appointment with her cardiologist.  She has a history of smoking but quit for three months. She recently had a cigarette after returning home, influenced by her brother's smoking. She is not currently on Chantix  but is considering restarting it to help with cravings. She has been using gum to manage cravings, consuming a significant amount.  She is on rosuvastatin  40 mg daily for hyperlipidemia and has been on it for a couple of years. She has not seen her heart specialist recently but has an appointment scheduled. Her cholesterol was high in June.  She has a history of hidradenitis suppurativa and is currently experiencing a flare. She uses Keflex and clindamycin ointment for management. She has been prescribed metformin to help with flares and suspects tomatoes may be a trigger.  She takes Zoloft  25 mg and Wellbutrin  for mood, but received the wrong  brand of Wellbutrin  from a pharmacy in [place] , which she feels is less effective. She has been feeling 'weepy' recently.  She has a history of acid reflux and takes Pepcid  once daily, although it is prescribed twice daily. She experienced reflux after consuming tomatoes recently.    Review of Systems:  Review of Systems  Constitutional:  Negative for chills, fever and weight loss.  HENT:  Negative for tinnitus.   Respiratory:  Negative for cough, sputum  production and shortness of breath.   Cardiovascular:  Negative for chest pain, palpitations and leg swelling.  Gastrointestinal:  Negative for abdominal pain, constipation, diarrhea and heartburn.  Genitourinary:  Negative for dysuria, frequency and urgency.  Musculoskeletal:  Positive for joint pain. Negative for back pain, falls and myalgias.  Skin: Negative.   Neurological:  Negative for dizziness and headaches.  Psychiatric/Behavioral:  Negative for depression and memory loss. The patient does not have insomnia.     Past Medical History:  Diagnosis Date   Anxiety    Asthma    inhalers    Back pain    Bronchitis    Carpal tunnel syndrome 01/29/2015   right   Chewing difficulty    Constipation    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   PFT 03/13/12>>FEV1 1.65 (57%), FEV1% 58, TLC 4.44 (79%), DLCO 62%, no BD    Depression    Depression    Edema of both lower extremities    Emphysema lung (HCC)    Extrinsic asthma, unspecified    GERD (gastroesophageal reflux disease)    Hidradenitis    Hyperlipidemia    takes pravachol    Hypertension    Joint pain    Keloid scar    Major depressive disorder    Obesity    Osteoarthritis    Osteoarthrosis, unspecified whether generalized or localized, unspecified site    Palpitations    PONV (postoperative nausea and vomiting)    Prediabetes    Pulmonary sarcoidosis 02/22/2012   PFT 03/13/12>>FEV1 1.65 (57%), FEV1% 58, TLC 4.44 (79%), DLCO 62%, no BD EBUS with Tbx 03/01/12>>Streptococcus pneumoniae in BAL, cytology/Tbx negative Likely sarcoidosis>>start prednisone  04/30/12    Sarcoidosis    Shortness of breath    exertion   SOBOE (shortness of breath on exertion)    Swallowing difficulty    Wears dentures    top   Past Surgical History:  Procedure Laterality Date   MULTIPLE TOOTH EXTRACTIONS  2010   NASAL TURBINATE REDUCTION Bilateral 06/29/2009   inferior   OPEN REDUCTION NASAL FRACTURE  06/29/2009   with exc. bx. left  intranasal papilloma   OTHER SURGICAL HISTORY  2010   cyst removal from right middle finger     REMOVAL OF GROWTH FROM FINGER     DR MEYERDIERKS   VIDEO BRONCHOSCOPY WITH ENDOBRONCHIAL ULTRASOUND  03/01/2012   Social History:   reports that she has been smoking e-cigarettes and cigarettes. She has been exposed to tobacco smoke. She has never used smokeless tobacco. She reports current alcohol use. She reports that she does not use drugs.  Family History  Problem Relation Age of Onset   Hyperlipidemia Mother    Stroke Mother    Arthritis Mother    Depression Mother    Schizophrenia Mother    Heart disease Father    Hypertension Father    Cancer Father    Cancer Maternal Aunt        mother's aunt   Diabetes Maternal Aunt     Medications:  Patient's Medications  New Prescriptions   No medications on file  Previous Medications   ACETAMINOPHEN  (TYLENOL ) 500 MG TABLET    Take 500 mg by mouth every 6 (six) hours as needed. For pain   ALBUTEROL  (VENTOLIN  HFA) 108 (90 BASE) MCG/ACT INHALER    Inhale 2 puffs into the lungs every 6 (six) hours as needed for wheezing or shortness of breath.   ASPIRIN  EC 81 MG TABLET    Take 81 mg by mouth daily. Swallow whole.   ATORVASTATIN  (LIPITOR) 40 MG TABLET    Take 40 mg by mouth daily.   BUDESONIDE -GLYCOPYRROLATE -FORMOTEROL  (BREZTRI  AEROSPHERE) 160-9-4.8 MCG/ACT AERO INHALER    Inhale 2 puffs into the lungs in the morning and at bedtime.   BUPROPION  (WELLBUTRIN  SR) 150 MG 12 HR TABLET    Take 1 tablet (150 mg total) by mouth 2 (two) times daily.   CALCIUM  CARBONATE (OS-CAL - DOSED IN MG OF ELEMENTAL CALCIUM ) 1250 (500 CA) MG TABLET    Take 1 tablet by mouth.   CEPHALEXIN (KEFLEX) 500 MG CAPSULE    Take 1,000 mg by mouth daily.   CHOLECALCIFEROL  1.25 MG (50000 UT) TABS    Take 1 tablet by mouth once a week.   CLINDAMYCIN (CLEOCIN T) 1 % LOTION    Apply 1 application  topically daily.   DICLOFENAC  (VOLTAREN ) 75 MG EC TABLET    Take 75 mg by mouth 2  (two) times daily.   DICLOFENAC  SODIUM (VOLTAREN ) 1 % GEL    Apply 4 g topically as needed.   FAMOTIDINE  (PEPCID ) 20 MG TABLET    Take 1 tablet (20 mg total) by mouth 2 (two) times daily before a meal.   FLUTICASONE  (FLONASE ) 50 MCG/ACT NASAL SPRAY    Place 1 spray into both nostrils daily.   FUROSEMIDE  (LASIX ) 40 MG TABLET    Take one tablet 1-2 times per week as needed for swelling.   GLUCOSAMINE HCL PO    Take 1 tablet by mouth 2 (two) times daily.   HYDROCORTISONE 2.5 % CREAM    Apply 1 application topically 2 (two) times daily as needed for rash.   IPRATROPIUM-ALBUTEROL  (DUONEB) 0.5-2.5 (3) MG/3ML SOLN    Take 3 mLs by nebulization every 4 (four) hours as needed (shortness of breath  (D86.0, J44.9)).   LOSARTAN -HYDROCHLOROTHIAZIDE (HYZAAR) 100-12.5 MG TABLET    TAKE 1 TABLET BY MOUTH DAILY   METFORMIN (GLUCOPHAGE-XR) 750 MG 24 HR TABLET    Take 750 mg by mouth daily.   MONTELUKAST  (SINGULAIR ) 10 MG TABLET    Take 1 tablet (10 mg total) by mouth at bedtime.   OMEGA-3 FISH OIL (MAXEPA) 1000 MG CAPS CAPSULE    Take by mouth.   POTASSIUM CHLORIDE  SA (KLOR-CON ) 20 MEQ TABLET    Take one tablet 1-2 times per week as needed.  Take with Lasix .   ROSUVASTATIN  (CRESTOR ) 40 MG TABLET    Take 1 tablet (40 mg total) by mouth daily.   SERTRALINE  (ZOLOFT ) 25 MG TABLET    TAKE 1 TABLET(25 MG) BY MOUTH DAILY   TIZANIDINE (ZANAFLEX) 2 MG TABLET    Take 2 mg by mouth 3 (three) times daily as needed.   TRAMADOL  (ULTRAM ) 50 MG TABLET    Take 1 tablet (50 mg total) by mouth every 12 (twelve) hours as needed for moderate pain.   TURMERIC PO    Take 538 mg by mouth 2 (two) times a day.    VARENICLINE  TARTRATE, STARTER, (CHANTIX  STARTING  MONTH PAK) 0.5 MG X 11 & 1 MG X 42 TBPK    Take as directed   VITAMIN B-12 (CYANOCOBALAMIN) 100 MCG TABLET    Take 200 mcg by mouth daily. 1000 x2   VITAMIN D  PO    Take by mouth.  Modified Medications   No medications on file  Discontinued Medications   No medications on file     Physical Exam:  Vitals:   08/15/24 0928 08/15/24 0929  BP: (!) 172/98 (!) 162/96  Pulse: (!) 50   Temp: (!) 97.4 F (36.3 C)   SpO2: 94%   Weight: 296 lb 3.2 oz (134.4 kg)   Height: 5' 6 (1.676 m)    Body mass index is 47.81 kg/m. Wt Readings from Last 3 Encounters:  08/15/24 296 lb 3.2 oz (134.4 kg)  05/06/24 297 lb (134.7 kg)  04/22/24 (!) 312 lb (141.5 kg)    Physical Exam Constitutional:      General: She is not in acute distress.    Appearance: She is well-developed. She is not diaphoretic.  HENT:     Head: Normocephalic and atraumatic.     Mouth/Throat:     Pharynx: No oropharyngeal exudate.  Eyes:     Conjunctiva/sclera: Conjunctivae normal.     Pupils: Pupils are equal, round, and reactive to light.  Cardiovascular:     Rate and Rhythm: Normal rate and regular rhythm.     Heart sounds: Normal heart sounds.  Pulmonary:     Effort: Pulmonary effort is normal.     Breath sounds: Normal breath sounds.  Abdominal:     General: Bowel sounds are normal.     Palpations: Abdomen is soft.  Musculoskeletal:     Cervical back: Normal range of motion and neck supple.     Right lower leg: No edema.     Left lower leg: No edema.  Skin:    General: Skin is warm and dry.  Neurological:     Mental Status: She is alert and oriented to person, place, and time.     Gait: Gait abnormal.  Psychiatric:        Mood and Affect: Mood normal.     Labs reviewed: Basic Metabolic Panel: Recent Labs    09/14/23 0000 10/15/23 0917 04/22/24 1054  NA 137 139 140  K 4.1 4.6 4.4  CL 101 101 103  CO2 29* 29 22  GLUCOSE  --  99 86  BUN 13 11 16   CREATININE 1.1 0.97 1.09*  CALCIUM  9.3 9.6 9.3  TSH  --   --  2.030   Liver Function Tests: Recent Labs    09/14/23 0000 04/22/24 1054  AST 15 14  ALT 10 10  ALKPHOS 130* 124*  BILITOT  --  0.4  PROT  --  7.4  ALBUMIN 3.7 3.6*   No results for input(s): LIPASE, AMYLASE in the last 8760 hours. No results for  input(s): AMMONIA in the last 8760 hours. CBC: Recent Labs    09/14/23 0000 04/22/24 1054  WBC 10.9 8.2  NEUTROABS 7,957.00 5.4  HGB 14.4 13.1  HCT 44 41.4  MCV  --  95  PLT 256 209   Lipid Panel: Recent Labs    04/22/24 1054  CHOL 194  HDL 55  LDLCALC 124*  TRIG 82  CHOLHDL 3.5   TSH: Recent Labs    04/22/24 1054  TSH 2.030   A1C: Lab Results  Component Value Date   HGBA1C 6.1 (H) 01/25/2024  Assessment/Plan  Benign essential HTN Assessment & Plan: Not controlled.  Will ADD norvasc  5 mg daily to current regimen Also dietary modifications encouraged as well to get to goal She has follow up with cardiology schedule to follow up on blood pressure To check bp at home as well and record   Orders: -     Comprehensive metabolic panel with GFR -     amLODIPine  Besylate; Take 1 tablet (5 mg total) by mouth daily.  Dispense: 30 tablet; Refill: 1  COPD with chronic bronchitis (HCC) Assessment & Plan: Acute COPD flare with increased dyspnea and wheezing. Using albuterol  and Breztri . Singulair  not taken. Cat dander exposure noted. - Refill Singulair  and take as directed. - Start Mucinex DM twice daily for one week. - Start prednisone  taper. -continue albuterol  PRN and routine breztri    Orders: -     Montelukast  Sodium; Take 1 tablet (10 mg total) by mouth at bedtime.  Dispense: 90 tablet; Refill: 3 -     predniSONE ; Use as directed  Dispense: 21 tablet; Refill: 0  Morbid obesity (HCC) Assessment & Plan: -education provided on healthy weight loss through increase in physical activity and proper nutrition. Continue to follow up with medical weight and wellness   Depression, major, single episode, moderate (HCC) Assessment & Plan: Continues on zoloft  and wellbutrin  Overall stable.    Mixed hyperlipidemia Assessment & Plan: Continues on rosuvastatin  40 mg daily. Cardiologist follow-up scheduled. Cholesterol not at goal in June, continues to work on  dietary modifications Reports complaints with medication.  - Continue rosuvastatin  40 mg daily. - Follow up with cardiologist at [facility] end.  Orders: -     Comprehensive metabolic panel with GFR  Hidradenitis suppurativa Assessment & Plan: Current flare possibly triggered by diet. On Keflex, clindamycin ointment, and metformin. - Continue Keflex and clindamycin ointment. - Continue metformin. - Avoid dietary triggers like tomatoes.   Pulmonary sarcoidosis Assessment & Plan: Ongoing, followed by pulmonary, has follow up scheduled.   Orders: -     Montelukast  Sodium; Take 1 tablet (10 mg total) by mouth at bedtime.  Dispense: 90 tablet; Refill: 3  Hyperglycemia Assessment & Plan: Continue dietary modifications, will follow a1c  Orders: -     Hemoglobin A1c  Cervical cancer screening -     Ambulatory referral to Gynecology  Flu vaccine need -     Flu vaccine trivalent PF, 6mos and older(Flulaval,Afluria,Fluarix,Fluzone)  Smoker Assessment & Plan: Recent relapse after three-month cessation. Not smoking but experiencing cravings. Chantix  previously successful. - Restart Chantix  with titration pack. - discussed sugar-free vs sugar gum for cravings.  Orders: -     Varenicline  Tartrate (Starter); Take as directed  Dispense: 53 each; Refill: 0  Primary osteoarthritis of both hips Assessment & Plan: Severe bilateral hip pain, left more than right. Managed by orthopedics. Current diclofenac  ineffective. Tizanidine prescribed for spasms. CBD provided some relief. Weight loss recommended for surgical outcomes. - Discontinue diclofenac . - Contact orthopedic surgeon to discuss meloxicam. - Consider aquatic therapy for weight loss and pain management.   Discussed importance of scheduling mammogram Return in about 6 months (around 02/13/2025) for routine follow up.:due to having follow ups schedule with cardiology, pulmonary, orthopedics and weight management, sooner if needed  for acute issues Gionna Polak K. Caro BODILY [facility] [phone]  "

## 2024-08-16 LAB — COMPREHENSIVE METABOLIC PANEL WITH GFR
AG Ratio: 1 (calc) (ref 1.0–2.5)
ALT: 11 U/L (ref 6–29)
AST: 14 U/L (ref 10–35)
Albumin: 3.7 g/dL (ref 3.6–5.1)
Alkaline phosphatase (APISO): 99 U/L (ref 37–153)
BUN: 10 mg/dL (ref 7–25)
CO2: 29 mmol/L (ref 20–32)
Calcium: 9.8 mg/dL (ref 8.6–10.4)
Chloride: 104 mmol/L (ref 98–110)
Creat: 0.92 mg/dL (ref 0.50–1.03)
Globulin: 3.6 g/dL (ref 1.9–3.7)
Glucose, Bld: 90 mg/dL (ref 65–139)
Potassium: 4.2 mmol/L (ref 3.5–5.3)
Sodium: 141 mmol/L (ref 135–146)
Total Bilirubin: 0.5 mg/dL (ref 0.2–1.2)
Total Protein: 7.3 g/dL (ref 6.1–8.1)
eGFR: 72 mL/min/1.73m2 (ref >=60)

## 2024-08-16 LAB — HEMOGLOBIN A1C
Hgb A1c MFr Bld: 5.7 % — ABNORMAL HIGH (ref <5.7)
Mean Plasma Glucose: 117 mg/dL
eAG (mmol/L): 6.5 mmol/L

## 2024-08-17 ENCOUNTER — Ambulatory Visit: Payer: Self-pay | Admitting: Nurse Practitioner

## 2024-08-17 DIAGNOSIS — F17200 Nicotine dependence, unspecified, uncomplicated: Secondary | ICD-10-CM | POA: Insufficient documentation

## 2024-08-17 DIAGNOSIS — F321 Major depressive disorder, single episode, moderate: Secondary | ICD-10-CM | POA: Insufficient documentation

## 2024-08-17 DIAGNOSIS — L732 Hidradenitis suppurativa: Secondary | ICD-10-CM | POA: Insufficient documentation

## 2024-08-17 DIAGNOSIS — R739 Hyperglycemia, unspecified: Secondary | ICD-10-CM | POA: Insufficient documentation

## 2024-08-17 NOTE — Assessment & Plan Note (Signed)
 Recent relapse after three-month cessation. Not smoking but experiencing cravings. Chantix  previously successful. - Restart Chantix  with titration pack. - discussed sugar-free vs sugar gum for cravings.

## 2024-08-17 NOTE — Assessment & Plan Note (Signed)
 Not controlled.  Will ADD norvasc 5 mg daily to current regimen Also dietary modifications encouraged as well to get to goal She has follow up with cardiology schedule to follow up on blood pressure To check bp at home as well and record

## 2024-08-17 NOTE — Assessment & Plan Note (Signed)
 Continues on zoloft  and wellbutrin  Overall stable.

## 2024-08-17 NOTE — Assessment & Plan Note (Signed)
 Current flare possibly triggered by diet. On Keflex, clindamycin ointment, and metformin. - Continue Keflex and clindamycin ointment. - Continue metformin. - Avoid dietary triggers like tomatoes.

## 2024-08-17 NOTE — Assessment & Plan Note (Signed)
-  education provided on healthy weight loss through increase in physical activity and proper nutrition. Continue to follow up with medical weight and wellness

## 2024-08-17 NOTE — Assessment & Plan Note (Signed)
 Ongoing, followed by pulmonary, has follow up scheduled.

## 2024-08-17 NOTE — Assessment & Plan Note (Signed)
 Acute COPD flare with increased dyspnea and wheezing. Using albuterol  and Breztri . Singulair  not taken. Cat dander exposure noted. - Refill Singulair  and take as directed. - Start Mucinex DM twice daily for one week. - Start prednisone  taper. -continue albuterol  PRN and routine breztri 

## 2024-08-17 NOTE — Assessment & Plan Note (Signed)
 Continue dietary modifications, will follow a1c

## 2024-08-17 NOTE — Assessment & Plan Note (Signed)
 Continues on rosuvastatin  40 mg daily. Cardiologist follow-up scheduled. Cholesterol not at goal in June, continues to work on dietary modifications Reports complaints with medication.  - Continue rosuvastatin  40 mg daily. - Follow up with cardiologist at [facility] end.

## 2024-08-18 DIAGNOSIS — M160 Bilateral primary osteoarthritis of hip: Secondary | ICD-10-CM | POA: Insufficient documentation

## 2024-08-18 NOTE — Assessment & Plan Note (Signed)
 Severe bilateral hip pain, left more than right. Managed by orthopedics. Current diclofenac  ineffective. Tizanidine prescribed for spasms. CBD provided some relief. Weight loss recommended for surgical outcomes. - Discontinue diclofenac . - Contact orthopedic surgeon to discuss meloxicam. - Consider aquatic therapy for weight loss and pain management.

## 2024-08-22 ENCOUNTER — Other Ambulatory Visit (INDEPENDENT_AMBULATORY_CARE_PROVIDER_SITE_OTHER): Payer: Self-pay | Admitting: Family Medicine

## 2024-08-22 ENCOUNTER — Ambulatory Visit: Admitting: Nurse Practitioner

## 2024-08-22 DIAGNOSIS — E782 Mixed hyperlipidemia: Secondary | ICD-10-CM

## 2024-08-25 ENCOUNTER — Ambulatory Visit (INDEPENDENT_AMBULATORY_CARE_PROVIDER_SITE_OTHER): Admitting: Family Medicine

## 2024-08-25 ENCOUNTER — Telehealth: Payer: Self-pay

## 2024-08-25 ENCOUNTER — Encounter (INDEPENDENT_AMBULATORY_CARE_PROVIDER_SITE_OTHER): Payer: Self-pay | Admitting: Family Medicine

## 2024-08-25 VITALS — BP 161/81 | HR 80 | Temp 98.2°F | Ht 66.0 in | Wt 283.0 lb

## 2024-08-25 DIAGNOSIS — Z6841 Body Mass Index (BMI) 40.0 and over, adult: Secondary | ICD-10-CM

## 2024-08-25 DIAGNOSIS — I10 Essential (primary) hypertension: Secondary | ICD-10-CM | POA: Diagnosis not present

## 2024-08-25 DIAGNOSIS — R7303 Prediabetes: Secondary | ICD-10-CM

## 2024-08-25 NOTE — Telephone Encounter (Signed)
 Pt has ov 11/3

## 2024-08-25 NOTE — Telephone Encounter (Signed)
*  Pulm  Pharmacy Patient Advocate Encounter   Received notification from CoverMyMeds that prior authorization for Breztri  Aerosphere 160-9-4.8MCG/ACT aerosol  is required/requested.   Insurance verification completed.   The patient is insured through [facility].   Per test claim: PA required; However, NEW/RECENT labs/notes are needed to complete & submit PA request. Please see below.

## 2024-08-25 NOTE — Assessment & Plan Note (Addendum)
 Recent A1c improved from 6.1 to 5.7.  Patient has been working on controlling her simple carbohydrates which she reports is easier now that she is at home.  Next labs will be in February of 2026.

## 2024-08-25 NOTE — Progress Notes (Signed)
 SUBJECTIVE:  Chief Complaint: Obesity  Interim History: Patient here for first appointment since July. Kaitlyn [name] was following meal plan fairly strictly while away at Kaitlyn [name] boyfriend's house but was snacking more frequently on some indulgent snacks.  Pain was a contributing factor while away which is improved now.  Kaitlyn [name] will be staying here for 7 months without going back to Kaitlyn [name] boyfriends house.  Kaitlyn [name] is working toward BMI of 40 for surgery.  All food is not at home for meal plan- will likely get groceries around November 3rd.   Kaitlyn [name] is here to discuss Kaitlyn [name] progress with Kaitlyn [name] obesity treatment plan. Kaitlyn [name] is on the Category 4 Plan and states Kaitlyn [name] is following Kaitlyn [name] eating plan approximately 80 % of the time. Kaitlyn [name] states Kaitlyn [name] is not exercising.   OBJECTIVE: Visit Diagnoses: Problem List Items Addressed This Visit       Cardiovascular and Mediastinum   Hypertension - Primary   Recently started norvasc around 1 week ago.  Kaitlyn [name] is taking voltaren  twice a day.  Kaitlyn [name] is now holding taking the voltaren  to get BP better controlled.  Will follow up on treatment plan at next appointment after patient sees Cardiology.  No changes in medication at this time.        Other   Morbid obesity (HCC)   Prediabetes   Recent A1c improved from 6.1 to 5.7.        Other Visit Diagnoses       BMI 45.0-49.9, adult (HCC)           Vitals Temp: 98.2 F (36.8 C) BP: (!) 161/81 Pulse Rate: 80 SpO2: 97 %   Anthropometric Measurements Height: 5' 6 (1.676 m) Weight: 283 lb (128.4 kg) BMI (Calculated): 45.7 Weight at Last Visit: 297 lb Weight Lost Since Last Visit: 14 lb Starting Weight: 312 lb Total Weight Loss (lbs): 29 lb (13.2 kg)   Body Composition  Body Fat %: 52.4 % Fat Mass (lbs): 148.2 lbs Muscle Mass (lbs): 128 lbs Total Body Water (lbs): 88 lbs Visceral Fat Rating : 19   Other Clinical Data Today's Visit #: 3 Starting Date: 04/22/24 Comments: Cat 4     ASSESSMENT AND PLAN: Assessment  & Plan Primary hypertension Recent appointments reviewed and it is noted that started norvasc around 1 week ago.  Kaitlyn [name] is taking voltaren  twice a day.  Kaitlyn [name] is now holding taking the voltaren  to get BP better controlled.  Will follow up on treatment plan at next appointment after patient sees Cardiology.  No changes in medication at this time- med list updated. Prediabetes Recent A1c improved from 6.1 to 5.7.  Patient has been working on controlling Kaitlyn [name] simple carbohydrates which Kaitlyn [name] reports is easier now that Kaitlyn [name] is at home.  Next labs will be in February of 2026. Morbid obesity (HCC)  BMI 45.0-49.9, adult (HCC)    Diet: Kaitlyn [name] is currently in the action stage of change. As such, Kaitlyn [name] goal is to continue with weight loss efforts and has agreed to the Category 3 Plan when patient is able to go grocery shopping.  Exercise:  All adults should avoid inactivity. Some activity is better than none, and adults who participate in any amount of physical activity, gain some health benefits.  Behavior Modification:  We discussed the following Behavioral Modification Strategies today: increasing lean protein intake, decreasing simple carbohydrates, meal planning and cooking strategies, and planning for success.   Return in about 3 weeks (around 09/15/2024).   Kaitlyn [name] was informed of the  importance of frequent follow up visits to maximize Kaitlyn [name] success with intensive lifestyle modifications for Kaitlyn [name] multiple health conditions.  Attestation Statements:   Reviewed by clinician on day of visit: allergies, medications, problem list, medical history, surgical history, family history, social history, and previous encounter notes.   Adelita Cho, MD

## 2024-08-25 NOTE — Assessment & Plan Note (Addendum)
 Recent appointments reviewed and it is noted that started norvasc around 1 week ago.  She is taking voltaren  twice a day.  She is now holding taking the voltaren  to get BP better controlled.  Will follow up on treatment plan at next appointment after patient sees Cardiology.  No changes in medication at this time- med list updated.

## 2024-08-25 NOTE — Telephone Encounter (Signed)
 CMM Key: [id number]

## 2024-08-31 NOTE — Progress Notes (Unsigned)
 Cardiology Office Note   Date:  09/05/2024   ID:  Kaitlyn [name], DOB 10-07-1966, MRN [id number]  PCP:  Caro Harlene POUR, NP  Cardiologist:   Vina Gull, MD   Patient presents for follow up of HTN      History of Present Illness: Kaitlyn [name] is a 58 y.o. female with a history of HTN, asthma, tobacco abuse, COPD, Sarcoid   She follows in pulmonary clinic  I saw the pt in Nov 2023     The pt notes rare CP with breathing    Not at other times  BP has been high  She was started on amloidpine    Denies dizziness   Breathing is stable Not too active  Bad hip arthritis    Working to lose weight so she can have hip surgery  QUit smoking but now is back to smoking   Current Meds  Medication Sig   acetaminophen  (TYLENOL ) 500 MG tablet Take 500 mg by mouth every 6 (six) hours as needed. For pain   albuterol  (VENTOLIN  HFA) 108 (90 Base) MCG/ACT inhaler Inhale 2 puffs into the lungs every 6 (six) hours as needed for wheezing or shortness of breath.   amLODipine (NORVASC) 5 MG tablet Take 1 tablet (5 mg total) by mouth daily.   aspirin  EC 81 MG tablet Take 81 mg by mouth daily. Swallow whole.   budesonide -glycopyrrolate -formoterol  (BREZTRI  AEROSPHERE) 160-9-4.8 MCG/ACT AERO inhaler Inhale 2 puffs into the lungs in the morning and at bedtime.   buPROPion  (WELLBUTRIN  SR) 150 MG 12 hr tablet Take 1 tablet (150 mg total) by mouth 2 (two) times daily.   calcium  carbonate (OS-CAL - DOSED IN MG OF ELEMENTAL CALCIUM ) 1250 (500 Ca) MG tablet Take 1 tablet by mouth.   cephALEXin (KEFLEX) 500 MG capsule Take 1,000 mg by mouth daily.   Cholecalciferol  1.25 MG (50000 UT) TABS Take 1 tablet by mouth once a week.   clindamycin (CLEOCIN T) 1 % lotion Apply 1 application  topically daily.   diclofenac  (VOLTAREN ) 75 MG EC tablet Take 75 mg by mouth 2 (two) times daily.   diclofenac  Sodium (VOLTAREN ) 1 % GEL Apply 4 g topically as needed.   famotidine  (PEPCID ) 20 MG tablet Take 1 tablet (20 mg total)  by mouth 2 (two) times daily before a meal.   fluticasone  (FLONASE ) 50 MCG/ACT nasal spray Place 1 spray into both nostrils daily.   furosemide  (LASIX ) 40 MG tablet Take one tablet 1-2 times per week as needed for swelling.   GLUCOSAMINE HCL PO Take 1 tablet by mouth 2 (two) times daily.   hydrocortisone 2.5 % cream Apply 1 application topically 2 (two) times daily as needed for rash.   ipratropium-albuterol  (DUONEB) 0.5-2.5 (3) MG/3ML SOLN Take 3 mLs by nebulization every 4 (four) hours as needed (shortness of breath  (D86.0, J44.9)).   losartan -hydrochlorothiazide (HYZAAR) 100-12.5 MG tablet TAKE 1 TABLET BY MOUTH DAILY   metFORMIN (GLUCOPHAGE-XR) 750 MG 24 hr tablet Take 750 mg by mouth daily.   montelukast  (SINGULAIR ) 10 MG tablet Take 1 tablet (10 mg total) by mouth at bedtime.   omega-3 fish oil (MAXEPA) 1000 MG CAPS capsule Take by mouth.   potassium chloride  SA (KLOR-CON ) 20 MEQ tablet Take one tablet 1-2 times per week as needed.  Take with Lasix .   sertraline  (ZOLOFT ) 25 MG tablet TAKE 1 TABLET(25 MG) BY MOUTH DAILY   tiZANidine (ZANAFLEX) 2 MG tablet Take 2 mg by mouth 3 (three) times  daily as needed.   TURMERIC PO Take 538 mg by mouth 2 (two) times a day.    Varenicline  Tartrate, Starter, (CHANTIX  STARTING MONTH PAK) 0.5 MG X 11 & 1 MG X 42 TBPK Take as directed   vitamin B-12 (CYANOCOBALAMIN) 100 MCG tablet Take 200 mcg by mouth daily. 1000 x2   VITAMIN D  PO Take by mouth.   [DISCONTINUED] rosuvastatin  (CRESTOR ) 40 MG tablet Take 1 tablet (40 mg total) by mouth daily.     Allergies:   Codeine, Cyclinex [tetracycline hcl], Milk thistle, Morphine  and codeine, and Tetracyclines & related   Past Medical History:  Diagnosis Date   Anxiety    Asthma    inhalers    Back pain    Bronchitis    Carpal tunnel syndrome 01/29/2015   right   Chewing difficulty    Constipation    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   PFT 03/13/12>>FEV1 1.65 (57%), FEV1% 58, TLC 4.44 (79%),  DLCO 62%, no BD    Depression    Depression    Edema of both lower extremities    Emphysema lung (HCC)    Extrinsic asthma, unspecified    GERD (gastroesophageal reflux disease)    Hidradenitis    Hyperlipidemia    takes pravachol    Hypertension    Joint pain    Keloid scar    Major depressive disorder    Obesity    Osteoarthritis    Osteoarthrosis, unspecified whether generalized or localized, unspecified site    Palpitations    PONV (postoperative nausea and vomiting)    Prediabetes    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 bx. left intranasal papilloma   OTHER SURGICAL HISTORY  2010   cyst removal from right middle finger     REMOVAL OF GROWTH FROM FINGER     DR MEYERDIERKS   VIDEO BRONCHOSCOPY WITH ENDOBRONCHIAL ULTRASOUND  03/01/2012     Social History:  The patient  reports that she has been smoking e-cigarettes and cigarettes. She has been exposed to tobacco smoke. She has never used smokeless tobacco. She reports current alcohol use. She reports that she does not use drugs.   Family History:  The patient's family history includes Arthritis in her mother; Cancer in her father and maternal aunt; Depression in her mother; Diabetes in her maternal aunt; Heart disease in her father; Hyperlipidemia in her mother; Hypertension in her father; Schizophrenia in her mother; Stroke in her mother.    ROS:  Please see the history of present  illness. All other systems are reviewed and  Negative to the above problem except as noted.    PHYSICAL EXAM: VS:  BP 126/80 (BP Location: Left Arm, Patient Position: Sitting, Cuff Size: Large)   Pulse (!) 55   Ht 5' 7 (1.702 m)   Wt 291 lb 4.8 oz (132.1 kg)   SpO2 97%   BMI 45.62 kg/m  \  GEN: Obese 58 yo , in no acute distress  HEENT: normal  Neck: no JVD, carotid bruit Cardiac: RRR; no murmur Respiratory  Mild rhonchi   GI: soft, nontender,No hepatomegaly  Ext  No LE edema   EKG:  EKG is not done    Lipid Panel    Component Value Date/Time   CHOL 194 04/22/2024 1054   TRIG 82 04/22/2024 1054   HDL 55 04/22/2024 1054   CHOLHDL 3.5 04/22/2024 1054   CHOLHDL 2.5 04/09/2023 1213   VLDL 19 12/28/2016 1013   LDLCALC 124 (H) 04/22/2024 1054   LDLCALC 60 04/09/2023 1213      Wt Readings from Last 3 Encounters:  09/05/24 291 lb 4.8 oz (132.1 kg)  09/01/24 288 lb 3.2 oz (130.7 kg)  08/25/24 283 lb (128.4 kg)      ASSESSMENT AND PLAN:  1   HTN   BP is controlled   2  Hx dizziness   Pt denies   3   Sarcoid   Followed in pulmonary  Cardiac MRI in [id number]  No evidence of cardiac involvement    4  Hx LBBB  Monitor in 2023  No bradycardia/heart block   Occasional PAC   No recent EKG  5  Hx dizziness Pt denies  6  Lipids    Last LDL 124 in June  Will repeat    Atherosclerosis on CT  7   Tobacco use  Pt has resumed smoking  Counselled on cessatoin         Current medicines are reviewed at length with the patient today.  The patient does not have concerns regarding medicines.  Signed, Vina Gull, MD

## 2024-09-01 ENCOUNTER — Telehealth: Payer: Self-pay

## 2024-09-01 ENCOUNTER — Encounter: Payer: Self-pay | Admitting: Adult Health

## 2024-09-01 ENCOUNTER — Ambulatory Visit: Admitting: Adult Health

## 2024-09-01 VITALS — BP 136/85 | HR 67 | Temp 97.8°F | Ht 67.0 in | Wt 288.2 lb

## 2024-09-01 DIAGNOSIS — F17210 Nicotine dependence, cigarettes, uncomplicated: Secondary | ICD-10-CM

## 2024-09-01 DIAGNOSIS — D869 Sarcoidosis, unspecified: Secondary | ICD-10-CM

## 2024-09-01 DIAGNOSIS — J339 Nasal polyp, unspecified: Secondary | ICD-10-CM | POA: Diagnosis not present

## 2024-09-01 DIAGNOSIS — E669 Obesity, unspecified: Secondary | ICD-10-CM

## 2024-09-01 DIAGNOSIS — Z87891 Personal history of nicotine dependence: Secondary | ICD-10-CM

## 2024-09-01 DIAGNOSIS — J310 Chronic rhinitis: Secondary | ICD-10-CM

## 2024-09-01 DIAGNOSIS — D860 Sarcoidosis of lung: Secondary | ICD-10-CM

## 2024-09-01 DIAGNOSIS — J4489 Other specified chronic obstructive pulmonary disease: Secondary | ICD-10-CM

## 2024-09-01 DIAGNOSIS — J449 Chronic obstructive pulmonary disease, unspecified: Secondary | ICD-10-CM

## 2024-09-01 DIAGNOSIS — Z122 Encounter for screening for malignant neoplasm of respiratory organs: Secondary | ICD-10-CM

## 2024-09-01 DIAGNOSIS — Z6841 Body Mass Index (BMI) 40.0 and over, adult: Secondary | ICD-10-CM

## 2024-09-01 MED ORDER — BREZTRI AEROSPHERE 160-9-4.8 MCG/ACT IN AERO: 2.0000 | INHALATION_SPRAY | Freq: Two times a day (BID) | RESPIRATORY_TRACT | 5 refills | Status: AC

## 2024-09-01 MED ORDER — FLUTICASONE PROPIONATE 50 MCG/ACT NA SUSP: 1.0000 | Freq: Every day | NASAL | 5 refills | Status: AC

## 2024-09-01 MED ORDER — ALBUTEROL SULFATE HFA 108 (90 BASE) MCG/ACT IN AERS: 2.0000 | INHALATION_SPRAY | Freq: Four times a day (QID) | RESPIRATORY_TRACT | 3 refills | Status: AC | PRN

## 2024-09-01 MED ORDER — MONTELUKAST SODIUM 10 MG PO TABS: 10.0000 mg | ORAL_TABLET | Freq: Every day | ORAL | 3 refills | Status: AC

## 2024-09-01 MED ORDER — IPRATROPIUM-ALBUTEROL 0.5-2.5 (3) MG/3ML IN SOLN: 3.0000 mL | RESPIRATORY_TRACT | 5 refills | Status: AC | PRN

## 2024-09-01 MED ORDER — FAMOTIDINE 20 MG PO TABS: 20.0000 mg | ORAL_TABLET | Freq: Two times a day (BID) | ORAL | 5 refills | Status: AC

## 2024-09-01 NOTE — Progress Notes (Signed)
 @Patient  ID: Kaitlyn [name], female    DOB: 1966/07/06, 58 y.o.   MRN: [id number]  Chief Complaint  Patient presents with   Medical Management of Chronic Issues    COPD, SOB, Bronchitis     Referring provider: Caro Harlene POUR, NP  HPI: 58 year old female smoker followed for Severe COPD and pulmonary sarcoidosis Medical history significant for hidradenitis   TEST/EVENTS : Reviewed 09/01/2024  EBUS with Tbx 03/01/12 >> Streptococcus pneumoniae in BAL, cytology/Tbx negative PFT 03/13/12>>FEV1 1.65 (57%), FEV1% 58, TLC 4.44 (79%), DLCO 62%, no BD PFT 03/18/13 >> FEV1 1.78 (69%), FEV1% 67, TLC 4.04 (75%), DLCO 92%, no BD PFT 01/13/14 >> FEV1 1.64 (70%), FEV1% 70, TLC 3.69 (68%), DLCO 79% PFT 08/22/22 >> FEV1 1.35 (47%), FEV1% 64, TLC 5.87 (109%), RV 3.06 (150%), DLCO 98%   CT chest 01/23/12 >> bulky mediastinal/hilar LAN.  Scattered ill-defined upper lobe predominate perilymphatic and centrilobular nodules. CT chest 04/30/12 >> No change in the appearance of the mediastinal and hilar adenopathy and pulmonary nodularity CT chest 07/31/12 >> b/l hilar adenopathy no change, b/l upper lobe subpleural nodularity, improved LUL GGO CT chest 04/14/13 >> peribronchovascular nodularity b/l upper lobes Rt > Lt mildly progressed, no change to mediastinal/hilar adenopathy CT chest 01/05/14 >> upper lobe predominant prebronchovascular reticulo-nodule opacities, 1.2 cm Rt paratracheal LAN, 1.6 cm subcarinal LAN, 1.7 cm Rt hilar LAN  CT chest 10/07/14 >> decreased LAN CT chest 04/26/16 >> atherosclerosis, borderline LAN, patchy nodularity b/l  HRCT chest 12/16/19 >> atherosclerosis, mild patchy air trapping b/l, diffuse irregular thickening of interstitium with mild/mod central BTX and architectural distortion w/o change HRCT chest 08/15/22 >> widespread thickening of the peribronchovascular interstitium with some patchy peribronchovascular ground-glass attenuation, most evident throughout the mid to upper  lungs bilaterally. Scattered areas of very subtle fissural micro nodularity are noted.  Mild air trapping.  MR cardiac February 2024 -normal LV size and EF.  No evidence of cardiac sarcoid  Discussed the use of AI scribe software for clinical note transcription with the patient, who gave verbal consent to proceed.  History of Present Illness Kaitlyn [name] is a 58 year old female with severe COPD and sarcoidosis who presents for follow-up care.  She experienced worsening COPD symptoms, including significant coughing and wheezing, while visiting her boyfriend, likely due to exposure to cat dander and dust. Her symptoms improved upon returning home. She was prescribed a 21-day course of prednisone , which she finished all but 3 days.  And Mucinex DM, which she did not take as directed, yet she noted improvement in her symptoms with these treatments.  She is currently using Breztri , two puffs in the morning and evening, but has not been taking it regularly. She also uses a nebulizer and has a rescue inhaler, albuterol , which she needs refilled. She takes Singulair  for allergies, which she believes helps with her breathing, and has been prescribed Pepcid  twice a day for heartburn, which has been worsening at night. She also uses Flonase  nasal spray daily.  She has a significant smoking history, having smoked for over 40 years, up to a pack a day. She is attempting to quit smoking and is using Zyban  to aid in cessation.  She reports having a nasal polyp and requests a referral to an ENT. She has not had nasal polyp surgery in the past but did have a turbinate surgery.  She mentions needing hip replacement surgery and is currently in a wheelchair due to pain when walking. She is on  a weight loss program and needs to lose 50 pounds before she can undergo hip replacement surgery. She is on a weight loss program.  She has an extensive smoking history started smoking around age 14 up to a pack a day.  We  discussed the lung cancer CT screening program.   Allergies  Allergen Reactions   Codeine Other (See Comments)    Reaction unknown   Cyclinex [Tetracycline Hcl] Other (See Comments)    Reaction unknown, all cyclines   Milk Thistle Hives   Morphine  And Codeine Other (See Comments)    Reaction unknown   Tetracyclines & Related     Immunization History  Administered Date(s) Administered   Fluad Quad(high Dose 65+) 08/22/2022   Influenza Whole 10/31/2011, 08/15/2013   Influenza, Seasonal, Injecte, Preservative Fre 10/15/2023, 08/15/2024   Influenza,inj,Quad PF,6+ Mos 09/21/2014, 07/12/2015, 08/29/2016, 09/24/2017, 08/05/2018, 09/08/2019   PFIZER(Purple Top)SARS-COV-2 Vaccination 12/14/2019, 01/11/2020   Pneumococcal Conjugate-13 09/24/2017   Pneumococcal Polysaccharide-23 02/21/2010, 07/30/2012   Td 12/28/2016    Past Medical History:  Diagnosis Date   Anxiety    Asthma    inhalers    Back pain    Bronchitis    Carpal tunnel syndrome 01/29/2015   right   Chewing difficulty    Constipation    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   PFT 03/13/12>>FEV1 1.65 (57%), FEV1% 58, TLC 4.44 (79%), DLCO 62%, no BD    Depression    Depression    Edema of both lower extremities    Emphysema lung (HCC)    Extrinsic asthma, unspecified    GERD (gastroesophageal reflux disease)    Hidradenitis    Hyperlipidemia    takes pravachol    Hypertension    Joint pain    Keloid scar    Major depressive disorder    Obesity    Osteoarthritis    Osteoarthrosis, unspecified whether generalized or localized, unspecified site    Palpitations    PONV (postoperative nausea and vomiting)    Prediabetes    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 Pt is trying to quit.  Vaping every day with nicotine. Amy marsh, cma       Pt stated she no longer vapes as of 08/15/2024. Patient stated she smoke cigarettes every once in a while.  AG CMA    Ready to quit: Not Answered Counseling given: Not Answered Tobacco comments: 1-2 every couple of weeks. Pt is trying to quit.  Vaping every day with nicotine. Amy marsh, cma   Pt stated she no longer vapes as of 08/15/2024. Patient stated she smoke cigarettes every once in a while.  AG CMA    Outpatient Medications Prior to Visit  Medication Sig Dispense Refill   acetaminophen  (TYLENOL ) 500 MG tablet Take 500 mg by mouth every 6 (six) hours as needed. For pain     amLODipine (NORVASC) 5 MG tablet Take 1 tablet (  5 mg total) by mouth daily. 30 tablet 1   aspirin  EC 81 MG tablet Take 81 mg by mouth daily. Swallow whole.     buPROPion  (WELLBUTRIN  SR) 150 MG 12 hr tablet Take 1 tablet (150 mg total) by mouth 2 (two) times daily. 60 tablet 5   calcium  carbonate (OS-CAL - DOSED IN MG OF ELEMENTAL CALCIUM ) 1250 (500 Ca) MG tablet Take 1 tablet by mouth.     cephALEXin (KEFLEX) 500 MG capsule Take 1,000 mg by mouth daily.     Cholecalciferol  1.25 MG (50000 UT) TABS Take 1 tablet by mouth once a week. 12 tablet 0   clindamycin (CLEOCIN T) 1 % lotion Apply 1 application  topically daily.     diclofenac  (VOLTAREN ) 75 MG EC tablet Take 75 mg by mouth 2 (two) times daily.     diclofenac  Sodium (VOLTAREN ) 1 % GEL Apply 4 g topically as needed.     furosemide  (LASIX ) 40 MG tablet Take one tablet 1-2 times per week as needed for swelling. 30 tablet 1   GLUCOSAMINE HCL PO Take 1 tablet by mouth 2 (two)  times daily.     hydrocortisone 2.5 % cream Apply 1 application topically 2 (two) times daily as needed for rash.     losartan -hydrochlorothiazide (HYZAAR) 100-12.5 MG tablet TAKE 1 TABLET BY MOUTH DAILY 90 tablet 1   metFORMIN (GLUCOPHAGE-XR) 750 MG 24 hr tablet Take 750 mg by mouth daily.     omega-3 fish oil (MAXEPA) 1000 MG CAPS capsule Take by mouth.     potassium chloride  SA (KLOR-CON ) 20 MEQ tablet Take one tablet 1-2 times per week as needed.  Take with Lasix . 30 tablet 1   predniSONE  (STERAPRED UNI-PAK 21 TAB) 10 MG (21) TBPK tablet Use as directed 21 tablet 0   rosuvastatin  (CRESTOR ) 40 MG tablet Take 1 tablet (40 mg total) by mouth daily. 90 tablet 0   sertraline  (ZOLOFT ) 25 MG tablet TAKE 1 TABLET(25 MG) BY MOUTH DAILY 90 tablet 1   tiZANidine (ZANAFLEX) 2 MG tablet Take 2 mg by mouth 3 (three) times daily as needed.     TURMERIC PO Take 538 mg by mouth 2 (two) times a day.      Varenicline  Tartrate, Starter, (CHANTIX  STARTING MONTH PAK) 0.5 MG X 11 & 1 MG X 42 TBPK Take as directed 53 each 0   vitamin B-12 (CYANOCOBALAMIN) 100 MCG tablet Take 200 mcg by mouth daily. 1000 x2     VITAMIN D  PO Take by mouth.     albuterol  (VENTOLIN  HFA) 108 (90 Base) MCG/ACT inhaler Inhale 2 puffs into the lungs every 6 (six) hours as needed for wheezing or shortness of breath. 8 g 2   budesonide -glycopyrrolate -formoterol  (BREZTRI  AEROSPHERE) 160-9-4.8 MCG/ACT AERO inhaler Inhale 2 puffs into the lungs in the morning and at bedtime. 10.7 g 2   famotidine  (PEPCID ) 20 MG tablet Take 1 tablet (20 mg total) by mouth 2 (two) times daily before a meal. 60 tablet 0   fluticasone  (FLONASE ) 50 MCG/ACT nasal spray Place 1 spray into both nostrils daily. 16 g 5   ipratropium-albuterol  (DUONEB) 0.5-2.5 (3) MG/3ML SOLN Take 3 mLs by nebulization every 4 (four) hours as needed (shortness of breath  (D86.0, J44.9)). 360 mL 5   montelukast  (SINGULAIR ) 10 MG tablet Take 1 tablet (10 mg total) by mouth at bedtime. 90  tablet 3   No facility-administered medications prior to visit.     Review of Systems:   Constitutional:  No  weight loss, night sweats,  Fevers, chills, +fatigue, or  lassitude.  HEENT:   No headaches,  Difficulty swallowing,  Tooth/dental problems, or  Sore throat,                No sneezing, itching, ear ache, nasal congestion, post nasal drip,   CV:  No chest pain,  Orthopnea, PND, swelling in lower extremities, anasarca, dizziness, palpitations, syncope.   GI  No heartburn, indigestion, abdominal pain, nausea, vomiting, diarrhea, change in bowel habits, loss of appetite, bloody stools.   Resp:   No chest wall deformity  Skin: no rash or lesions.  GU: no dysuria, change in color of urine, no urgency or frequency.  No flank pain, no hematuria   MS:  No joint pain or swelling.  No decreased range of motion.  No back pain.    Physical Exam  BP 136/85   Pulse 67   Temp 97.8 F (36.6 C) (Oral)   Ht 5' 7 (1.702 m) Comment: per patient  Wt 288 lb 3.2 oz (130.7 kg)   SpO2 97%   BMI 45.14 kg/m   GEN: A/Ox3; pleasant , NAD, well nourished    HEENT:  [place]/AT,  NOSE-clear drainage, THROAT-clear, no lesions, no postnasal drip or exudate noted.   NECK:  Supple w/ fair ROM; no JVD; normal carotid impulses w/o bruits; no thyromegaly or nodules palpated; no lymphadenopathy.    RESP  Clear  P & A; w/o, wheezes/ rales/ or rhonchi. no accessory muscle use, no dullness to percussion  CARD:  RRR, no m/r/g, no peripheral edema, pulses intact, no cyanosis or clubbing.  GI:   Soft & nt; nml bowel sounds; no organomegaly or masses detected.   Musco: Warm bil, no deformities or joint swelling noted.   Neuro: alert, no focal deficits noted.    Skin: Warm, no lesions or rashes    Lab Results:Reviewed 09/01/2024   CBC    Component Value Date/Time   WBC 8.2 04/22/2024 1054   WBC 10.3 05/01/2023 1429   RBC 4.35 04/22/2024 1054   RBC 4.79 09/14/2023 0000   HGB 13.1 04/22/2024  1054   HCT 41.4 04/22/2024 1054   PLT 209 04/22/2024 1054   MCV 95 04/22/2024 1054   MCH 30.1 04/22/2024 1054   MCH 30.4 05/01/2023 1429   MCHC 31.6 04/22/2024 1054   MCHC 32.8 05/01/2023 1429   RDW 12.8 04/22/2024 1054   LYMPHSABS 2.1 04/22/2024 1054   MONOABS 0.5 11/30/2020 1229   EOSABS 0.2 04/22/2024 1054   BASOSABS 0.0 04/22/2024 1054    BMET    Component Value Date/Time   NA 141 08/15/2024 1008   NA 140 04/22/2024 1054   K 4.2 08/15/2024 1008   CL 104 08/15/2024 1008   CO2 29 08/15/2024 1008   GLUCOSE 90 08/15/2024 1008   BUN 10 08/15/2024 1008   BUN 16 04/22/2024 1054   CREATININE 0.92 08/15/2024 1008   CALCIUM  9.8 08/15/2024 1008   GFRNONAA 64 12/28/2016 1013   GFRAA 74 12/28/2016 1013    BNP    Component Value Date/Time   BNP 16 06/03/2018 1027    ProBNP    Component Value Date/Time   PROBNP 72 06/07/2021 0746    Imaging: No results found.  Administration History     None          Latest Ref Rng & Units 08/22/2022    9:01 AM 01/13/2014   10:06 AM  PFT Results  FVC-Pre  L 2.06  2.43   FVC-Predicted Pre % 56  77   FVC-Post L 2.11  2.34   FVC-Predicted Post % 58  74   Pre FEV1/FVC % % 65  65   Post FEV1/FCV % % 64  70   FEV1-Pre L 1.33  1.59   FEV1-Predicted Pre % 47  62   FEV1-Post L 1.35  1.64   DLCO uncorrected ml/min/mmHg 21.47  21.42   DLCO UNC% % 98  79   DLCO corrected ml/min/mmHg 21.47    DLCO COR %Predicted % 98    DLVA Predicted % 124  102   TLC L 5.87    TLC % Predicted % 109    RV % Predicted % 150      No results found for: NITRICOXIDE      No data to display              Assessment & Plan:   Assessment and Plan Assessment & Plan Chronic obstructive pulmonary disease (COPD)   COPD exacerbation likely triggered by allergens such as cat dander and dust. Symptoms improved after leaving the allergen-rich environment. Non-compliance with Breztri  may have worsened symptoms. Send Breztri  prescription to  pharmacy for recertification. Instruct to take Breztri  two puffs in the morning and two puffs in the evening. Send albuterol  inhaler prescription to pharmacy. Ensure availability of nebulizer and Duoneb for use as needed. Encourage compliance with Singulair  for allergy management. Advise to avoid prednisone  unless absolutely necessary due to potential adverse effects. Order pulmonary function test for next visit.  Sarcoidosis   Sarcoidosis appears well-managed with no worsening of nodules as per last evaluation. Refer to lung cancer screening program for annual CT scan, which will also monitor sarcoidosis.  Chronic rhinitis and nasal polyp   Nasal polyp causing nasal obstruction and discomfort. Previous nasal surgery  Refer to ENT for evaluation and management of nasal polyp. Instruct to use Flonase  one spray in each nostril daily.  Nicotine dependence, current   Long history of smoking with recent relapse after quitting. Currently attempting to quit again using Zyban . Smoking cessation is crucial for managing COPD and reducing lung cancer risk. Refer to lung cancer screening program for annual CT scan due to smoking history. Encourage continued use of Zyban  for smoking cessation.  Obesity   Obesity is a barrier to hip replacement surgery, requiring weight loss. Encourage continuation of weight loss program with Lovastone for healthy weight loss.  Plan  Patient Instructions  Refer to Lung Cancer CT chest Screening program.  Continue on Breztri  2 puffs Twice daily   Albuterol  or Duoneb As needed   Continue on Singulair  daily  Continue on Flonase  daily  Activity as tolerated.  Work on healthy weight loss.  Refer to ENT Work on quitting smoking.  Follow up in 3 months and As needed             Esmae Donathan, NP 09/01/2024  I spent   minutes dedicated to the care of this patient on the date of this encounter to include pre-visit review of records, face-to-face time with the patient  discussing conditions above, post visit ordering of testing, clinical documentation with the electronic health record, making appropriate referrals as documented, and communicating necessary findings to members of the patients care team.

## 2024-09-01 NOTE — Patient Instructions (Addendum)
 Refer to Lung Cancer CT chest Screening program.  Continue on Breztri  2 puffs Twice daily   Albuterol  or Duoneb As needed   Continue on Singulair  daily  Continue on Flonase  daily  Activity as tolerated.  Work on healthy weight loss.  Refer to ENT Work on quitting smoking.  Follow up in 3 months and As needed

## 2024-09-01 NOTE — Telephone Encounter (Signed)
 Lung Cancer Screening Narrative/Criteria Questionnaire (Cigarette Smokers Only- No Cigars/Pipes/vapes)   Kaitlyn [name]   SDMV:09/08/2024 at 9:45 am [name]         1966/06/13               LDCT: 09/09/2024 at 10:40 am GI    58 y.o.   Phone: [phone]  Lung Screening Narrative (confirm age 71-77 yrs Medicare / 50-80 yrs Private pay insurance)   Insurance information: Medicaid   Referring Provider: Caro, NP   This screening involves an initial phone call with a team member from our program. It is called a shared decision making visit. The initial meeting is required by  insurance and Medicare to make sure you understand the program. This appointment takes about 15-20 minutes to complete. You will complete the screening scan at your scheduled date/time.  This scan takes about 5-10 minutes to complete. You can eat and drink normally before and after the scan.  Criteria questions for Lung Cancer Screening:   Are you a current or former smoker? Current Age began smoking: 11   If you are a former smoker, what year did you quit smoking? Quit 4 years and started back. (within 15 yrs)   To calculate your smoking history, I need an accurate estimate of how many packs of cigarettes you smoked per day and for how many years. (Not just the number of PPD you are now smoking)   Years smoking 37 x Packs per day 1.5 = Pack years 55.5   (at least 20 pack yrs)   (Make sure they understand that we need to know how much they have smoked in the past, not just the number of PPD they are smoking now)  Do you have a personal history of cancer? No    Do you have a family history of cancer? Yes  (cancer type and and relative) Father had pancreatic cancer.  Are you coughing up blood?  No  Have you had unexplained weight loss of 15 lbs or more in the last 6 months? No  It looks like you meet all criteria.  When would be a good time for us  to schedule you for this screening?   Additional information:  N/A

## 2024-09-02 ENCOUNTER — Other Ambulatory Visit (HOSPITAL_COMMUNITY): Payer: Self-pay

## 2024-09-02 ENCOUNTER — Encounter (INDEPENDENT_AMBULATORY_CARE_PROVIDER_SITE_OTHER): Payer: Self-pay

## 2024-09-02 ENCOUNTER — Telehealth: Payer: Self-pay

## 2024-09-02 NOTE — Telephone Encounter (Signed)
 Your request has been approved Request Reference Number: [id number]. BREZTRI  AERO AER SPHERE is approved through 09/02/2025. For further questions, call [employer] at [phone]. Authorization Expiration11/01/2025

## 2024-09-02 NOTE — Telephone Encounter (Signed)
*  Pulm  Pharmacy Patient Advocate Encounter   Received notification from Fax that prior authorization for Breztri  Aerosphere 160-9-4.8MCG/ACT aerosol  is required/requested.   Insurance verification completed.   The patient is insured through [facility].   Per test claim: PA required; PA submitted to above mentioned insurance via Latent Key/confirmation #/EOC [id number] Status is pending

## 2024-09-05 ENCOUNTER — Encounter: Payer: Self-pay | Admitting: Internal Medicine

## 2024-09-05 ENCOUNTER — Ambulatory Visit: Attending: Internal Medicine | Admitting: Internal Medicine

## 2024-09-05 DIAGNOSIS — E782 Mixed hyperlipidemia: Secondary | ICD-10-CM | POA: Diagnosis present

## 2024-09-05 MED ORDER — ROSUVASTATIN CALCIUM 40 MG PO TABS: 40.0000 mg | ORAL_TABLET | Freq: Every day | ORAL | 3 refills | Status: AC

## 2024-09-05 NOTE — Patient Instructions (Signed)
 Medication Instructions:  Your physician recommends that you continue on your current medications as directed. Please refer to the Current Medication list given to you today.  *If you need a refill on your cardiac medications before your next appointment, please call your pharmacy*  Lab Work: TODAY: vitamin D , NMR, LP(a) If you have labs (blood work) drawn today and your tests are completely normal, you will receive your results only by: MyChart Message (if you have MyChart) OR A paper copy in the mail If you have any lab test that is abnormal or we need to change your treatment, we will call you to review the results.  Follow-Up: At [facility], you and your health needs are our priority.  As part of our continuing mission to provide you with exceptional heart care, our providers are all part of one team.  This team includes your primary Cardiologist (physician) and Advanced Practice Providers or APPs (Physician Assistants and Nurse Practitioners) who all work together to provide you with the care you need, when you need it.  Your next appointment:   9 month(s)  Provider:   Vina Gull, MD  We recommend signing up for the patient portal called MyChart.  Sign up information is provided on this After Visit Summary.  MyChart is used to connect with patients for Virtual Visits (Telemedicine).  Patients are able to view lab/test results, encounter notes, upcoming appointments, etc.  Non-urgent messages can be sent to your provider as well.    To learn more about what you can do with MyChart, go to [email or web address].   Other Instructions

## 2024-09-07 ENCOUNTER — Ambulatory Visit: Payer: Self-pay | Admitting: Internal Medicine

## 2024-09-07 DIAGNOSIS — E782 Mixed hyperlipidemia: Secondary | ICD-10-CM

## 2024-09-07 DIAGNOSIS — Z79899 Other long term (current) drug therapy: Secondary | ICD-10-CM

## 2024-09-08 ENCOUNTER — Encounter: Payer: Self-pay | Admitting: Adult Health

## 2024-09-08 ENCOUNTER — Ambulatory Visit: Admitting: Adult Health

## 2024-09-08 DIAGNOSIS — F17210 Nicotine dependence, cigarettes, uncomplicated: Secondary | ICD-10-CM | POA: Diagnosis not present

## 2024-09-08 NOTE — Progress Notes (Signed)
  Virtual Visit via Telephone Note  I connected with Kaitlyn [name] , 09/08/24 9:19 AM by a telemedicine application and verified that I am speaking with the correct person using two identifiers.  Location: Patient: home Provider: home   I discussed the limitations of evaluation and management by telemedicine and the availability of in person appointments. The patient expressed understanding and agreed to proceed.   Shared Decision Making Visit Lung Cancer Screening Program [phone])   Eligibility: 58 y.o. Pack Years Smoking History Calculation = 55 pack years  (# packs/per year x # years smoked) Recent History of coughing up blood  no Unexplained weight loss? no ( >Than 15 pounds within the last 6 months ) Prior History Lung / other cancer no (Diagnosis within the last 5 years already requiring surveillance chest CT Scans). Smoking Status Current Smoker  Visit Components: Discussion included one or more decision making aids. YES Discussion included risk/benefits of screening. YES Discussion included potential follow up diagnostic testing for abnormal scans. YES Discussion included meaning and risk of over diagnosis. YES Discussion included meaning and risk of False Positives. YES Discussion included meaning of total radiation exposure. YES  Counseling Included: Importance of adherence to annual lung cancer LDCT screening. YES Impact of comorbidities on ability to participate in the program. YES Ability and willingness to under diagnostic treatment. YES  Smoking Cessation Counseling: Current Smokers:  Discussed importance of smoking cessation. yes Information about tobacco cessation classes and interventions provided to patient. yes Patient provided with ticket for LDCT Scan. yes Symptomatic Patient. NO Diagnosis Code: Tobacco Use Z72.0 Asymptomatic Patient yes  Counseling - 4 minutes of smoking cessation counseling (CT Chest Lung Cancer Screening Low Dose W/O CM)  [id number]  Z12.2-Screening of respiratory organs Z87.891-Personal history of nicotine dependence   Lamarr Myers 09/08/24

## 2024-09-08 NOTE — Patient Instructions (Signed)

## 2024-09-09 ENCOUNTER — Ambulatory Visit
Admission: RE | Admit: 2024-09-09 | Discharge: 2024-09-09 | Disposition: A | Discharge status: Home / Self Care | Source: Ambulatory Visit | Attending: Acute Care | Admitting: Acute Care

## 2024-09-09 DIAGNOSIS — F17210 Nicotine dependence, cigarettes, uncomplicated: Secondary | ICD-10-CM

## 2024-09-09 DIAGNOSIS — Z122 Encounter for screening for malignant neoplasm of respiratory organs: Secondary | ICD-10-CM

## 2024-09-09 DIAGNOSIS — Z87891 Personal history of nicotine dependence: Secondary | ICD-10-CM

## 2024-09-11 ENCOUNTER — Other Ambulatory Visit: Payer: Self-pay

## 2024-09-11 DIAGNOSIS — E782 Mixed hyperlipidemia: Secondary | ICD-10-CM

## 2024-09-11 DIAGNOSIS — Z79899 Other long term (current) drug therapy: Secondary | ICD-10-CM

## 2024-09-12 LAB — NMR, LIPOPROFILE
Cholesterol, Total: 180 mg/dL (ref 100–199)
HDL Particle Number: 39.2 umol/L (ref >=30.5)
HDL-C: 55 mg/dL (ref >39)
LDL Particle Number: 1460 nmol/L — ABNORMAL HIGH (ref <1000)
LDL Size: 21.3 nm (ref >20.5)
LDL-C (NIH Calc): 107 mg/dL — ABNORMAL HIGH (ref 0–99)
LP-IR Score: 52 — ABNORMAL HIGH (ref <=45)
Small LDL Particle Number: 683 nmol/L — ABNORMAL HIGH (ref <=527)
Triglycerides: 98 mg/dL (ref 0–149)

## 2024-09-12 LAB — VITAMIN D, 25-HYDROXY, TOTAL: Vitamin D, 25-Hydroxy, Serum: 63 ng/mL

## 2024-09-12 LAB — LIPOPROTEIN A (LPA): Lipoprotein (a): 200.9 nmol/L — ABNORMAL HIGH (ref <75.0)

## 2024-09-15 ENCOUNTER — Other Ambulatory Visit: Payer: Self-pay | Admitting: Acute Care

## 2024-09-15 DIAGNOSIS — F17210 Nicotine dependence, cigarettes, uncomplicated: Secondary | ICD-10-CM

## 2024-09-15 DIAGNOSIS — Z122 Encounter for screening for malignant neoplasm of respiratory organs: Secondary | ICD-10-CM

## 2024-09-15 DIAGNOSIS — Z87891 Personal history of nicotine dependence: Secondary | ICD-10-CM

## 2024-09-18 ENCOUNTER — Ambulatory Visit (INDEPENDENT_AMBULATORY_CARE_PROVIDER_SITE_OTHER): Payer: Self-pay | Admitting: Family Medicine

## 2024-09-18 ENCOUNTER — Other Ambulatory Visit: Payer: Self-pay | Admitting: Nurse Practitioner

## 2024-09-18 DIAGNOSIS — I10 Essential (primary) hypertension: Secondary | ICD-10-CM

## 2024-09-18 MED ORDER — AMLODIPINE BESYLATE 5 MG PO TABS: 5.0000 mg | ORAL_TABLET | Freq: Every day | ORAL | 1 refills | Status: AC

## 2024-09-18 NOTE — Telephone Encounter (Signed)
 Copied from CRM #[id number]. Topic: General - Other >> Sep 18, 2024 11:55 AM Debby BROCKS wrote: Reason for CRM: Patient called in for a refill but also just wanted to pass on the information over to her PCP. Ever since starting the new blood pressure medication her readings are: 155/84 After pill 139/77

## 2024-09-18 NOTE — Telephone Encounter (Signed)
 Copied from CRM #[id number]. Topic: Clinical - Medication Refill >> Sep 18, 2024 11:53 AM Debby BROCKS wrote: Medication: amLODipine (NORVASC) 5 MG tablet  Has the patient contacted their pharmacy? Yes (Agent: If no, request that the patient contact the pharmacy for the refill. If patient does not wish to contact the pharmacy document the reason why and proceed with request.) (Agent: If yes, when and what did the pharmacy advise?)  Patient also wanted to state that she spoke to heart doctor and was advised that its okay to use this medication  This is the patient's preferred pharmacy:  [id number] - [place], [place] - [address] AT [facility] [address] [place] [address] Phone: [phone] Fax: [phone]  Is this the correct pharmacy for this prescription? Yes If no, delete pharmacy and type the correct one.   Has the prescription been filled recently? No  Is the patient out of the medication? Yes  Has the patient been seen for an appointment in the last year OR does the patient have an upcoming appointment? Yes  Can we respond through MyChart? Yes  Agent: Please be advised that Rx refills may take up to 3 business days. We ask that you follow-up with your pharmacy.

## 2024-09-18 NOTE — Telephone Encounter (Signed)
 Please advise that we will keep medication the same

## 2024-09-19 ENCOUNTER — Other Ambulatory Visit (HOSPITAL_COMMUNITY): Payer: Self-pay

## 2024-09-19 ENCOUNTER — Telehealth: Payer: Self-pay | Admitting: Pharmacy Technician

## 2024-09-19 NOTE — Telephone Encounter (Signed)
 Pharmacy Patient Advocate Encounter   Received notification from Physician's Office that prior authorization for Repatha is required/requested.   Insurance verification completed.   The patient is insured through [employer].   Per test claim: The current 09/19/24 day co-pay is, $4.80- one month.  No PA needed at this time. This test claim was processed through [facility]- copay amounts may vary at other pharmacies due to pharmacy/plan contracts, or as the patient moves through the different stages of their insurance plan.

## 2024-10-02 ENCOUNTER — Institutional Professional Consult (permissible substitution) (INDEPENDENT_AMBULATORY_CARE_PROVIDER_SITE_OTHER): Admitting: Otolaryngology

## 2024-10-02 ENCOUNTER — Other Ambulatory Visit: Payer: Self-pay | Admitting: Nurse Practitioner

## 2024-10-02 DIAGNOSIS — F17200 Nicotine dependence, unspecified, uncomplicated: Secondary | ICD-10-CM

## 2024-10-02 DIAGNOSIS — F321 Major depressive disorder, single episode, moderate: Secondary | ICD-10-CM

## 2024-10-02 DIAGNOSIS — F3341 Major depressive disorder, recurrent, in partial remission: Secondary | ICD-10-CM

## 2024-10-02 NOTE — Telephone Encounter (Signed)
 Copied from CRM #[id number]. Topic: Clinical - Medication Refill >> Oct 02, 2024 11:33 AM Vivian Z wrote: Medication: buPROPion  (WELLBUTRIN  SR) 150 MG 12 hr tablet sertraline  (ZOLOFT ) 25 MG tablet losartan -hydrochlorothiazide (HYZAAR) 100-12.5 MG tablet  Varenicline  Tartrate, Starter, (CHANTIX  STARTING MONTH PAK) 0.5 MG X 11 & 1 MG X 42 TBPK  Has the patient contacted their pharmacy? No (Agent: If no, request that the patient contact the pharmacy for the refill. If patient does not wish to contact the pharmacy document the reason why and proceed with request.) (Agent: If yes, when and what did the pharmacy advise?)  This is the patient's preferred pharmacy:  [id number] - [place], [place] - [address] AT [facility] OF [address] [place] [address] Phone: [phone] Fax: [phone]  Is this the correct pharmacy for this prescription? Yes If no, delete pharmacy and type the correct one.   Has the prescription been filled recently? No  Is the patient out of the medication? No  Has the patient been seen for an appointment in the last year OR does the patient have an upcoming appointment? Yes  Can we respond through MyChart? Yes  Agent: Please be advised that Rx refills may take up to 3 business days. We ask that you follow-up with your pharmacy.

## 2024-10-03 MED ORDER — VARENICLINE TARTRATE (STARTER) 0.5 MG X 11 & 1 MG X 42 PO TBPK: ORAL_TABLET | ORAL | 0 refills | Status: DC

## 2024-10-03 MED ORDER — BUPROPION HCL ER (SR) 150 MG PO TB12: 150.0000 mg | ORAL_TABLET | Freq: Two times a day (BID) | ORAL | 5 refills | Status: AC

## 2024-10-03 MED ORDER — SERTRALINE HCL 25 MG PO TABS: 25.0000 mg | ORAL_TABLET | Freq: Every day | ORAL | 1 refills | Status: AC

## 2024-10-03 MED ORDER — LOSARTAN POTASSIUM-HCTZ 100-12.5 MG PO TABS: 1.0000 | ORAL_TABLET | Freq: Every day | ORAL | 1 refills | Status: AC

## 2024-10-03 NOTE — Telephone Encounter (Signed)
 Patient requested refill  Pended Rx's and sent to [facility] for approval.

## 2024-10-06 ENCOUNTER — Telehealth: Payer: Self-pay | Admitting: *Deleted

## 2024-10-06 DIAGNOSIS — F17200 Nicotine dependence, unspecified, uncomplicated: Secondary | ICD-10-CM

## 2024-10-06 MED ORDER — VARENICLINE TARTRATE 1 MG PO TABS: 1.0000 mg | ORAL_TABLET | Freq: Two times a day (BID) | ORAL | 5 refills | Status: AC

## 2024-10-06 NOTE — Telephone Encounter (Signed)
 New Rx sent.

## 2024-10-06 NOTE — Telephone Encounter (Signed)
 Copied from CRM [id number]. Topic: Clinical - Prescription Issue >> Oct 06, 2024 11:28 AM Kaitlyn [name] wrote: Reason for CRM: Patient is calling in stating that she needs the 1 Mg of the continuation of the starter pack of Varenicline  Tartrate, Starter, (CHANTIX  STARTING MONTH PAK) 0.5 MG X 11 & 1 MG X 42 TBPK [[id number]]. The starter pack was initially called in again but she just needs the 1MG  that she takes after the starter pack.   PENDED RX for Approval.

## 2024-10-13 ENCOUNTER — Ambulatory Visit (INDEPENDENT_AMBULATORY_CARE_PROVIDER_SITE_OTHER): Admitting: Family Medicine

## 2024-10-13 ENCOUNTER — Encounter (INDEPENDENT_AMBULATORY_CARE_PROVIDER_SITE_OTHER): Payer: Self-pay | Admitting: Family Medicine

## 2024-10-13 VITALS — BP 162/78 | HR 82 | Temp 98.7°F | Ht 66.0 in | Wt 287.0 lb

## 2024-10-13 DIAGNOSIS — I10 Essential (primary) hypertension: Secondary | ICD-10-CM

## 2024-10-13 DIAGNOSIS — M160 Bilateral primary osteoarthritis of hip: Secondary | ICD-10-CM

## 2024-10-13 DIAGNOSIS — Z6841 Body Mass Index (BMI) 40.0 and over, adult: Secondary | ICD-10-CM

## 2024-10-13 NOTE — Assessment & Plan Note (Signed)
 Patient did not take BP medications today.  She mentions that she is monitoring her BP at home with a wrist cuff.  She is going to bring her wrist cuff to next appointment.

## 2024-10-13 NOTE — Progress Notes (Signed)
" ° °  SUBJECTIVE:  Chief Complaint: Obesity  Interim History: Patient is awaiting referral to get aqua PT.  She would like to see faster weight loss.  Food wise she has been getting in a few more candybars.  She mentions that she can't always get all the protein in.  She didn't overeat on Thanksgiving but did try to stay mindful of food intake.  She didn't have seconds.  Over the next few weeks she is planning to have ham for Christmas and then go to [place]  for Christmas.   Kaitlyn [name] is here to discuss her progress with her obesity treatment plan. She is on the Category 3 Plan and states she is following her eating plan approximately 40 % of the time. She states she is not exercising much.   OBJECTIVE: Visit Diagnoses: Problem List Items Addressed This Visit       Cardiovascular and Mediastinum   Hypertension - Primary     Musculoskeletal and Integument   Primary osteoarthritis of both hips   Relevant Orders   Ambulatory referral to Physical Therapy     Other   Morbid obesity (HCC)   Other Visit Diagnoses       BMI 45.0-49.9, adult (HCC)           Vitals Temp: 98.7 F (37.1 C) BP: (!) 162/78 Pulse Rate: 82 SpO2: 99 %   Anthropometric Measurements Height: 5' 6 (1.676 m) Weight: 287 lb (130.2 kg) BMI (Calculated): 46.35 Weight at Last Visit: 283 lb Weight Lost Since Last Visit: 0 Weight Gained Since Last Visit: 4 Starting Weight: 312 lb Total Weight Loss (lbs): 25 lb (11.3 kg)   Body Composition  Body Fat %: 52.3 % Fat Mass (lbs): 150.2 lbs Muscle Mass (lbs): 130 lbs Total Body Water (lbs): 88.6 lbs Visceral Fat Rating : 19   Other Clinical Data Today's Visit #: 4 Starting Date: 04/22/24 Comments: Cat 3     ASSESSMENT AND PLAN: Assessment & Plan Primary hypertension Patient did not take BP medications today.  She mentions that she is monitoring her BP at home with a wrist cuff.  She is going to bring her wrist cuff to next appointment. Primary  osteoarthritis of both hips Patient is open for physical therapy for strengthening and range of motion exercises for her hips.  Referral to physical therapy sent in today.  Will follow-up at next appointment to see when patient will start physical therapy. BMI 45.0-49.9, adult [facility])  Morbid obesity (HCC)    Diet: [name] is currently in the action stage of change. As such, her goal is to continue with weight loss efforts and has agreed to the Category 3 Plan.   Exercise:  All adults should avoid inactivity. Some activity is better than none, and adults who participate in any amount of physical activity, gain some health benefits.  Behavior Modification:  We discussed the following Behavioral Modification Strategies today: increasing lean protein intake, meal planning and cooking strategies, travel eating strategies, holiday eating strategies, and planning for success.   Return in about 6 weeks (around 11/24/2024).   She was informed of the importance of frequent follow up visits to maximize her success with intensive lifestyle modifications for her multiple health conditions.  Attestation Statements:   Reviewed by clinician on day of visit: allergies, medications, problem list, medical history, surgical history, family history, social history, and previous encounter notes.  Adelita Cho, MD "

## 2024-10-26 NOTE — Assessment & Plan Note (Signed)
 Patient is open for physical therapy for strengthening and range of motion exercises for her hips.  Referral to physical therapy sent in today.  Will follow-up at next appointment to see when patient will start physical therapy.

## 2024-11-04 ENCOUNTER — Institutional Professional Consult (permissible substitution) (INDEPENDENT_AMBULATORY_CARE_PROVIDER_SITE_OTHER): Admitting: Otolaryngology

## 2024-11-18 ENCOUNTER — Ambulatory Visit (INDEPENDENT_AMBULATORY_CARE_PROVIDER_SITE_OTHER): Admitting: Family Medicine

## 2024-12-15 ENCOUNTER — Ambulatory Visit (HOSPITAL_BASED_OUTPATIENT_CLINIC_OR_DEPARTMENT_OTHER): Admitting: Physical Therapy

## 2024-12-22 ENCOUNTER — Encounter (HOSPITAL_BASED_OUTPATIENT_CLINIC_OR_DEPARTMENT_OTHER): Admitting: Physical Therapy

## 2024-12-29 ENCOUNTER — Encounter (HOSPITAL_BASED_OUTPATIENT_CLINIC_OR_DEPARTMENT_OTHER): Admitting: Physical Therapy

## 2025-01-05 ENCOUNTER — Encounter (HOSPITAL_BASED_OUTPATIENT_CLINIC_OR_DEPARTMENT_OTHER): Admitting: Physical Therapy

## 2025-02-13 ENCOUNTER — Ambulatory Visit: Admitting: Nurse Practitioner
